# Patient Record
Sex: Female | Born: 2000 | Race: White | Hispanic: No | Marital: Single | Smoking: Never smoker
Health system: Southern US, Community
[De-identification: ages and names within clinical notes are randomized; demographics above are authoritative.]

## PROBLEM LIST (undated history)

## (undated) DIAGNOSIS — G43909 Migraine, unspecified, not intractable, without status migrainosus: Secondary | ICD-10-CM

## (undated) DIAGNOSIS — M6289 Other specified disorders of muscle: Secondary | ICD-10-CM

## (undated) DIAGNOSIS — J45909 Unspecified asthma, uncomplicated: Secondary | ICD-10-CM

## (undated) DIAGNOSIS — N390 Urinary tract infection, site not specified: Secondary | ICD-10-CM

## (undated) DIAGNOSIS — F419 Anxiety disorder, unspecified: Secondary | ICD-10-CM

## (undated) DIAGNOSIS — K219 Gastro-esophageal reflux disease without esophagitis: Secondary | ICD-10-CM

## (undated) DIAGNOSIS — F329 Major depressive disorder, single episode, unspecified: Secondary | ICD-10-CM

## (undated) DIAGNOSIS — F32A Depression, unspecified: Secondary | ICD-10-CM

## (undated) DIAGNOSIS — F319 Bipolar disorder, unspecified: Secondary | ICD-10-CM

## (undated) HISTORY — PX: UPPER GI ENDOSCOPY: SHX6162

---
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
## 2018-01-08 ENCOUNTER — Encounter: Payer: Self-pay | Admitting: Physical Therapy

## 2018-01-08 ENCOUNTER — Ambulatory Visit: Payer: Medicaid Other | Admitting: Physical Therapy

## 2018-01-08 DIAGNOSIS — M6281 Muscle weakness (generalized): Secondary | ICD-10-CM | POA: Diagnosis not present

## 2018-01-08 DIAGNOSIS — R293 Abnormal posture: Secondary | ICD-10-CM

## 2018-01-08 DIAGNOSIS — R252 Cramp and spasm: Secondary | ICD-10-CM

## 2018-01-08 NOTE — Therapy (Signed)
[facility] [address], [address] [place], [place], [address] Phone: [phone]   Fax:  [phone]  Physical Therapy Treatment  Patient Details  Name: Alexis [name] MRN: [id number] Date of Birth: 11/03/2000 Referring Provider[facility]   Encounter Date: 01/08/2018  PT End of Session - 01/08/18 1058    Visit Number  3    Date for PT Re-Evaluation  02/06/18    Authorization Type  Medicaid    Authorization Time Period  12/17/2017-02/10/2018     Authorization - Visit Number  3    Authorization - Number of Visits  18    PT Start Time  1015    PT Stop Time  1055    PT Time Calculation (min)  40 min    Activity Tolerance  Patient tolerated treatment well    Behavior During Therapy  [facility] for tasks assessed/performed       History reviewed. No pertinent past medical history.  History reviewed. No pertinent surgical history.  There were no vitals filed for this visit.  Subjective Assessment - 01/08/18 1021    Subjective  I feel better.  I have less burning. I do not have to take the periduim for awhile.  My back pain is 60% better.  Still have abdomenal pain.     Patient Stated Goals  reduce pain    Currently in Pain?  Yes    Pain Score  8    low is 4/10   Pain Location  Abdomen    Pain Orientation  Mid    Pain Descriptors / Indicators  Aching    Pain Type  Chronic pain    Pain Onset  More than a month ago    Pain Frequency  Intermittent    Aggravating Factors   sitting for long for periods, eating acidic foods    Pain Relieving Factors  heat, mediation, diet    Multiple Pain Sites  No                       [facility] Adult PT Treatment/Exercise - 01/08/18 0001      Therapeutic Activites    Therapeutic Activities  Other Therapeutic Activities    Other Therapeutic Activities  toileting technique to relax the pelvic floor      Neuro Re-ed    Neuro Re-ed Details   diaphragmatic breathing with tactile  cues to expand the abdomen in sitting and supine to relax the pelvic floor      Manual Therapy   Manual Therapy  Soft tissue mobilization;Myofascial release    Soft tissue mobilization  circular massage to the abdomen    Myofascial Release  release through the 3 planes of fascia  for diaphgram, mid abdominal and urogenital             PT Education - 01/08/18 1058    Education Details  diaphgramatic breathing, toileting technique    Person(s) Educated  Patient    Methods  Explanation;Demonstration;Verbal cues;Handout    Comprehension  Verbalized understanding;Returned demonstration       PT Short Term Goals - 01/08/18 1024      PT SHORT TERM GOAL #1   Title  independent with initial HEP    Time  4    Period  Weeks    Status  Achieved      PT SHORT TERM GOAL #2   Title  understand how to toilet correctly to reduce strain on  the pelvic floor    Time  4    Period  Weeks    Status  Achieved        PT Long Term Goals - 12/12/17 0910      PT LONG TERM GOAL #1   Title  independent with HEP    Time  8    Period  Weeks    Status  New    Target Date  02/06/18      PT LONG TERM GOAL #2   Title  reduce pain with vaginal penetration with exam and penile </= 3/10    Baseline  pain level 6/10    Time  8    Period  Weeks    Status  New    Target Date  02/06/18      PT LONG TERM GOAL #3   Title  burining with urination decreased >/= 75% due to ability to relax the pelvic floor    Time  8    Period  Weeks    Status  New    Target Date  02/06/18      PT LONG TERM GOAL #4   Title  reduce straining with bowel movement >/= 75% due to relaxation of pelvic floor muscles    Time  8    Period  Weeks    Status  New    Target Date  02/06/18            Plan - 01/08/18 1032    Clinical Impression Statement  Patient reports sitting for long periods is 40% better.  Patient reports burning with urination is 70% better.  After therapy the abdominal pain decreased from 6/10  to 5/10.  Patient has alot of tenderness and tightness in the suprapubic area.  Patient is doing her HEP.  Patient needed many verbal and tactile cues to perform expansion of her abdomen during diaphgramatic breathing.  Patient will benefi tfrom skilled therapy to reduce pain, rlax pelvic floor muscles and improve core strength.     Rehab Potential  Excellent    Clinical Impairments Affecting Rehab Potential  none    PT Frequency  2x / week    PT Duration  8 weeks    PT Treatment/Interventions  Biofeedback;Cryotherapy;Electrical Stimulation;Moist Heat;Ultrasound;Therapeutic exercise;Therapeutic activities;Neuromuscular re-education;Patient/family education;Manual techniques;Dry needling    PT Next Visit Plan  abdominal soft tissue work over the bladder; ; postural education    PT Home Exercise Plan  ACCESS [facility]: [email or web address]    Consulted and Agree with Plan of Care  Patient       Patient will benefit from skilled therapeutic intervention in order to improve the following deficits and impairments:  Increased fascial restricitons, Pain, Decreased mobility, Increased muscle spasms, Postural dysfunction, Decreased activity tolerance, Decreased range of motion, Decreased strength  Visit Diagnosis: Muscle weakness (generalized)  Cramp and spasm  Abnormal posture     Problem List There are no active problems to display for this patient.   Eulis FosterCheryl Alamin Mccuiston, PT 01/08/18 11:02 AM   [facility] [address], [address] [place], [place], [address] Phone: [phone]   Fax:  [phone]  Name: Alexis [name] MRN: [id number] Date of Birth: 11/26/2000

## 2018-01-08 NOTE — Patient Instructions (Addendum)
About Abdominal Massage  Abdominal massage, also called external colon massage, is a self-treatment circular massage technique that can reduce and eliminate gas and ease constipation. The colon naturally contracts in waves in a clockwise direction starting from inside the right hip, moving up toward the ribs, across the belly, and down inside the left hip.  When you perform circular abdominal massage, you help stimulate your colon's normal wave pattern of movement called peristalsis.  It is most beneficial when done after eating.  Positioning You can practice abdominal massage with oil while lying down, or in the shower with soap.  Some people find that it is just as effective to do the massage through clothing while sitting or standing.  How to Massage Start by placing your finger tips or knuckles on your right side, just inside your hip bone.  . Make small circular movements while you move upward toward your rib cage.   . Once you reach the bottom right side of your rib cage, take your circular movements across to the left side of the bottom of your rib cage.  . Next, move downward until you reach the inside of your left hip bone.  This is the path your feces travel in your colon. . Continue to perform your abdominal massage in this pattern for 10 minutes each day.     You can apply as much pressure as is comfortable in your massage.  Start gently and build pressure as you continue to practice.  Notice any areas of pain as you massage; areas of slight pain may be relieved as you massage, but if you have areas of significant or intense pain, consult with your healthcare provider.  Other Considerations . General physical activity including bending and stretching can have a beneficial massage-like effect on the colon.  Deep breathing can also stimulate the colon because breathing deeply activates the same nervous system that supplies the colon.   . Abdominal massage should always be used in  combination with a bowel-conscious diet that is high in the proper type of fiber for you, fluids (primarily water), and a regular exercise program. [facility] [address], [address] [place], [place] [address] Phone # [phone] Fax [phone]  

## 2018-01-12 ENCOUNTER — Encounter: Payer: Medicaid Other | Admitting: Physical Therapy

## 2018-01-14 ENCOUNTER — Encounter: Payer: Medicaid Other | Admitting: Physical Therapy

## 2018-01-15 ENCOUNTER — Encounter: Payer: Medicaid Other | Admitting: Physical Therapy

## 2018-01-19 ENCOUNTER — Encounter: Payer: Self-pay | Admitting: Physical Therapy

## 2018-01-19 ENCOUNTER — Encounter: Payer: Medicaid Other | Admitting: Physical Therapy

## 2018-01-19 ENCOUNTER — Ambulatory Visit: Payer: Medicaid Other | Admitting: Physical Therapy

## 2018-01-19 DIAGNOSIS — M6281 Muscle weakness (generalized): Secondary | ICD-10-CM | POA: Diagnosis not present

## 2018-01-19 DIAGNOSIS — R293 Abnormal posture: Secondary | ICD-10-CM

## 2018-01-19 DIAGNOSIS — R252 Cramp and spasm: Secondary | ICD-10-CM

## 2018-01-19 NOTE — Therapy (Signed)
[facility] [address], [address] [place], [place], [address] Phone: [phone]   Fax:  [phone]  Physical Therapy Treatment  Patient Details  Name: Ladoris GeneCasey M Redfield MRN: [id number] Date of Birth: 04/01/2001 Referring Provider: Melanee [facility]Brie Fleck [facility]   Encounter Date: 01/19/2018  PT End of Session - 01/19/18 1514    Visit Number  4    Date for PT Re-Evaluation  02/06/18    Authorization Type  Medicaid    Authorization Time Period  12/17/2017-02/10/2018     Authorization - Visit Number  4    Authorization - Number of Visits  18    PT Start Time  1445    PT Stop Time  1523    PT Time Calculation (min)  38 min    Activity Tolerance  Patient tolerated treatment well    Behavior During Therapy  [facility] for tasks assessed/performed       History reviewed. No pertinent past medical history.  History reviewed. No pertinent surgical history.  There were no vitals filed for this visit.  Subjective Assessment - 01/19/18 1448    Subjective  I have not had stomach pain.  I am able to poop better with the exercises. Main problem is pain with intercourse and burning with urination.     Patient Stated Goals  reduce pain    Currently in Pain?  No/denies    Multiple Pain Sites  No         [facility] PT Assessment - 01/19/18 0001      Strength   Right Hip ABduction  3/5    Left Hip ABduction  3/5                   [facility] Adult PT Treatment/Exercise - 01/19/18 0001      Knee/Hip Exercises: Sidelying   Hip ABduction  Strengthening;Right;Left;1 set;10 reps;Limitations    Hip ABduction Limitations  tactile cues to prevent hip rolling backward      Shoulder Exercises: Supine   Horizontal ABduction  Strengthening;Both;15 reps;Theraband    Theraband Level (Shoulder Horizontal ABduction)  Level 1 (Yellow)    Horizontal ABduction Limitations  tried sitting but compensated too much      Manual Therapy   Manual Therapy  Myofascial  release    Myofascial Release  release through the 3 planes of fascia  for, mid abdominal and urogenital             PT Education - 01/19/18 1513    Education Details  interscapular strength for posure    Person(s) Educated  Patient    Methods  Explanation;Demonstration;Verbal cues;Handout    Comprehension  Verbalized understanding;Returned demonstration       PT Short Term Goals - 01/08/18 1024      PT SHORT TERM GOAL #1   Title  independent with initial HEP    Time  4    Period  Weeks    Status  Achieved      PT SHORT TERM GOAL #2   Title  understand how to toilet correctly to reduce strain on the pelvic floor    Time  4    Period  Weeks    Status  Achieved        PT Long Term Goals - 01/19/18 1449      PT LONG TERM GOAL #1   Title  independent with HEP    Baseline  still learning    Time  8  Period  Weeks    Status  On-going      PT LONG TERM GOAL #2   Title  reduce pain with vaginal penetration with exam and penile </= 3/10    Baseline  pain level 2/10    Time  8    Period  Weeks    Status  Achieved      PT LONG TERM GOAL #3   Title  burining with urination decreased >/= 75% due to ability to relax the pelvic floor    Baseline  burning pain 3-4/10; depends on what she eats; 50% better    Time  8    Period  Weeks    Status  On-going      PT LONG TERM GOAL #4   Title  reduce straining with bowel movement >/= 75% due to relaxation of pelvic floor muscles    Time  8    Period  Weeks    Status  Achieved            Plan - 01/19/18 1514    Clinical Impression Statement  Bilateral hip abduction strength is 3/5 and is learning exercises to increase strength. Burning with urination is 50% better and it depends on her foods. Patient is able to use the exercises to have bowel movments without straining.  Patient is doing better with breathing  in filling up with air in supine and sitting down.   Patient will benefit frim skilled therapy to reduce  pain , relax pelvic floor muscles and imporve core sttength.     Rehab Potential  Excellent    Clinical Impairments Affecting Rehab Potential  none    PT Frequency  2x / week    PT Duration  8 weeks    PT Treatment/Interventions  Biofeedback;Cryotherapy;Electrical Stimulation;Moist Heat;Ultrasound;Therapeutic exercise;Therapeutic activities;Neuromuscular re-education;Patient/family education;Manual techniques;Dry needling    PT Next Visit Plan  hip abduction strength; bridge with knees out    PT Home Exercise Plan  ACCESS CODE: [facility]    Consulted and Agree with Plan of Care  Patient       Patient will benefit from skilled therapeutic intervention in order to improve the following deficits and impairments:  Increased fascial restricitons, Pain, Decreased mobility, Increased muscle spasms, Postural dysfunction, Decreased activity tolerance, Decreased range of motion, Decreased strength  Visit Diagnosis: Muscle weakness (generalized)  Cramp and spasm  Abnormal posture     Problem List There are no active problems to display for this patient.   Eulis Foster, PT 01/19/18 3:23 PM   [facility] [address], [address] [place], [place], [address] Phone: [phone]   Fax:  [phone]  Name: HESTER JOSLIN MRN: [id number] Date of Birth: 08/06/2000

## 2018-01-19 NOTE — Patient Instructions (Addendum)
Sash    On back, knees bent, feet flat, left hand on left hip, right hand above left. Pull right arm DIAGONALLY (hip to shoulder) across chest. Bring right arm along head toward floor. Hold momentarily. Slowly return to starting position. Repeat _10__ times each way. Do with left arm. Band color __yellow____   Copyright  VHI. All rights reserved.   Side Pull: Double Arm    On back, knees bent, feet flat. Arms perpendicular to body, shoulder level, elbows straight but relaxed. Pull arms out to sides, elbows straight. Resistance band comes across collarbones, hands toward floor. Hold momentarily. Slowly return to starting position. Repeat _10__ times. Band color _yellow band____    Copyright  VHI. All rights reserved.  Over Head Pull: Wide Grip    On back, knees bent, feet flat, band across thighs, elbows straight but relaxed. Pull hands apart (start). Keeping elbows straight, bring arms up and over head, hands toward floor. Keep steady pull on band. Hold momentarily. Return slowly, keeping pull steady, back to start. Repeat _10__ times. Band color _yellow_____    Copyright  VHI. All rights reserved.  [facility] [address], [address] [place], [place] [address] Phone # [phone] Fax [phone]

## 2018-01-20 ENCOUNTER — Encounter: Payer: Medicaid Other | Admitting: Physical Therapy

## 2018-01-21 ENCOUNTER — Encounter: Payer: Medicaid Other | Admitting: Physical Therapy

## 2018-01-28 ENCOUNTER — Encounter: Payer: Self-pay | Admitting: Physical Therapy

## 2018-01-28 ENCOUNTER — Ambulatory Visit: Payer: Medicaid Other | Attending: Urology | Admitting: Physical Therapy

## 2018-01-28 DIAGNOSIS — M6281 Muscle weakness (generalized): Secondary | ICD-10-CM | POA: Insufficient documentation

## 2018-01-28 DIAGNOSIS — R252 Cramp and spasm: Secondary | ICD-10-CM | POA: Diagnosis present

## 2018-01-28 DIAGNOSIS — R293 Abnormal posture: Secondary | ICD-10-CM | POA: Diagnosis present

## 2018-01-28 NOTE — Therapy (Signed)
[facility] [address], [address] [place], [place], [address] Phone: [phone]   Fax:  [phone]  Physical Therapy Treatment  Patient Details  Name: Alexis [name] MRN: [id number] Date of Birth: 2000/06/15 Referring Provider: Melanee Spry [facility]   Encounter Date: 01/28/2018  PT End of Session - 01/28/18 1614    Visit Number  5    Date for PT Re-Evaluation  02/06/18    Authorization Type  Medicaid    Authorization Time Period  12/17/2017-02/10/2018     Authorization - Visit Number  5    Authorization - Number of Visits  18    PT Start Time  1530    PT Stop Time  1612    PT Time Calculation (min)  42 min    Activity Tolerance  Patient tolerated treatment well    Behavior During Therapy  [facility] for tasks assessed/performed       History reviewed. No pertinent past medical history.  History reviewed. No pertinent surgical history.  There were no vitals filed for this visit.  Subjective Assessment - 01/28/18 1534    Subjective  I had alot of burning and I have not stuck to my diet. Sitting with 60% better pain.     Patient Stated Goals  reduce pain    Currently in Pain?  Yes    Pain Score  7     Pain Orientation  Mid    Pain Descriptors / Indicators  Aching    Pain Type  Chronic pain    Pain Onset  More than a month ago    Pain Frequency  Intermittent    Aggravating Factors   sitting for long periods of time    Pain Relieving Factors  heat, medication, diet    Multiple Pain Sites  No         [facility] PT Assessment - 01/28/18 0001      Strength   Right Hip Flexion  4+/5    Right Hip External Rotation   4/5    Right Hip Internal Rotation  5/5    Left Hip Flexion  4+/5    Left Hip External Rotation  4/5    Left Hip Internal Rotation  5/5                   [facility] Adult PT Treatment/Exercise - 01/28/18 0001      Knee/Hip Exercises: Sidelying   Hip ABduction  Strengthening;Right;Left;1 set;10  reps;Limitations      Shoulder Exercises: Supine   Horizontal ABduction  Strengthening;Both;15 reps;Theraband    Theraband Level (Shoulder Horizontal ABduction)  Level 1 (Yellow)    Horizontal ABduction Limitations  tried sitting but compensated too much      Manual Therapy   Manual Therapy  Myofascial release    Myofascial Release  to bilateral hip adductors and perineum              PT Education - 01/28/18 1611    Education Details  perineal massage; Access Code: [facility]    Person(s) Educated  Patient    Methods  Explanation;Demonstration;Verbal cues;Handout    Comprehension  Returned demonstration;Verbalized understanding       PT Short Term Goals - 01/08/18 1024      PT SHORT TERM GOAL #1   Title  independent with initial HEP    Time  4    Period  Weeks    Status  Achieved      PT SHORT  TERM GOAL #2   Title  understand how to toilet correctly to reduce strain on the pelvic floor    Time  4    Period  Weeks    Status  Achieved        PT Long Term Goals - 01/28/18 1626      PT LONG TERM GOAL #1   Title  independent with HEP    Baseline  still learning    Time  8    Period  Weeks    Status  On-going      PT LONG TERM GOAL #2   Title  reduce pain with vaginal penetration with exam and penile </= 3/10    Baseline  pain level 2/10    Time  8    Period  Weeks    Status  Achieved      PT LONG TERM GOAL #3   Title  burining with urination decreased >/= 75% due to ability to relax the pelvic floor    Baseline  burning pain 3-4/10; depends on what she eats; 50% better    Time  8    Period  Weeks    Status  On-going      PT LONG TERM GOAL #4   Title  reduce straining with bowel movement >/= 75% due to relaxation of pelvic floor muscles    Time  8    Period  Weeks    Status  Achieved            Plan - 01/28/18 1536    Clinical Impression Statement  Sitting for long periods is 60% better.  Patient has had increased buring with urination due to  eating spicy foods.  Patient reports pain with intercourse with initial penetration and deeper.  Patient aloowed therapist to perform myofascial release to the inner thigh. Patient was educated on how to perform self perineal massage. Patient still has difficulty with shoulder exercises using the yellow band.  Patient will benefi tfrom skilled therapy to reduce pain, relax pelvic floor muscles and improve core strength.     Rehab Potential  Excellent    Clinical Impairments Affecting Rehab Potential  none    PT Frequency  2x / week    PT Duration  8 weeks    PT Treatment/Interventions  Biofeedback;Cryotherapy;Electrical Stimulation;Moist Heat;Ultrasound;Therapeutic exercise;Therapeutic activities;Neuromuscular re-education;Patient/family education;Manual techniques;Dry needling    PT Next Visit Plan  see how soft tissue work is doing, work on Chemical engineer. REnewal and put in for medicaid    PT Home Exercise Plan  ACCESS CODE: [facility]    Consulted and Agree with Plan of Care  Patient       Patient will benefit from skilled therapeutic intervention in order to improve the following deficits and impairments:  Increased fascial restricitons, Pain, Decreased mobility, Increased muscle spasms, Postural dysfunction, Decreased activity tolerance, Decreased range of motion, Decreased strength  Visit Diagnosis: Muscle weakness (generalized)  Cramp and spasm  Abnormal posture     Problem List There are no active problems to display for this patient.   Eulis Foster, PT 01/28/18 4:28 PM    [facility] [address] [place], [place], [address] Phone: [phone]   Fax:  [phone]  Name: JAMYE THEUNE MRN: [id number] Date of Birth: May 16, 2001

## 2018-01-28 NOTE — Patient Instructions (Addendum)
STRETCHING THE PELVIC FLOOR MUSCLES NO DILATOR  Supplies . Vaginal lubricant . Mirror (optional) . Gloves (optional) Positioning . Start in a semi-reclined position with your head propped up. Bend your knees and place your thumb or finger at the vaginal opening. Procedure . Apply a moderate amount of lubricant on the outer skin of your vagina, the labia minora.  Apply additional lubricant to your finger. Marland Kitchen Spread the skin away from the vaginal opening. Place the end of your finger at the opening. . Do a maximum contraction of the pelvic floor muscles. Tighten the vagina and the anus maximally and relax. . When you know they are relaxed, gently and slowly insert your finger into your vagina, directing your finger slightly downward, for 2-3 inches of insertion. . Relax and stretch the 6 o'clock position . Hold each stretch for _2 min__ and repeat __1_ time with rest breaks of _1__ seconds between each stretch. . Repeat the stretching in the 4 o'clock and 8 o'clock positions. . Total time should be _6__ minutes, _1__ x per day.  Note the amount of theme your were able to achieve and your tolerance to your finger in your vagina. . Once you have accomplished the techniques you may try them in standing with one foot resting on the tub, or in other positions.  This is a good stretch to do in the shower if you don't need to use lubricant.  [facility] [address], [address] [place], [place] [address] Phone # [phone] Fax [phone]  Access Code: [facility]  Supine Shoulder Horizontal Abduction with Resistance - 10 reps - 1 sets - 1x daily - 7x weekly  Sidelying Hip Abduction with Ankle Weight - 10 reps - 1 sets - 1x daily - 7x weekly

## 2018-02-04 ENCOUNTER — Ambulatory Visit: Payer: Medicaid Other | Admitting: Physical Therapy

## 2018-02-09 ENCOUNTER — Encounter: Payer: Self-pay | Admitting: Physical Therapy

## 2018-02-09 ENCOUNTER — Ambulatory Visit: Payer: Medicaid Other | Admitting: Physical Therapy

## 2018-02-09 ENCOUNTER — Encounter: Payer: Medicaid Other | Admitting: Physical Therapy

## 2018-02-09 DIAGNOSIS — R293 Abnormal posture: Secondary | ICD-10-CM

## 2018-02-09 DIAGNOSIS — M6281 Muscle weakness (generalized): Secondary | ICD-10-CM

## 2018-02-09 DIAGNOSIS — R252 Cramp and spasm: Secondary | ICD-10-CM

## 2018-02-09 NOTE — Therapy (Signed)
[facility] [address], [address] [place], [place], [address] Phone: [phone]   Fax:  [phone]  Physical Therapy Treatment  Patient Details  Name: Alexis [name] MRN: [id number] Date of Birth: 01/12/2001 Referring Provider: Burman FosterBree Tharpe Fleck NP   Encounter Date: 02/09/2018  PT End of Session - 02/09/18 0808    Visit Number  6    Date for PT Re-Evaluation  05/01/18    Authorization Type  Medicaid    Authorization Time Period  12/17/2017-02/10/2018     Authorization - Visit Number  6    Authorization - Number of Visits  18    PT Start Time  0800    PT Stop Time  0845    PT Time Calculation (min)  45 min    Activity Tolerance  Patient tolerated treatment well    Behavior During Therapy  [name] for tasks assessed/performed       History reviewed. No pertinent past medical history.  History reviewed. No pertinent surgical history.  There were no vitals filed for this visit.  Subjective Assessment - 02/09/18 0801    Subjective  I have been hurting lately and having burning.  I am seeing the urologist today. I see them at [facility].     Patient Stated Goals  reduce pain    Currently in Pain?  Yes    Pain Score  4     Pain Location  Abdomen    Pain Orientation  Mid    Pain Descriptors / Indicators  Aching    Pain Type  Chronic pain    Pain Onset  More than a month ago    Pain Frequency  Constant    Aggravating Factors   sitting in same spot for long time, standing for a long time, pants too tight around the waist    Pain Relieving Factors  heat, medication, diet    Multiple Pain Sites  Yes    Pain Score  8    Pain Location  Vagina    Pain Orientation  Mid    Pain Descriptors / Indicators  Burning    Pain Type  Acute pain    Pain Onset  More than a month ago    Pain Frequency  Intermittent    Aggravating Factors   urination and when bladder is full    Pain Relieving Factors  medication some times, heat          [facility] PT Assessment - 02/09/18 0001      Assessment   Medical Diagnosis  N30.20 Chronic cystitis    Referring Provider  Burman FosterBree Tharpe Fleck NP    Onset Date/Surgical Date  01/25/17    Prior Therapy  none      Precautions   Precautions  None      Restrictions   Weight Bearing Restrictions  No      Balance Screen   Has the patient fallen in the past 6 months  No    Has the patient had a decrease in activity level because of a fear of falling?   No    Is the patient reluctant to leave their home because of a fear of falling?   No      Home Public house managernvironment   Living Environment  Private residence      Prior Function   Level of Independence  Independent    Vocation  Student    Leisure  none      Cognition  Overall Cognitive Status  Within Functional Limits for tasks assessed      Posture/Postural Control   Posture/Postural Control  Postural limitations    Postural Limitations  Rounded Shoulders;Forward head;Decreased thoracic kyphosis;Posterior pelvic tilt;Flexed trunk      ROM / Strength   AROM / PROM / Strength  AROM;PROM;Strength      AROM   Lumbar Extension  decreased by 50%      Strength   Overall Strength Comments  back extensor stregnth is 3/5    Right Hip Flexion  4+/5    Right Hip External Rotation   4/5    Right Hip Internal Rotation  5/5    Right Hip ABduction  4-/5    Left Hip Flexion  4+/5    Left Hip External Rotation  4/5    Left Hip Internal Rotation  5/5    Left Hip ABduction  4-/5      Palpation   SI assessment   ASIS is equal      Transfers   Transfers  Independent with all Transfers                Pelvic Floor Special Questions - 02/09/18 0001    Currently Sexually Active  Yes    Is this Painful  Yes    Marinoff Scale  discomfort that does not affect completion    Urinary urgency  No    Fecal incontinence  No    Exam Type  Deferred        [facility] Adult PT Treatment/Exercise - 02/09/18 0001      Therapeutic Activites     Therapeutic Activities  ADL's    ADL's  sit up tall, no crossing legs, feet flat on floor,       Neuro Re-ed    Neuro Re-ed Details   roll ball forward and backward working on pelvic tilt and dissociation of pelvis from spine, pelvic sway, pelvic circles, diagonals      Lumbar Exercises: Prone   Opposite Arm/Leg Raise  Right arm/Left leg;Left arm/Right leg;10 reps;5 seconds    Other Prone Lumbar Exercises  prone press up 8 times hold 5 seconds    Other Prone Lumbar Exercises  lat stretch with increased thoracic extension      Shoulder Exercises: Supine   Horizontal ABduction  Strengthening;Both;15 reps;Theraband    Theraband Level (Shoulder Horizontal ABduction)  Level 1 (Yellow)   supine   Diagonals  Strengthening;Right;Left;15 reps;Theraband    Theraband Level (Shoulder Diagonals)  Level 1 (Yellow)               PT Short Term Goals - 01/08/18 1024      PT SHORT TERM GOAL #1   Title  independent with initial HEP    Time  4    Period  Weeks    Status  Achieved      PT SHORT TERM GOAL #2   Title  understand how to toilet correctly to reduce strain on the pelvic floor    Time  4    Period  Weeks    Status  Achieved        PT Long Term Goals - 02/09/18 6962      PT LONG TERM GOAL #1   Title  independent with HEP    Baseline  still learning    Time  8    Period  Weeks    Status  On-going      PT LONG TERM GOAL #2  Title  reduce pain with vaginal penetration with exam and penile </= 3/10    Baseline  pain level 2/10    Time  8    Period  Weeks    Status  Achieved      PT LONG TERM GOAL #3   Title  burining with urination decreased >/= 75% due to ability to relax the pelvic floor    Baseline  burning pain 8/10; depends on what she eats;     Time  8    Period  Weeks    Status  On-going      PT LONG TERM GOAL #4   Title  reduce straining with bowel movement >/= 75% due to relaxation of pelvic floor muscles    Time  8    Period  Weeks    Status   Achieved      PT LONG TERM GOAL #5   Title  sitting for 30 minutes wiith pain level </3-4/10 due to decreased muscle tension     Baseline  pain level is 8/10 when sitting for 30 minutes    Time  12    Period  Weeks    Status  New    Target Date  05/04/18            Plan - 02/09/18 0825    Clinical Impression Statement  Patient abdominal constant pai nis 4/10 with prolonged sitting and standing longer than 30 minutes, tight waisted pants.  Patient buring in the vaginal area is 8/10 with urination, when the bladder is full, and sitting too long.  Marinoff score is now 1/3 instead of 2/3.  Pelvis is now in correct alignment.  Bilateral hip strength is 4/5 with exception of abduction 4-/5.  Lumbar extension decreased by 50%.  Back extensor is 3/5.  Difficulty with separating her pelvic from the spine.  Patient does not have to strain to have a bowel movement now.  Patient is still having difficulty with her posture with increased thoracic kyphosis and slouching.  Patient will benefit from skilled therapy to reduce pain, relax pelvic floor muscles and improve core strength.     Rehab Potential  Excellent    Clinical Impairments Affecting Rehab Potential  none    PT Frequency  1x / week    PT Duration  12 weeks    PT Treatment/Interventions  Biofeedback;Cryotherapy;Electrical Stimulation;Moist Heat;Ultrasound;Therapeutic exercise;Therapeutic activities;Neuromuscular re-education;Patient/family education;Manual techniques;Dry needling    PT Next Visit Plan  see how soft tissue work is doing, work on Chemical engineer. See what urologist says.     PT Home Exercise Plan  ACCESS CODE: [facility]    Consulted and Agree with Plan of Care  Patient       Patient will benefit from skilled therapeutic intervention in order to improve the following deficits and impairments:  Increased fascial restricitons, Pain, Decreased mobility, Increased muscle spasms, Postural dysfunction, Decreased activity  tolerance, Decreased range of motion, Decreased strength  Visit Diagnosis: Muscle weakness (generalized) - Plan: PT plan of care cert/re-cert  Abnormal posture - Plan: PT plan of care cert/re-cert  Cramp and spasm - Plan: PT plan of care cert/re-cert     Problem List There are no active problems to display for this patient.   Eulis Foster, PT 02/09/18 8:46 AM   [facility] [address], [address] [place], [place], [address] Phone: [phone]   Fax:  [phone]  Name: Alexis [name] MRN: [id number] Date of Birth:  08/12/2000   

## 2018-03-02 ENCOUNTER — Encounter: Payer: Self-pay | Admitting: Physical Therapy

## 2018-03-02 ENCOUNTER — Ambulatory Visit: Payer: Medicaid Other | Attending: Urology | Admitting: Physical Therapy

## 2018-03-02 DIAGNOSIS — M6281 Muscle weakness (generalized): Secondary | ICD-10-CM | POA: Insufficient documentation

## 2018-03-02 DIAGNOSIS — R252 Cramp and spasm: Secondary | ICD-10-CM | POA: Diagnosis present

## 2018-03-02 DIAGNOSIS — R293 Abnormal posture: Secondary | ICD-10-CM | POA: Insufficient documentation

## 2018-03-02 NOTE — Patient Instructions (Addendum)
Guided Meditation for Pelvic Floor Relaxation  [employer]    [facility] [address], [address] [place], [place] [address] Phone # [phone] Fax [phone]  

## 2018-03-02 NOTE — Therapy (Signed)
[facility] [address] [place], [place], [address] Phone: [phone]   Fax:  [phone]  Physical Therapy Treatment  Patient Details  Name: Alexis [name] MRN: [id number] Date of Birth: [birth year] Referring Provider (PT): Burman Foster NP   Encounter Date: 03/02/2018  PT End of Session - 03/02/18 1442    Visit Number  7    Date for PT Re-Evaluation  05/01/18    Authorization Type  Medicaid    Authorization Time Period  12/17/2017-02/10/2018     Authorization - Visit Number  7    Authorization - Number of Visits  18    PT Start Time  1445    PT Stop Time  1540    PT Time Calculation (min)  55 min    Activity Tolerance  Patient tolerated treatment well    Behavior During Therapy  [facility] for tasks assessed/performed       History reviewed. No pertinent past medical history.  History reviewed. No pertinent surgical history.  There were no vitals filed for this visit.  Subjective Assessment - 03/02/18 1444    Subjective  The exercises help my backand constipation  but not the bladder I am in alot of pain for the past few weeks. I am seeing the doctor for the bladder instillation. I saw the doctor since the last visit.  They said I have bacteria in her urine but nothing grew.     Patient Stated Goals  reduce pain    Currently in Pain?  Yes    Pain Score  7     Pain Location  Abdomen    Pain Orientation  Mid    Pain Descriptors / Indicators  Aching    Pain Type  Chronic pain    Pain Onset  More than a month ago    Pain Frequency  Constant    Aggravating Factors   sitting same spot for long time, standing for a long time, pants too tight around the waist    Pain Relieving Factors  heat, medicaiton, diet    Multiple Pain Sites  Yes    Pain Score  7    Pain Location  Vagina    Pain Orientation  Mid    Pain Descriptors / Indicators  Burning    Pain Type  Acute pain    Pain Onset  More than a month ago    Pain Frequency  Intermittent    Aggravating Factors   urination and when bladder is full    Pain Relieving Factors  medication some times, heat         [facility] PT Assessment - 03/02/18 0001      Palpation   SI assessment   ASIS is equal                   [facility] Adult PT Treatment/Exercise - 03/02/18 0001      Exercises   Exercises  Lumbar      Lumbar Exercises: Quadruped   Opposite Arm/Leg Raise  Right arm/Left leg;Left arm/Right leg;10 reps;1 second    Opposite Arm/Leg Raise Limitations  able to control her core with increased steadiness and control      Modalities   Modalities  Electrical Stimulation;Moist Heat      Moist Heat Therapy   Number Minutes Moist Heat  15 Minutes    Moist Heat Location  Lumbar Spine   abdominal     Electrical Stimulation  Electrical Stimulation Location  Sacral and lower abdomen    Electrical Stimulation Action  IFC    Electrical Stimulation Parameters  15 min, to patient tolerance    Electrical Stimulation Goals  Pain      Manual Therapy   Manual Therapy  Soft tissue mobilization;Myofascial release    Soft tissue mobilization  lower abdominal tissue to reduce tension     Myofascial Release  released the 3 planes of fascia for the urogenital and rspiratory diaphgram             PT Education - 03/02/18 1524    Education Details  pelvic floor meditation    Person(s) Educated  Patient    Methods  Explanation;Handout    Comprehension  Verbalized understanding       PT Short Term Goals - 01/08/18 1024      PT SHORT TERM GOAL #1   Title  independent with initial HEP    Time  4    Period  Weeks    Status  Achieved      PT SHORT TERM GOAL #2   Title  understand how to toilet correctly to reduce strain on the pelvic floor    Time  4    Period  Weeks    Status  Achieved        PT Long Term Goals - 03/02/18 1529      PT LONG TERM GOAL #1   Title  independent with HEP    Baseline  still learning    Time  8     Period  Weeks    Status  On-going      PT LONG TERM GOAL #2   Title  reduce pain with vaginal penetration with exam and penile </= 3/10    Baseline  pain level 2/10    Time  8    Period  Weeks    Status  Achieved      PT LONG TERM GOAL #3   Title  burining with urination decreased >/= 75% due to ability to relax the pelvic floor    Baseline  burning pain 8/10; depends on what she eats;     Time  8    Period  Weeks    Status  On-going      PT LONG TERM GOAL #4   Title  reduce straining with bowel movement >/= 75% due to relaxation of pelvic floor muscles    Time  8    Status  Achieved      PT LONG TERM GOAL #5   Title  sitting for 30 minutes wiith pain level </3-4/10 due to decreased muscle tension     Baseline  pain level is 8/10 when sitting for 30 minutes    Time  12    Period  Weeks    Status  On-going            Plan - 03/02/18 1525    Clinical Impression Statement  Patient is having a flare-up with her interstitial cystitis and will be going for an instillation to her bladder on Wednesday.  Patient has been doing her exercises. Patient will be starting the pelvic floor meditation to calm the nervous system down and reduce her pain.  Patient is able to perform quadruped to lift alternate extremity with increased trunk control. Patient will benefit from skilled therapy to reduce pain, relax pelvic floor muscles and improve core strength.     Rehab Potential  Excellent  Clinical Impairments Affecting Rehab Potential  none    PT Frequency  1x / week    PT Duration  12 weeks    PT Treatment/Interventions  Biofeedback;Cryotherapy;Electrical Stimulation;Moist Heat;Ultrasound;Therapeutic exercise;Therapeutic activities;Neuromuscular re-education;Patient/family education;Manual techniques;Dry needling    PT Next Visit Plan  see if the instillation has helped, continue with core strength and soft tissue work    PT Home Exercise Plan  ACCESS CODE: [facility]    Consulted and  Agree with Plan of Care  Patient       Patient will benefit from skilled therapeutic intervention in order to improve the following deficits and impairments:  Increased fascial restricitons, Pain, Decreased mobility, Increased muscle spasms, Postural dysfunction, Decreased activity tolerance, Decreased range of motion, Decreased strength  Visit Diagnosis: Muscle weakness (generalized)  Abnormal posture  Cramp and spasm     Problem List There are no active problems to display for this patient.   Eulis Foster, PT 03/02/18 3:30 PM   [facility] [address], [address] [place], [place], [address] Phone: [phone]   Fax:  [phone]  Name: Alexis [name] MRN: [id number] Date of Birth: [birth year]

## 2018-03-11 ENCOUNTER — Encounter: Payer: Self-pay | Admitting: Physical Therapy

## 2018-03-11 ENCOUNTER — Ambulatory Visit: Payer: Medicaid Other | Admitting: Physical Therapy

## 2018-03-11 DIAGNOSIS — R252 Cramp and spasm: Secondary | ICD-10-CM

## 2018-03-11 DIAGNOSIS — R293 Abnormal posture: Secondary | ICD-10-CM

## 2018-03-11 DIAGNOSIS — M6281 Muscle weakness (generalized): Secondary | ICD-10-CM | POA: Diagnosis not present

## 2018-03-11 NOTE — Therapy (Signed)
[facility] [address] [place], [place], [address] Phone: [phone]   Fax:  [phone]  Physical Therapy Treatment  Patient Details  Name: Alexis [name] MRN: [id number] Date of Birth: Jan 24, 2001 Referring Provider (PT): Burman Foster NP   Encounter Date: 03/11/2018  PT End of Session - 03/11/18 1358    Visit Number  8    Date for PT Re-Evaluation  05/01/18    Authorization Type  Medicaid    Authorization Time Period  02/16/18-05/10/18 for 12 visits    Authorization - Visit Number  1    Authorization - Number of Visits  12    PT Start Time  1400    PT Stop Time  1440    PT Time Calculation (min)  40 min    Activity Tolerance  Patient tolerated treatment well    Behavior During Therapy  [facility] for tasks assessed/performed       History reviewed. No pertinent past medical history.  History reviewed. No pertinent surgical history.  There were no vitals filed for this visit.  Subjective Assessment - 03/11/18 1401    Subjective  I feel better. They did the instillation and it helped.   I still have a little pain. When I am getting my period I will flare up some.     Patient Stated Goals  reduce pain    Currently in Pain?  Yes    Pain Score  4     Pain Location  Vagina    Pain Orientation  Mid    Pain Descriptors / Indicators  Aching    Pain Type  Chronic pain    Pain Onset  More than a month ago    Pain Frequency  Constant    Aggravating Factors   when urinate, at night when resting in bed, sometimes in the morning    Pain Relieving Factors  heat, medication, diet    Multiple Pain Sites  No                       [facility] Adult PT Treatment/Exercise - 03/11/18 0001      Neuro Re-ed    Neuro Re-ed Details   roll ball forward and backward working on pelvic tilt and dissociation of pelvis from spine, pelvic sway, pelvic circles, diagonals      Exercises   Exercises  Lumbar      Lumbar  Exercises: Stretches   Lower Trunk Rotation  4 reps;10 seconds    Piriformis Stretch  Right;Left;2 reps;30 seconds   foot on ball   Other Lumbar Stretch Exercise  supine roll green ball back and forth with knees opening up to release the perineal tissue      Manual Therapy   Manual Therapy  Soft tissue mobilization;Myofascial release    Soft tissue mobilization  bilateral inner thighs through her pants    Myofascial Release  released the 3 planes of fascia for the urogenital diaphragm               PT Short Term Goals - 01/08/18 1024      PT SHORT TERM GOAL #1   Title  independent with initial HEP    Time  4    Period  Weeks    Status  Achieved      PT SHORT TERM GOAL #2   Title  understand how to toilet correctly to reduce strain on the pelvic floor  Time  4    Period  Weeks    Status  Achieved        PT Long Term Goals - 03/11/18 1407      PT LONG TERM GOAL #1   Title  independent with HEP    Baseline  still learning    Time  8    Period  Weeks    Status  On-going      PT LONG TERM GOAL #2   Title  reduce pain with vaginal penetration with exam and penile </= 3/10    Baseline  pain level 2/10    Time  8    Period  Weeks    Status  Achieved      PT LONG TERM GOAL #3   Title  burining with urination decreased >/= 75% due to ability to relax the pelvic floor    Baseline  burning pain 8/10; depends on what she eats;     Time  8    Period  Weeks    Status  On-going      PT LONG TERM GOAL #4   Title  reduce straining with bowel movement >/= 75% due to relaxation of pelvic floor muscles    Time  8    Period  Weeks    Status  Achieved      PT LONG TERM GOAL #5   Title  sitting for 30 minutes wiith pain level </3-4/10 due to decreased muscle tension     Baseline  pain level is 6/10 when sitting for 30 minutes    Time  12    Period  Weeks    Status  On-going            Plan - 03/11/18 1405    Clinical Impression Statement  After therapy pain  level decreased to 1/10. Patient pain level with urination is 8/10 and happens constantly.  Sitting for 30 minutes with pain level 6/10. Patient had an instillation to the bladder and her pain level reduced  by 40%. Patient will benefit from skilled therapy to reduce pain, relax pelvic floor muscles and improve core strength.     Rehab Potential  Excellent    Clinical Impairments Affecting Rehab Potential  none    PT Frequency  1x / week    PT Duration  12 weeks    PT Treatment/Interventions  Biofeedback;Cryotherapy;Electrical Stimulation;Moist Heat;Ultrasound;Therapeutic exercise;Therapeutic activities;Neuromuscular re-education;Patient/family education;Manual techniques;Dry needling    PT Next Visit Plan   continue with core strength and soft tissue work ot inner thigh; ball exercise    PT Home Exercise Plan  ACCESS CODE: [facility]    Recommended Other Services  MD signed initial and renewal note    Consulted and Agree with Plan of Care  Patient       Patient will benefit from skilled therapeutic intervention in order to improve the following deficits and impairments:  Increased fascial restricitons, Pain, Decreased mobility, Increased muscle spasms, Postural dysfunction, Decreased activity tolerance, Decreased range of motion, Decreased strength  Visit Diagnosis: Muscle weakness (generalized)  Abnormal posture  Cramp and spasm     Problem List There are no active problems to display for this patient.   Eulis Foster, PT 03/11/18 2:44 PM   [facility] [address] [place], [place], [address] Phone: [phone]   Fax:  [phone]  Name: Alexis [name] MRN: [id number] Date of Birth: [birth year]

## 2018-03-16 ENCOUNTER — Ambulatory Visit: Payer: Medicaid Other | Admitting: Physical Therapy

## 2018-03-19 ENCOUNTER — Other Ambulatory Visit: Payer: Self-pay | Admitting: Urology

## 2018-03-23 ENCOUNTER — Encounter: Payer: Self-pay | Admitting: Physical Therapy

## 2018-03-23 ENCOUNTER — Ambulatory Visit: Payer: Medicaid Other | Admitting: Physical Therapy

## 2018-03-23 DIAGNOSIS — M6281 Muscle weakness (generalized): Secondary | ICD-10-CM | POA: Diagnosis not present

## 2018-03-23 DIAGNOSIS — R252 Cramp and spasm: Secondary | ICD-10-CM

## 2018-03-23 DIAGNOSIS — R293 Abnormal posture: Secondary | ICD-10-CM

## 2018-03-23 NOTE — Therapy (Signed)
[facility] [address] [place], [place], [address] Phone: [phone]   Fax:  [phone]  Physical Therapy Treatment  Patient Details  Name: Alexis [name] MRN: [id number] Date of Birth: Feb 21, 2001 Referring Provider (PT): Burman Foster NP   Encounter Date: 03/23/2018  PT End of Session - 03/23/18 1442    Visit Number  9    Date for PT Re-Evaluation  05/01/18    Authorization Type  Medicaid    Authorization Time Period  02/16/18-05/10/18 for 12 visits    Authorization - Visit Number  2    Authorization - Number of Visits  12    PT Start Time  1400    PT Stop Time  1440    PT Time Calculation (min)  40 min    Activity Tolerance  Patient tolerated treatment well    Behavior During Therapy  [facility] for tasks assessed/performed       History reviewed. No pertinent past medical history.  History reviewed. No pertinent surgical history.  There were no vitals filed for this visit.  Subjective Assessment - 03/23/18 1403    Subjective  I am not feeling as good as last visit. The doctor is planning to do surgery including with dilation of urethera and another procedure. I am having leg soreness. MD wants me to have internal work.     Patient Stated Goals  reduce pain    Currently in Pain?  Yes    Pain Score  6     Pain Location  Vagina    Pain Orientation  Mid    Pain Descriptors / Indicators  Aching    Pain Type  Chronic pain    Pain Onset  More than a month ago    Pain Frequency  Constant    Aggravating Factors   when urinated, at night when resting in bed, sometimes in the morning    Pain Relieving Factors  heat, medication, diet    Multiple Pain Sites  No                    Pelvic Floor Special Questions - 03/23/18 0001    Pelvic Floor Internal Exam  Patient confirmed identication and approves PT to assess muscle and treatment    Exam Type  Vaginal    Strength  good squeeze, good lift, able  to hold agaisnt strong resistance        [facility] Adult PT Treatment/Exercise - 03/23/18 0001      Neuro Re-ed    Neuro Re-ed Details   roll ball forward and backward working on pelvic tilt and dissociation of pelvis from spine, pelvic sway, pelvic circles, diagonals      Manual Therapy   Manual Therapy  Internal Pelvic Floor    Internal Pelvic Floor  release on bil. sides of urethra, release by the perineal body, release of left obturator internist with one hand internally and one hand on left lower quadrant.               PT Education - 03/23/18 1442    Education Details  information about lubricants    Person(s) Educated  Patient    Methods  Explanation;Handout    Comprehension  Verbalized understanding       PT Short Term Goals - 01/08/18 1024      PT SHORT TERM GOAL #1   Title  independent with initial HEP    Time  4  Period  Weeks    Status  Achieved      PT SHORT TERM GOAL #2   Title  understand how to toilet correctly to reduce strain on the pelvic floor    Time  4    Period  Weeks    Status  Achieved        PT Long Term Goals - 03/23/18 1445      PT LONG TERM GOAL #1   Title  independent with HEP    Baseline  still learning    Time  8    Status  On-going      PT LONG TERM GOAL #2   Title  reduce pain with vaginal penetration with exam and penile </= 3/10    Baseline  pain level 2/10    Time  8    Period  Weeks    Status  Achieved      PT LONG TERM GOAL #3   Title  burining with urination decreased >/= 75% due to ability to relax the pelvic floor    Baseline  burning pain 8/10; depends on what she eats;     Time  8    Period  Weeks    Status  On-going      PT LONG TERM GOAL #5   Title  sitting for 30 minutes wiith pain level </3-4/10 due to decreased muscle tension     Baseline  pain level is 6/10 when sitting for 30 minutes    Time  12    Period  Weeks    Status  On-going            Plan - 03/23/18 1442    Clinical Impression  Statement  Patient pain level decreased to 5/10. Patient had many trigger points in the pelvic floor especially on the left side of the urethra and obturator internist.  Patient pain has increased over the week so the doctor has scheduled patient for 2 procedures.  Patient will benefi tfrom skilled therapy to reduce pain, relax pelvic floor muscles and improve core strength.     Rehab Potential  Excellent    Clinical Impairments Affecting Rehab Potential  none    PT Frequency  1x / week    PT Duration  12 weeks    PT Treatment/Interventions  Biofeedback;Cryotherapy;Electrical Stimulation;Moist Heat;Ultrasound;Therapeutic exercise;Therapeutic activities;Neuromuscular re-education;Patient/family education;Manual techniques;Dry needling    PT Next Visit Plan   continue with core strength and soft tissue work to inner perineal area; ball exercise    PT Home Exercise Plan  ACCESS CODE: [facility]    Consulted and Agree with Plan of Care  Patient       Patient will benefit from skilled therapeutic intervention in order to improve the following deficits and impairments:  Increased fascial restricitons, Pain, Decreased mobility, Increased muscle spasms, Postural dysfunction, Decreased activity tolerance, Decreased range of motion, Decreased strength  Visit Diagnosis: Muscle weakness (generalized)  Abnormal posture  Cramp and spasm     Problem List There are no active problems to display for this patient.   Eulis Foster, PT 03/23/18 2:46 PM   [facility] [address], [address] [place], [place], [address] Phone: [phone]   Fax:  [phone]  Name: Alexis [name] MRN: [id number] Date of Birth: [birth year]

## 2018-03-23 NOTE — Patient Instructions (Signed)
  Lubrication . Used for intercourse to reduce friction . Avoid ones that have glycerin, warming gels, tingling gels, icing or cooling gel, scented . Avoid parabens due to a preservative similar to female sex hormone . May need to be reapplied once or several times during sexual activity . Can be applied to both partners genitals prior to vaginal penetration to minimize friction or irritation . Prevent irritation and mucosal tears that cause post coital pain and increased the risk of vaginal and urinary tract infections . Oil-based lubricants cannot be used with condoms due to breaking them down.  Least likely to irritate vaginal tissue.  . Plant based-lubes are safe . Silicone-based lubrication are thicker and last long and used for post-menopausal women  Vaginal Lubricators Here is a list of some suggested lubricators you can use for intercourse. Use the most hypoallergenic product.  You can place on you or your partner.   Slippery Stuff  Sylk or Sliquid Natural H2O ( good  if frequent UTI's)  Blossom Organics (www.blossom-organics.com)  Luvena   Coconut oil  PJur Woman Nude- water based lubricant, amazon  Uberlube- Amazon  Aloe Vera  Yes lubricant- Amazon  Wet Platinum-Silicone, [employer], [facility]  Olive and Bee intimate cream-  www.oliveandbee.com.au Things to avoid in lubricants are glycerin, warming gels, tingling gels, icing or cooling  gels, and scented gels.  Also avoid Vaseline. KY jelly, Replens, and Astroglide kills good bacteria(lactobacilli)  Things to avoid in the vaginal area . Do not use things to irritate the vulvar area . No lotions- see below . No soaps; can use Aveeno or Calendula cleanser if needed. Must be gentle . No deodorants . No douches . Good to sleep without underwear to let the vaginal area to air out . No scrubbing: spread the lips to let warm water rinse over labias and pat dry  Creams that can be used on the Vulva Area  V  magic-amazon  Vital V Wild Yam Salve  Julva- Amazon  MoonMaid Botanical Pro-Meno Wild Yam Cream  Desert Havest Releveum or Desert Harvest Gele    [facility] [address], [address] [place], [place] [address] Phone # [phone] Fax [phone]  

## 2018-04-08 ENCOUNTER — Ambulatory Visit: Payer: Medicaid Other | Attending: Urology | Admitting: Physical Therapy

## 2018-04-08 ENCOUNTER — Encounter (HOSPITAL_BASED_OUTPATIENT_CLINIC_OR_DEPARTMENT_OTHER): Payer: Self-pay

## 2018-04-08 ENCOUNTER — Encounter: Payer: Self-pay | Admitting: Physical Therapy

## 2018-04-08 ENCOUNTER — Other Ambulatory Visit: Payer: Self-pay

## 2018-04-08 DIAGNOSIS — R252 Cramp and spasm: Secondary | ICD-10-CM | POA: Diagnosis present

## 2018-04-08 DIAGNOSIS — M6281 Muscle weakness (generalized): Secondary | ICD-10-CM | POA: Diagnosis not present

## 2018-04-08 DIAGNOSIS — R293 Abnormal posture: Secondary | ICD-10-CM | POA: Insufficient documentation

## 2018-04-08 NOTE — Patient Instructions (Signed)
Butterfly, Supine    Lie on back, feet together. Lower knees toward floor. Hold __2 min Repeat _1__ times per session. Do 1-2___ sessions per day.  Copyright  VHI. All rights reserved.  Posterior Hip: Wall Slide    Lie on floor with back of legs on wall. Put right ankle on other thigh. Slide opposite foot down wall until stretch is felt in back of hip. Hold _60__ seconds. Relax. Repeat __1_ times. Do __1_ times a day. Repeat on other leg.    Copyright  VHI. All rights reserved.  Hip Adductor: Wall Stretch    Lie on back with hips against wall, back of thighs on wall. Pull legs apart until stretch is felt in inner thighs. Hold _2 min. Relax. Repeat _1__ times. Do __1_ times a day. Advanced: At end of stretch, rotate thighs outward.  Copyright  VHI. All rights reserved.  [facility] [address], [address] [place], [place] [address] Phone # [phone] Fax [phone]

## 2018-04-08 NOTE — Therapy (Signed)
[facility] [address], [address] [place], [place], [address] Phone: [phone]   Fax:  [phone]  Physical Therapy Treatment  Patient Details  Name: Alexis GeneCasey M [name] MRN: [id number] Date of Birth: 07/11/2000 Referring Provider (PT): Burman FosterBree Tharpe Fleck NP   Encounter Date: 04/08/2018  PT End of Session - 04/08/18 1445    Visit Number  10    Date for PT Re-Evaluation  05/01/18    Authorization Type  Medicaid    Authorization Time Period  02/16/18-05/10/18 for 12 visits    Authorization - Visit Number  3    Authorization - Number of Visits  12    PT Start Time  1400    PT Stop Time  1500    PT Time Calculation (min)  60 min    Activity Tolerance  Patient tolerated treatment well;No increased pain    Behavior During Therapy  WFL for tasks assessed/performed       Past Medical History:  Diagnosis Date  . Anxiety   . Asthma   . Depression   . GERD (gastroesophageal reflux disease)   . Migraines     Past Surgical History:  Procedure Laterality Date  . UPPER GI ENDOSCOPY      There were no vitals filed for this visit.  Subjective Assessment - 04/08/18 1410    Subjective  The vaginal soft tissue work helped last time. I am on my cycle today. I have hydrodistention surgery on 04/10/2018. I will be looking for a gynecologist.     Patient Stated Goals  reduce pain    Currently in Pain?  Yes    Pain Score  7     Pain Location  Vagina    Pain Orientation  Mid    Pain Descriptors / Indicators  Aching    Pain Type  Chronic pain    Pain Onset  More than a month ago    Pain Frequency  Constant    Aggravating Factors   when urinating, at night when resting in bed, sometimes in the morning    Pain Relieving Factors  heat, medication, diet    Multiple Pain Sites  No         [facility] PT Assessment - 04/08/18 0001      Assessment   Medical Diagnosis  N30.20 Chronic cystitis    Referring Provider (PT)  Wallace CullensBree Ronald Pippinsharpe Fleck NP     Onset Date/Surgical Date  01/25/17    Prior Therapy  none      Precautions   Precautions  None      Restrictions   Weight Bearing Restrictions  No      Home Environment   Living Environment  Private residence      Prior Function   Level of Independence  Independent    Vocation  Student    Leisure  none      Cognition   Overall Cognitive Status  Within Functional Limits for tasks assessed      Posture/Postural Control   Posture/Postural Control  Postural limitations    Postural Limitations  Rounded Shoulders;Forward head;Decreased thoracic kyphosis;Posterior pelvic tilt;Flexed trunk      Strength   Right Hip Flexion  5/5    Right Hip External Rotation   4+/5    Right Hip Internal Rotation  5/5    Right Hip ABduction  5/5    Left Hip Flexion  5/5    Left Hip External Rotation  5/5  Left Hip Internal Rotation  5/5    Left Hip ABduction  3+/5      Palpation   SI assessment   Left ilium is rotated posteriorly rotated; sacrum rotated right                Pelvic Floor Special Questions - 04/08/18 0001    Currently Sexually Active  Yes    Is this Painful  Yes   when she is in pain   Urinary Leakage  No    Urinary urgency  No    Urinary frequency  pain with urination at level 10/10    Fecal incontinence  No    Pelvic Floor Internal Exam  Patient confirmed identication and approves PT to assess muscle and treatment    Exam Type  Vaginal    Strength  good squeeze, good lift, able to hold agaisnt strong resistance        [facility] Adult PT Treatment/Exercise - 04/08/18 0001      Exercises   Exercises  Lumbar      Lumbar Exercises: Stretches   Active Hamstring Stretch  Right;Left;1 rep;30 seconds   and legs apart stretche against the wall   Piriformis Stretch  Right;Left;1 rep;60 seconds   legs on wall   Other Lumbar Stretch Exercise  butterfly stretch to release the pelvic floor      Lumbar Exercises: Supine   Bridge with [employer]  10 reps       Modalities   Modalities  Electrical Stimulation;Moist Heat      Moist Heat Therapy   Number Minutes Moist Heat  20 Minutes    Moist Heat Location  Lumbar Spine   abdominal     Electrical Stimulation   Electrical Stimulation Location  sacral and lower abdominal    Electrical Stimulation Action  IFC    Electrical Stimulation Parameters  To patient tolerance, 20 min    Electrical Stimulation Goals  Pain      Manual Therapy   Manual Therapy  Joint mobilization;Muscle Energy Technique;Soft tissue mobilization    Joint Mobilization  rotational and p- mobiliazation to L3-L5 grade III; correct sacrum    Soft tissue mobilization  left quadratus and iliopsoas    Muscle Energy Technique  correct left ilium             PT Education - 04/08/18 1444    Education Details  stretches for the pelvic floor to relax while in pain    Person(s) Educated  Patient    Methods  Explanation;Demonstration;Verbal cues;Handout    Comprehension  Returned demonstration;Verbalized understanding       PT Short Term Goals - 01/08/18 1024      PT SHORT TERM GOAL #1   Title  independent with initial HEP    Time  4    Period  Weeks    Status  Achieved      PT SHORT TERM GOAL #2   Title  understand how to toilet correctly to reduce strain on the pelvic floor    Time  4    Period  Weeks    Status  Achieved        PT Long Term Goals - 03/23/18 1445      PT LONG TERM GOAL #1   Title  independent with HEP    Baseline  still learning    Time  8    Status  On-going      PT LONG TERM GOAL #2  Title  reduce pain with vaginal penetration with exam and penile </= 3/10    Baseline  pain level 2/10    Time  8    Period  Weeks    Status  Achieved      PT LONG TERM GOAL #3   Title  burining with urination decreased >/= 75% due to ability to relax the pelvic floor    Baseline  burning pain 8/10; depends on what she eats;     Time  8    Period  Weeks    Status  On-going      PT LONG TERM GOAL #5    Title  sitting for 30 minutes wiith pain level </3-4/10 due to decreased muscle tension     Baseline  pain level is 6/10 when sitting for 30 minutes    Time  12    Period  Weeks    Status  On-going            Plan - 04/08/18 1445    Clinical Impression Statement  Patient is having increased pain today at 10/10. Patient is having hydrodistension on 04/10/2018. Patient has learned exercises to stretch the pelvic floor and relax. Patient pelvis was in correct alignment after therapy. Patient used the electrical stimulation to reduce the pain.  Patient is nervous about the surgery. Patient has not made progress due to increased pain at this time but will be reassesed after surgery.     Rehab Potential  Excellent    Clinical Impairments Affecting Rehab Potential  none    PT Frequency  1x / week    PT Duration  12 weeks    PT Treatment/Interventions  Biofeedback;Cryotherapy;Electrical Stimulation;Moist Heat;Ultrasound;Therapeutic exercise;Therapeutic activities;Neuromuscular re-education;Patient/family education;Manual techniques;Dry needling    PT Next Visit Plan  see how patient did with the hydrodistension; internal soft tissue work    PT Home Exercise Plan  ACCESS CODE: [facility]    Consulted and Agree with Plan of Care  Patient       Patient will benefit from skilled therapeutic intervention in order to improve the following deficits and impairments:  Increased fascial restricitons, Pain, Decreased mobility, Increased muscle spasms, Postural dysfunction, Decreased activity tolerance, Decreased range of motion, Decreased strength  Visit Diagnosis: Muscle weakness (generalized)  Abnormal posture  Cramp and spasm     Problem List There are no active problems to display for this patient.   Eulis Foster, PT 04/08/18 2:49 PM   [facility] [address] [place], [place], [address] Phone: [phone]   Fax:   [phone]  Name: Alexis [name] MRN: [id number] Date of Birth: 05/22/2001

## 2018-04-08 NOTE — Progress Notes (Signed)
Spoke with: Baird Lyonsasey NPO:  No food after midnight/Clear liquids until 7:00AM DOS Arrival time:  11:00AM Labs: UPT AM medications: None Pre op orders: Yes Ride home:  Fayrene FearingJames (dad) [phone]

## 2018-04-09 NOTE — Anesthesia Preprocedure Evaluation (Addendum)
Anesthesia Evaluation  Patient identified by MRN, date of birth, ID band Patient awake    Reviewed: Allergy & Precautions, NPO status , Patient's Chart, lab work & pertinent test results  History of Anesthesia Complications Negative for: history of anesthetic complications  Airway Mallampati: I  TM Distance: >3 FB Neck ROM: Full    Dental no notable dental hx. (+) Teeth Intact, Dental Advisory Given   Pulmonary asthma (Mild, no inhaler use in several weeks) ,    Pulmonary exam normal breath sounds clear to auscultation       Cardiovascular negative cardio ROS Normal cardiovascular exam Rhythm:Regular Rate:Normal     Neuro/Psych  Headaches, Anxiety Depression    GI/Hepatic Neg liver ROS, GERD  Controlled,  Endo/Other  negative endocrine ROS  Renal/GU negative Renal ROS     Musculoskeletal negative musculoskeletal ROS (+)   Abdominal   Peds  Hematology negative hematology ROS (+)   Anesthesia Other Findings Day of surgery medications reviewed with the patient.  Reproductive/Obstetrics                            Anesthesia Physical Anesthesia Plan  ASA: II  Anesthesia Plan: General   Post-op Pain Management:    Induction: Intravenous  PONV Risk Score and Plan: 3 and Treatment may vary due to age or medical condition, Ondansetron, Dexamethasone, Midazolam and Scopolamine patch - Pre-op  Airway Management Planned: LMA  Additional Equipment:   Intra-op Plan:   Post-operative Plan: Extubation in OR  Informed Consent: I have reviewed the patients History and Physical, chart, labs and discussed the procedure including the risks, benefits and alternatives for the proposed anesthesia with the patient or authorized representative who has indicated his/her understanding and acceptance.   Dental advisory given  Plan Discussed with: CRNA  Anesthesia Plan Comments:       Anesthesia  Quick Evaluation

## 2018-04-10 ENCOUNTER — Ambulatory Visit (HOSPITAL_BASED_OUTPATIENT_CLINIC_OR_DEPARTMENT_OTHER): Payer: Medicaid Other | Admitting: Anesthesiology

## 2018-04-10 ENCOUNTER — Ambulatory Visit (HOSPITAL_BASED_OUTPATIENT_CLINIC_OR_DEPARTMENT_OTHER)
Admission: RE | Admit: 2018-04-10 | Discharge: 2018-04-10 | Disposition: A | Discharge status: Home / Self Care | Payer: Medicaid Other | Source: Ambulatory Visit | Attending: Urology | Admitting: Urology

## 2018-04-10 ENCOUNTER — Encounter (HOSPITAL_BASED_OUTPATIENT_CLINIC_OR_DEPARTMENT_OTHER)
Admission: RE | Disposition: A | Discharge status: Home / Self Care | Payer: Self-pay | Source: Ambulatory Visit | Attending: Urology

## 2018-04-10 ENCOUNTER — Encounter (HOSPITAL_BASED_OUTPATIENT_CLINIC_OR_DEPARTMENT_OTHER): Payer: Self-pay

## 2018-04-10 DIAGNOSIS — N3020 Other chronic cystitis without hematuria: Secondary | ICD-10-CM | POA: Diagnosis present

## 2018-04-10 DIAGNOSIS — N3090 Cystitis, unspecified without hematuria: Secondary | ICD-10-CM

## 2018-04-10 DIAGNOSIS — Z791 Long term (current) use of non-steroidal anti-inflammatories (NSAID): Secondary | ICD-10-CM | POA: Diagnosis not present

## 2018-04-10 DIAGNOSIS — Z87440 Personal history of urinary (tract) infections: Secondary | ICD-10-CM | POA: Insufficient documentation

## 2018-04-10 DIAGNOSIS — Z79899 Other long term (current) drug therapy: Secondary | ICD-10-CM | POA: Diagnosis not present

## 2018-04-10 HISTORY — DX: Gastro-esophageal reflux disease without esophagitis: K21.9

## 2018-04-10 HISTORY — DX: Major depressive disorder, single episode, unspecified: F32.9

## 2018-04-10 HISTORY — DX: Depression, unspecified: F32.A

## 2018-04-10 HISTORY — DX: Migraine, unspecified, not intractable, without status migrainosus: G43.909

## 2018-04-10 HISTORY — DX: Anxiety disorder, unspecified: F41.9

## 2018-04-10 HISTORY — DX: Unspecified asthma, uncomplicated: J45.909

## 2018-04-10 HISTORY — PX: CYSTO WITH HYDRODISTENSION: SHX5453

## 2018-04-10 LAB — POCT PREGNANCY, URINE: Preg Test, Ur: NEGATIVE

## 2018-04-10 SURGERY — CYSTOSCOPY, WITH BLADDER HYDRODISTENSION
Anesthesia: General

## 2018-04-10 MED ORDER — MIDAZOLAM HCL 5 MG/5ML IJ SOLN
INTRAMUSCULAR | Status: DC | PRN
  Administered 2018-04-10: 2 mg via INTRAVENOUS

## 2018-04-10 MED ORDER — DEXAMETHASONE SODIUM PHOSPHATE 10 MG/ML IJ SOLN
INTRAMUSCULAR | Status: AC
  Filled 2018-04-10: qty 1

## 2018-04-10 MED ORDER — FENTANYL CITRATE (PF) 100 MCG/2ML IJ SOLN
25.0000 ug | INTRAMUSCULAR | Status: DC | PRN
  Administered 2018-04-10 (×3): 25 ug via INTRAVENOUS
  Filled 2018-04-10: qty 1

## 2018-04-10 MED ORDER — OXYCODONE HCL 5 MG PO TABS
ORAL_TABLET | ORAL | Status: AC
  Filled 2018-04-10: qty 1

## 2018-04-10 MED ORDER — OXYCODONE HCL 5 MG PO TABS
5.0000 mg | ORAL_TABLET | Freq: Once | ORAL | Status: AC | PRN
  Administered 2018-04-10: 5 mg via ORAL
  Filled 2018-04-10: qty 1

## 2018-04-10 MED ORDER — LIDOCAINE 2% (20 MG/ML) 5 ML SYRINGE
INTRAMUSCULAR | Status: AC
  Filled 2018-04-10: qty 5

## 2018-04-10 MED ORDER — BELLADONNA ALKALOIDS-OPIUM 16.2-60 MG RE SUPP
RECTAL | Status: AC
  Filled 2018-04-10: qty 1

## 2018-04-10 MED ORDER — PHENAZOPYRIDINE HCL 200 MG PO TABS: 200.0000 mg | ORAL_TABLET | Freq: Three times a day (TID) | ORAL | 0 refills | Status: DC | PRN

## 2018-04-10 MED ORDER — BELLADONNA ALKALOIDS-OPIUM 16.2-60 MG RE SUPP
RECTAL | Status: DC | PRN
  Administered 2018-04-10: 1 via RECTAL

## 2018-04-10 MED ORDER — TRAMADOL HCL 50 MG PO TABS: 50.0000 mg | ORAL_TABLET | Freq: Four times a day (QID) | ORAL | 0 refills | Status: DC | PRN

## 2018-04-10 MED ORDER — DEXAMETHASONE SODIUM PHOSPHATE 4 MG/ML IJ SOLN
INTRAMUSCULAR | Status: DC | PRN
  Administered 2018-04-10: 4 mg via INTRAVENOUS

## 2018-04-10 MED ORDER — LACTATED RINGERS IV SOLN
INTRAVENOUS | Status: DC
  Administered 2018-04-10 (×2): via INTRAVENOUS
  Filled 2018-04-10: qty 1000

## 2018-04-10 MED ORDER — PROPOFOL 10 MG/ML IV BOLUS
INTRAVENOUS | Status: AC
  Filled 2018-04-10: qty 20

## 2018-04-10 MED ORDER — FENTANYL CITRATE (PF) 100 MCG/2ML IJ SOLN
INTRAMUSCULAR | Status: AC
  Filled 2018-04-10: qty 2

## 2018-04-10 MED ORDER — PHENAZOPYRIDINE HCL 200 MG PO TABS
ORAL | Status: DC | PRN
  Administered 2018-04-10: 15 mL via INTRAVESICAL

## 2018-04-10 MED ORDER — BUPIVACAINE HCL (PF) 0.5 % IJ SOLN
INTRAMUSCULAR | Status: AC
  Filled 2018-04-10: qty 30

## 2018-04-10 MED ORDER — ONDANSETRON HCL 4 MG/2ML IJ SOLN
INTRAMUSCULAR | Status: AC
  Filled 2018-04-10: qty 2

## 2018-04-10 MED ORDER — MIDAZOLAM HCL 2 MG/2ML IJ SOLN
INTRAMUSCULAR | Status: AC
  Filled 2018-04-10: qty 2

## 2018-04-10 MED ORDER — PROMETHAZINE HCL 25 MG/ML IJ SOLN
6.2500 mg | INTRAMUSCULAR | Status: DC | PRN
  Filled 2018-04-10: qty 1

## 2018-04-10 MED ORDER — ONDANSETRON HCL 4 MG/2ML IJ SOLN
INTRAMUSCULAR | Status: DC | PRN
  Administered 2018-04-10: 4 mg via INTRAVENOUS

## 2018-04-10 MED ORDER — KETOROLAC TROMETHAMINE 30 MG/ML IJ SOLN
INTRAMUSCULAR | Status: DC | PRN
  Administered 2018-04-10: 30 mg via INTRAVENOUS

## 2018-04-10 MED ORDER — FENTANYL CITRATE (PF) 100 MCG/2ML IJ SOLN
INTRAMUSCULAR | Status: DC | PRN
  Administered 2018-04-10 (×2): 50 ug via INTRAVENOUS

## 2018-04-10 MED ORDER — PROPOFOL 10 MG/ML IV BOLUS
INTRAVENOUS | Status: DC | PRN
  Administered 2018-04-10: 100 mg via INTRAVENOUS

## 2018-04-10 MED ORDER — OXYCODONE HCL 5 MG/5ML PO SOLN
5.0000 mg | Freq: Once | ORAL | Status: AC | PRN
  Filled 2018-04-10: qty 5

## 2018-04-10 MED ORDER — LIDOCAINE HCL (CARDIAC) PF 100 MG/5ML IV SOSY
PREFILLED_SYRINGE | INTRAVENOUS | Status: DC | PRN
  Administered 2018-04-10: 60 mg via INTRAVENOUS

## 2018-04-10 MED ORDER — ACETAMINOPHEN 10 MG/ML IV SOLN
1000.0000 mg | Freq: Once | INTRAVENOUS | Status: DC | PRN
  Filled 2018-04-10: qty 100

## 2018-04-10 SURGICAL SUPPLY — 18 items
BAG DRAIN URO-CYSTO SKYTR STRL (DRAIN) ×3 IMPLANT
BAG DRN UROCATH (DRAIN) ×1
CATH ROBINSON RED A/P 14FR (CATHETERS) ×2 IMPLANT
CATH ROBINSON RED A/P 16FR (CATHETERS) IMPLANT
CLOTH BEACON ORANGE TIMEOUT ST (SAFETY) ×3 IMPLANT
ELECT REM PT RETURN 9FT ADLT (ELECTROSURGICAL) ×3
ELECTRODE REM PT RTRN 9FT ADLT (ELECTROSURGICAL) ×1 IMPLANT
GLOVE BIO SURGEON STRL SZ7.5 (GLOVE) ×3 IMPLANT
GOWN STRL REUS W/TWL XL LVL3 (GOWN DISPOSABLE) ×6 IMPLANT
KIT TURNOVER CYSTO (KITS) ×3 IMPLANT
MANIFOLD NEPTUNE II (INSTRUMENTS) ×2 IMPLANT
NDL SAFETY ECLIPSE 18X1.5 (NEEDLE) IMPLANT
NEEDLE HYPO 18GX1.5 SHARP (NEEDLE) ×3
PACK CYSTO (CUSTOM PROCEDURE TRAY) ×6 IMPLANT
SYR 20CC LL (SYRINGE) ×5 IMPLANT
TUBE CONNECTING 12'X1/4 (SUCTIONS) ×1
TUBE CONNECTING 12X1/4 (SUCTIONS) ×1 IMPLANT
WATER STERILE IRR 3000ML UROMA (IV SOLUTION) ×3 IMPLANT

## 2018-04-10 NOTE — Anesthesia Procedure Notes (Signed)
Procedure Name: LMA Insertion Date/Time: 04/10/2018 1:17 PM Performed by: Shanon PayorGregory, Khylei Wilms M, CRNA Pre-anesthesia Checklist: Patient identified, Emergency Drugs available, Patient being monitored, Suction available and Timeout performed Patient Re-evaluated:Patient Re-evaluated prior to induction Oxygen Delivery Method: Circle system utilized Preoxygenation: Pre-oxygenation with 100% oxygen Induction Type: IV induction LMA: LMA inserted LMA Size: 3.0 Number of attempts: 1 Placement Confirmation: ETT inserted through vocal cords under direct vision,  positive ETCO2 and breath sounds checked- equal and bilateral Tube secured with: Tape Dental Injury: Teeth and Oropharynx as per pre-operative assessment

## 2018-04-10 NOTE — Transfer of Care (Signed)
Immediate Anesthesia Transfer of Care Note  Patient: Alexis [name]  Procedure(s) Performed: CYSTOSCOPY/HYDRODISTENSION (N/A )  Patient Location: PACU  Anesthesia Type:General  Level of Consciousness: awake, alert  and oriented  Airway & Oxygen Therapy: Patient Spontanous Breathing and Patient connected to nasal cannula oxygen  Post-op Assessment: Report given to RN and Post -op Vital signs reviewed and stable  Post vital signs: Reviewed and stable  Last Vitals:  Vitals Value Taken Time  BP    Temp    Pulse 74 04/10/2018  1:47 PM  Resp 13 04/10/2018  1:47 PM  SpO2 98 % 04/10/2018  1:47 PM  Vitals shown include unvalidated device data.  Last Pain:  Vitals:   04/10/18 1122  TempSrc:   PainSc: 8       Patients Stated Pain Goal: 3 (04/10/18 1122)  Complications: No apparent anesthesia complications

## 2018-04-10 NOTE — Discharge Instructions (Signed)
CYSTOSCOPY HOME CARE INSTRUCTIONS  Activity: Rest for the remainder of the day.  Do not drive or operate equipment today.  You may resume normal activities in one to two days as instructed by your physician.   Meals: Drink plenty of liquids and eat light foods such as gelatin or soup this evening.  You may return to a normal meal plan tomorrow.  Return to Work: You may return to work in one to two days or as instructed by your physician.  Special Instructions / Symptoms: Call your physician if any of these symptoms occur:   -persistent or heavy bleeding  -bleeding which continues after first few urination  -large blood clots that are difficult to pass  -urine stream diminishes or stops completely  -fever equal to or higher than 101 degrees Farenheit.  -cloudy urine with a strong, foul odor  -severe pain  Females should always wipe from front to back after elimination.  You may feel some burning pain when you urinate.  This should disappear with time.  Applying moist heat to the lower abdomen or a hot tub bath may help relieve the pain.   Follow-Up / Date of Return Visit to Your Physician:  December 23rd, 2019 at 3:15pm   Post Anesthesia Home Care Instructions  Activity: Get plenty of rest for the remainder of the day. A responsible individual must stay with you for 24 hours following the procedure.  For the next 24 hours, DO NOT: -Drive a car -Advertising copywriterperate machinery -Drink alcoholic beverages -Take any medication unless instructed by your physician -Make any legal decisions or sign important papers.  Meals: Start with liquid foods such as gelatin or soup. Progress to regular foods as tolerated. Avoid greasy, spicy, heavy foods. If nausea and/or vomiting occur, drink only clear liquids until the nausea and/or vomiting subsides. Call your physician if vomiting continues.  Special Instructions/Symptoms: Your throat may feel dry or sore from the anesthesia or the breathing tube  placed in your throat during surgery. If this causes discomfort, gargle with warm salt water. The discomfort should disappear within 24 hours.

## 2018-04-10 NOTE — H&P (Signed)
f/u for recurrent urinary tract infections  HPI: Alexis [name] is a 17 year-old female established patient who is here today for interval evaluation of recurrent UTIs.  The patient was last seen 02/27/18.   The patient has not been treated for a UTI since last visit. The patient is not currently taking suppressive antibiotics.   In the past the patient has not taken suppressive abx. She is not also taking cranberry tablets. The patient is taking a probiotic.   The patient has not noted significant changes in their urinary tract symptoms since their last visit. She feels as if she does not empty her bladder with each void. She does urinate more frequently than once every 2 hours in the daytime. She does have frequency. The patient denies having urinary incontinence. She does not have burning or discomfort when she urinates. The patient has not noticed visible blood in their urine since the last visit.   The patient does not have a history of constipation. She does not have a of dysparunia.   12/16/17: Has documented e.coli infections twice at our office, likely never cleared the first infection. Most recently, was treated with Bactrim starting 4 days ago.   The patient was initially started on Bactrim, this was transitioned to doxycycline once the culture returned. She has been taking doxycycline now for 24 hours, her symptoms have started to improve. She does complain of some nausea as well as chronic pelvic pain. She is taking Pyridium which seemed to alleviate her symptoms.   01/02/18: She returns today for follow up. She states that symptoms have improved following most recent treatment with Doxycycline. She continues to complain of ongoing pelvic pain and intermittent dysuria. Dysuria is well controlled with PRN Pyridum. She recently started pelvic floor PT. She denies any current gross hematuria, fever, chills, nausea, or vomiting.   02/09/18: She returns today for follow up. Today, she complains  dysuria and suprapubic pain. She denies exacerbation of voiding symptoms or difficulties voiding. She states that symptoms have been ongoing for about one week, but have progressed over the past 3 days. She states that she did have some gross hematuria over the weekend, but denied clots. She denies fever or chills. She has had some intermittent nausea. She denies any unilateral flank pain. She denies past history of renal stones. CT from May 2019 showed not stones or acute abnormalities. She continues to use Hydroxyzine for suspected IC. She is also undergoing PT and following diet closely, both of which she feels has been beneficial. She has also been using Prelief. No issues with constipation currently. Dysuria continues to be most bothersome complaint at this time.   02/27/18: Most recent urine culture on 9/23 was negative for infection. She was started on Amitriptyline Hcl 25 mg at most recent appointment and reamins on Hydroxyzine. She continues to use Pyridum for dysuria on PRN basis. She states that she did try Uribel and found it to be more beneficial, but the Rx was too costly. She presents today with complaints of vaginal burning and discharge. She states that these symptoms started about one week ago. She denies any vaginal itching. She continues to have dysuria, but states that voiding symptoms are at baseline. No gross hematuria or fevers. She is interested in bladder installation today to see if this would help better control her bladder pain.   03/16/18: Since last visit patient has still been experiencing some burning with urination/pelvic pain, no discharge. At last visit she received a bladder  installation which she states that the next day after she started feeling "bad" again with a lot of urgency. The patient has been working with pelvic floor physical therapy and has had nearly resolved constipation. She continues to have intermittent painful intercourse. However, her biggest issue is the  dysuria. She continues to use Pyridium for symptoms which seemed to control them slightly. Currently she has a 4/10 in pain and burning. She states that she has food and associated fluid exacerbations of her symptoms. She is very concerned about the diagnosis of interstitial cystitis, she is miserable, would like to find something to help control her symptoms.     ALLERGIES: None    MEDICATIONS: Amitriptyline Hcl 25 mg tablet 1 tablet PO Q HS  Hydroxyzine Hcl  Lactobacillus  Tri-Lo-Marzia     GU PSH: Catheterize For Residual - 02/27/2018      PSH Notes: Wisdom teeth   NON-GU PSH: None   GU PMH: Candidiasis of vulva and vagina - 02/27/2018 Dysuria - 02/20/2018 Acute Cystitis/UTI - 02/09/2018, - 01/02/2018, - 12/16/2017 Pelvic/perineal pain - 01/02/2018 Chronic cystitis (w/o hematuria) - 10/14/2017      PMH Notes: 10/08/17: The patient had been experiencing intermittent dysuria and superpubic discomfort evaluated by her primary care physician without an obvious source identified. She presented to the emergency room 15/6/19 with a three-day history of right flank and low back pain was waxing and waning associated with dysuria, urgency, pressure and suprapubic pain. Her urinalysis was nitrite positive with an elevated white count of 18.2. Her wet prep was positive for clue cells suggesting bacterial vaginosis. A CT scan revealed no abnormality of the kidneys, ureters or bladder.   HIV, RPR, GC and chlamydia tests on 09/29/17 were negative. Her creatinine at that time was 0.7.     NON-GU PMH: Asthma    FAMILY HISTORY: No pertinent family history - Runs in Family   SOCIAL HISTORY: Marital Status: Single Preferred Language: English; Ethnicity: Not Hispanic Or Latino; Race: White Current Smoking Status: Patient has never smoked.   Tobacco Use Assessment Completed: Used Tobacco in last 30 days? Has never drank.  Does not drink caffeine. Patient's occupation Buyer, retail at [employer].    REVIEW  OF SYSTEMS:    GU Review Female:   Patient reports frequent urination, hard to postpone urination, and burning /pain with urination. Patient denies get up at night to urinate, leakage of urine, stream starts and stops, trouble starting your stream, have to strain to urinate, and being pregnant.  Gastrointestinal (Upper):   Patient denies nausea, vomiting, and indigestion/ heartburn.  Gastrointestinal (Lower):   Patient denies diarrhea and constipation.  Constitutional:   Patient denies fever, night sweats, weight loss, and fatigue.  Skin:   Patient denies skin rash/ lesion and itching.  Eyes:   Patient denies blurred vision and double vision.  Ears/ Nose/ Throat:   Patient denies sore throat and sinus problems.  Hematologic/Lymphatic:   Patient denies swollen glands and easy bruising.  Cardiovascular:   Patient denies leg swelling and chest pains.  Respiratory:   Patient denies cough and shortness of breath.  Endocrine:   Patient denies excessive thirst.  Musculoskeletal:   Patient denies back pain and joint pain.  Neurological:   Patient denies headaches and dizziness.  Psychologic:   Patient denies depression and anxiety.   VITAL SIGNS:      03/16/2018 03:08 PM  Weight 108 lb / 48.99 kg  Height 66 in / 167.64 cm  BP 112/73 mmHg  Pulse 103 /min  BMI 17.4 kg/m   MULTI-SYSTEM PHYSICAL EXAMINATION:    Constitutional: Well-nourished. No physical deformities. Normally developed. Good grooming.  Respiratory: Normal breath sounds. No labored breathing, no use of accessory muscles.   Cardiovascular: Regular rate and rhythm. No murmur, no gallop. Normal temperature, normal extremity pulses, no swelling, no varicosities.      PAST DATA REVIEWED:  Source Of History:  Patient  Records Review:   Previous Doctor Records, Previous Patient Records, POC Tool  Urine Test Review:   Urinalysis, Urine Culture   PROCEDURES:          Urinalysis w/Scope - 81001 Dipstick Dipstick Cont'd Micro  Color:  Yellow Bilirubin: Neg WBC/hpf: NS (Not Seen)  Appearance: Clear Ketones: Trace RBC/hpf: 0 - 2/hpf  Specific Gravity: 1.025 Blood: 3+ Bacteria: Rare (0-9/hpf)  pH: 6.0 Protein: Trace Cystals: Amorph Urates  Glucose: Neg Urobilinogen: 4.0 Casts: NS (Not Seen)    Nitrites: Positive Trichomonas: Not Present    Leukocyte Esterase: Neg Mucous: Not Present      Epithelial Cells: 0 - 5/hpf      Yeast: NS (Not Seen)      Sperm: Not Present    Notes:  neon yellow color interference neon yellow     ASSESSMENT:      ICD-10 Details  1 GU:   Chronic cystitis (w/o hematuria) - N30.20    PLAN:            Medications New Meds: Mobic 15 mg tablet 1 tablet PO Daily   #30  1 Refill(s)    Refill Meds: Phenazopyridine Hcl 100 mg tablet 1 tablet PO TID PRN   #30  2 Refill(s)  Valium 10 mg tablet 1 tablet Per Vagina Q HS   #30  2 Refill(s)            Orders Labs Urine Culture          Document Letter(s):  Created for Patient: Clinical Summary         Notes:   The patient had a long discussion about her symptoms and her proposed diagnosis. I strongly think that the patient has pelvic floor dysfunction and chronic pelvic pain. However, she has tried most everything that we have given her and she has had very little results. She does feel as if physical therapy's help her constipation, but has not helped with her dysuria. She has pain every day.

## 2018-04-10 NOTE — Progress Notes (Signed)
Bladder held 850 ml of fluid measured by Dr. Marlou PorchHerrick

## 2018-04-10 NOTE — Anesthesia Postprocedure Evaluation (Signed)
Anesthesia Post Note  Patient: Alexis [name]  Procedure(s) Performed: CYSTOSCOPY/HYDRODISTENSION (N/A )     Patient location during evaluation: PACU Anesthesia Type: General Level of consciousness: awake and alert Pain management: pain level controlled Vital Signs Assessment: post-procedure vital signs reviewed and stable Respiratory status: spontaneous breathing, nonlabored ventilation and respiratory function stable Cardiovascular status: blood pressure returned to baseline and stable Postop Assessment: no apparent nausea or vomiting Anesthetic complications: no    Last Vitals:  Vitals:   04/10/18 1412 04/10/18 1415  BP:  125/87  Pulse: (!) 104 99  Resp: 16 17  Temp:    SpO2: 98% 96%    Last Pain:  Vitals:   04/10/18 1415  TempSrc:   PainSc: 7                  Kaylyn LayerKathryn E Croix Presley

## 2018-04-10 NOTE — Op Note (Signed)
Preoperative diagnosis:  1. Bladder pain   Postoperative diagnosis:  1. same   Procedure: 1. hydrodistention 2. Cystoscopy  Surgeon: Crist FatBenjamin W. Lynnmarie Lovett, MD  Anesthesia: General  Complications: None  Intraoperative findings: the patient's bladder capacity was approximately 900 cc.  There were no hunnar lesions or areas of ulceration.  There was significant bleeding after the dilation, which would be expected.  EBL: Minimal  Specimens: None  Indication: Alexis [name] is a 17 y.o. patient with severe bladder pain.  She has tried medical therapy as well as physical therapy unsuccessfully.  After reviewing the management options for treatment, he elected to proceed with the above surgical procedure(s). We have discussed the potential benefits and risks of the procedure, side effects of the proposed treatment, the likelihood of the patient achieving the goals of the procedure, and any potential problems that might occur during the procedure or recuperation. Informed consent has been obtained.  Description of procedure:  The patient was taken to the operating room and general anesthesia was induced.  The patient was placed in the dorsal lithotomy position, prepped and draped in the usual sterile fashion, and preoperative antibiotics were administered. A preoperative time-out was performed.   A 21 French 30 cystoscope was gently passed through the patient's urethra and into the bladder under visual guidance.  The bladder was then drained and slowly filledas cystoscopy was performed.  This demonstrated a normal appearing bladder with orthotopic ureters.  There was no significant abnormalities.  With the fluid at 80 cm above the patient's bladderher bladder was passively filled until a water or irrigant stopped flowing.  Her bladder capacity was noted to be 900 cc.the bladder was subsequently drained.  Reinspection demonstrated friable mucosa but otherwise no significant abnormalities.  The  bladder was drained and through a 16 French red rubber catheter 40 mL's of 400 mg Pyridium and 15 mg of Marcaine was instilled in the patient's bladder.  The case was subsequently terminated.  The patient tolerated the procedure without difficulty.  Crist FatBenjamin W. Alexis [name], M.D.

## 2018-04-13 ENCOUNTER — Ambulatory Visit: Payer: Medicaid Other | Admitting: Physical Therapy

## 2018-04-13 ENCOUNTER — Encounter (HOSPITAL_BASED_OUTPATIENT_CLINIC_OR_DEPARTMENT_OTHER): Payer: Self-pay | Admitting: Urology

## 2018-04-13 DIAGNOSIS — M6281 Muscle weakness (generalized): Secondary | ICD-10-CM

## 2018-04-13 DIAGNOSIS — R293 Abnormal posture: Secondary | ICD-10-CM

## 2018-04-13 DIAGNOSIS — R252 Cramp and spasm: Secondary | ICD-10-CM

## 2018-04-13 NOTE — Therapy (Signed)
[facility] [address], [address] [place], [place], [address] Phone: [phone]   Fax:  [phone]  Physical Therapy Treatment  Patient Details  Name: Alexis [name] MRN: [id number] Date of Birth: 2000-06-28 Referring Provider (PT): Burman Foster NP   Encounter Date: 04/13/2018  PT End of Session - 04/13/18 1405    Visit Number  11    Date for PT Re-Evaluation  07/31/18    Authorization Type  Medicaid    Authorization Time Period  02/16/18-05/10/18 for 12 visits    Authorization - Visit Number  4    Authorization - Number of Visits  12    PT Start Time  1400    PT Stop Time  1500    PT Time Calculation (min)  60 min    Activity Tolerance  Patient tolerated treatment well    Behavior During Therapy  [facility] for tasks assessed/performed       Past Medical History:  Diagnosis Date  . Anxiety   . Asthma   . Depression   . GERD (gastroesophageal reflux disease)   . Migraines     Past Surgical History:  Procedure Laterality Date  . CYSTO WITH HYDRODISTENSION N/A 04/10/2018   Procedure: CYSTOSCOPY/HYDRODISTENSION;  Surgeon: Crist Fat, MD;  Location: [facility];  Service: Urology;  Laterality: N/A;  . UPPER GI ENDOSCOPY      There were no vitals filed for this visit.  Subjective Assessment - 04/13/18 1402    Subjective  They said they did not find anything and my pain is stress. I have pain in my right low abdominal. I did not go to school today and will go tomorrow.     Patient Stated Goals  reduce pain    Currently in Pain?  Yes    Pain Score  6     Pain Location  Abdomen    Pain Orientation  Right;Mid    Pain Descriptors / Indicators  Aching    Pain Type  Chronic pain    Pain Onset  More than a month ago    Pain Frequency  Constant    Aggravating Factors   urination, bowel movements, intercourse    Pain Relieving Factors  heat, moving around         [facility] PT Assessment -  04/13/18 0001      Assessment   Medical Diagnosis  N30.20 Chronic cystitis    Referring Provider (PT)  Wallace Cullens Ronald Pippins NP    Onset Date/Surgical Date  01/25/17    Prior Therapy  none      Precautions   Precautions  None      Restrictions   Weight Bearing Restrictions  No      Home Environment   Living Environment  Private residence      Prior Function   Level of Independence  Independent    Vocation  Student    Leisure  none      Cognition   Overall Cognitive Status  Within Functional Limits for tasks assessed      Posture/Postural Control   Posture/Postural Control  Postural limitations    Postural Limitations  Rounded Shoulders;Forward head;Decreased thoracic kyphosis;Posterior pelvic tilt;Flexed trunk      Strength   Right Hip Flexion  5/5    Right Hip External Rotation   4+/5    Right Hip Internal Rotation  5/5    Right Hip ABduction  5/5    Left Hip  Flexion  5/5    Left Hip External Rotation  5/5    Left Hip Internal Rotation  5/5    Left Hip ABduction  3+/5      Palpation   SI assessment   pelvis in correct alignment                Pelvic Floor Special Questions - 04/13/18 0001    Currently Sexually Active  Yes    Is this Painful  Yes   when she is in pain   Urinary Leakage  No    Urinary frequency  pain with urination at level 10/10   more frequent since surgery   Fecal incontinence  No    Pelvic Floor Internal Exam  Patient confirmed identication and approves PT to assess muscle and treatment    Exam Type  Vaginal    Palpation  only able to perform soft tissue work to the introitus and outside. Patient not able to tolerate the therapist index finger into the vaginal canal        [facility] Adult PT Treatment/Exercise - 04/13/18 0001      Modalities   Modalities  Electrical Stimulation;Moist Heat      Moist Heat Therapy   Moist Heat Location  Lumbar Spine   abdominal     Electrical Stimulation   Electrical Stimulation Location  sacral  and lower abdominal    Electrical Stimulation Goals  Pain      Manual Therapy   Manual Therapy  Myofascial release    Myofascial Release  around the introitus, along the bulbocavernosus and ischicavernosus, bil. inner thigh area toward the perineum, pulling of the perineum to release the tissue               PT Short Term Goals - 01/08/18 1024      PT SHORT TERM GOAL #1   Title  independent with initial HEP    Time  4    Period  Weeks    Status  Achieved      PT SHORT TERM GOAL #2   Title  understand how to toilet correctly to reduce strain on the pelvic floor    Time  4    Period  Weeks    Status  Achieved        PT Long Term Goals - 04/13/18 1409      PT LONG TERM GOAL #1   Title  independent with HEP    Baseline  still learning    Time  8    Period  Weeks    Status  On-going      PT LONG TERM GOAL #2   Title  reduce pain with vaginal penetration with exam and penile </= 3/10    Baseline  pain level 2/10    Time  8    Period  Weeks    Status  Achieved      PT LONG TERM GOAL #3   Title  burining with urination decreased >/= 75% due to ability to relax the pelvic floor    Baseline  burning pain 8/10; depends on what she eats;     Time  8    Period  Weeks    Status  On-going      PT LONG TERM GOAL #4   Title  reduce straining with bowel movement >/= 75% due to relaxation of pelvic floor muscles    Time  8    Period  Weeks  Status  Achieved      PT LONG TERM GOAL #5   Title  sitting for 30 minutes wiith pain level </3-4/10 due to decreased muscle tension     Baseline  pain level is 6/10 when sitting for 30 minutes    Time  12    Period  Weeks    Status  On-going            Plan - 04/13/18 1409    Clinical Impression Statement  Patient continues to have increase pain in the perineum and lower abdominal area since she had her procedure. Patient reports no bladder pain.  Patient pelvis is still in correct alignment. Patient is not able to  tolerate the therapist to place her finger in the vaginal canal to work on the musculature. Patient is not able to sit due to pain.  Patient still has burning and pain with urination.  Patient pelvic muscles are in spasms. Patient has increased strength of hips.  Patient will benefit from skilled therapy to work on decreased spasms of the pelvic floor to reduce pain and burning with urination.     Rehab Potential  Excellent    Clinical Impairments Affecting Rehab Potential  none    PT Frequency  1x / week    PT Duration  Other (comment)   4 months   PT Treatment/Interventions  Biofeedback;Cryotherapy;Electrical Stimulation;Moist Heat;Ultrasound;Therapeutic exercise;Therapeutic activities;Neuromuscular re-education;Patient/family education;Manual techniques;Dry needling    PT Next Visit Plan  internal soft tissue work internally and around vaginally; send in Medicaid extension    PT Home Exercise Plan  [email or web address]    Consulted and Agree with Plan of Care  Patient       Patient will benefit from skilled therapeutic intervention in order to improve the following deficits and impairments:  Increased fascial restricitons, Pain, Decreased mobility, Increased muscle spasms, Postural dysfunction, Decreased activity tolerance, Decreased range of motion, Decreased strength  Visit Diagnosis: Muscle weakness (generalized) - Plan: PT plan of care cert/re-cert  Abnormal posture - Plan: PT plan of care cert/re-cert  Cramp and spasm - Plan: PT plan of care cert/re-cert     Problem List There are no active problems to display for this patient.   Eulis FosterCheryl Karolyn Messing, PT 04/13/18 2:44 PM   [facility] [address] [place], [place], [address] Phone: [phone]   Fax:  [phone]  Name: Alexis [name] MRN: [id number] Date of Birth: 03/10/2001

## 2018-04-19 ENCOUNTER — Encounter (HOSPITAL_COMMUNITY): Payer: Self-pay

## 2018-04-19 ENCOUNTER — Emergency Department (HOSPITAL_COMMUNITY)
Admission: EM | Admit: 2018-04-19 | Discharge: 2018-04-19 | Disposition: A | Discharge status: Other Acute Inpatient Hospital | Payer: Medicaid Other | Attending: Emergency Medicine | Admitting: Emergency Medicine

## 2018-04-19 ENCOUNTER — Inpatient Hospital Stay (HOSPITAL_COMMUNITY)
Admission: AD | Admit: 2018-04-19 | Discharge: 2018-04-22 | DRG: 885 | Disposition: A | Discharge status: Home / Self Care | Payer: Medicaid Other | Source: Intra-hospital | Attending: Psychiatry | Admitting: Psychiatry

## 2018-04-19 ENCOUNTER — Other Ambulatory Visit: Payer: Self-pay

## 2018-04-19 DIAGNOSIS — Z818 Family history of other mental and behavioral disorders: Secondary | ICD-10-CM | POA: Diagnosis not present

## 2018-04-19 DIAGNOSIS — G8929 Other chronic pain: Secondary | ICD-10-CM | POA: Diagnosis present

## 2018-04-19 DIAGNOSIS — F4310 Post-traumatic stress disorder, unspecified: Secondary | ICD-10-CM | POA: Diagnosis present

## 2018-04-19 DIAGNOSIS — F411 Generalized anxiety disorder: Secondary | ICD-10-CM | POA: Diagnosis present

## 2018-04-19 DIAGNOSIS — F332 Major depressive disorder, recurrent severe without psychotic features: Principal | ICD-10-CM | POA: Diagnosis present

## 2018-04-19 DIAGNOSIS — Z9109 Other allergy status, other than to drugs and biological substances: Secondary | ICD-10-CM | POA: Diagnosis not present

## 2018-04-19 DIAGNOSIS — K219 Gastro-esophageal reflux disease without esophagitis: Secondary | ICD-10-CM | POA: Diagnosis present

## 2018-04-19 DIAGNOSIS — Z915 Personal history of self-harm: Secondary | ICD-10-CM

## 2018-04-19 DIAGNOSIS — Z23 Encounter for immunization: Secondary | ICD-10-CM

## 2018-04-19 DIAGNOSIS — Z79899 Other long term (current) drug therapy: Secondary | ICD-10-CM | POA: Insufficient documentation

## 2018-04-19 DIAGNOSIS — T424X2A Poisoning by benzodiazepines, intentional self-harm, initial encounter: Secondary | ICD-10-CM | POA: Insufficient documentation

## 2018-04-19 DIAGNOSIS — G4700 Insomnia, unspecified: Secondary | ICD-10-CM | POA: Diagnosis present

## 2018-04-19 DIAGNOSIS — R45851 Suicidal ideations: Secondary | ICD-10-CM | POA: Insufficient documentation

## 2018-04-19 DIAGNOSIS — N3010 Interstitial cystitis (chronic) without hematuria: Secondary | ICD-10-CM | POA: Diagnosis present

## 2018-04-19 DIAGNOSIS — Z008 Encounter for other general examination: Secondary | ICD-10-CM | POA: Insufficient documentation

## 2018-04-19 DIAGNOSIS — F419 Anxiety disorder, unspecified: Secondary | ICD-10-CM | POA: Diagnosis not present

## 2018-04-19 DIAGNOSIS — Z79891 Long term (current) use of opiate analgesic: Secondary | ICD-10-CM | POA: Diagnosis not present

## 2018-04-19 DIAGNOSIS — T450X2A Poisoning by antiallergic and antiemetic drugs, intentional self-harm, initial encounter: Secondary | ICD-10-CM | POA: Insufficient documentation

## 2018-04-19 DIAGNOSIS — J45909 Unspecified asthma, uncomplicated: Secondary | ICD-10-CM | POA: Insufficient documentation

## 2018-04-19 LAB — RAPID URINE DRUG SCREEN, HOSP PERFORMED
Amphetamines: NOT DETECTED
BARBITURATES: NOT DETECTED
BENZODIAZEPINES: POSITIVE — AB
COCAINE: NOT DETECTED
Opiates: NOT DETECTED
TETRAHYDROCANNABINOL: NOT DETECTED

## 2018-04-19 LAB — COMPREHENSIVE METABOLIC PANEL
ALBUMIN: 4 g/dL (ref 3.5–5.0)
ALT: 7 U/L (ref 0–44)
AST: 15 U/L (ref 15–41)
Alkaline Phosphatase: 36 U/L — ABNORMAL LOW (ref 38–126)
Anion gap: 9 (ref 5–15)
BUN: 15 mg/dL (ref 6–20)
CHLORIDE: 107 mmol/L (ref 98–111)
CO2: 24 mmol/L (ref 22–32)
Calcium: 9 mg/dL (ref 8.9–10.3)
Creatinine, Ser: 0.58 mg/dL (ref 0.44–1.00)
GFR calc Af Amer: >60 mL/min (ref >60)
GFR calc non Af Amer: >60 mL/min (ref >60)
GLUCOSE: 89 mg/dL (ref 70–99)
POTASSIUM: 3.3 mmol/L — AB (ref 3.5–5.1)
SODIUM: 140 mmol/L (ref 135–145)
Total Bilirubin: 0.8 mg/dL (ref 0.3–1.2)
Total Protein: 6.6 g/dL (ref 6.5–8.1)

## 2018-04-19 LAB — CBC
HEMATOCRIT: 36.3 % (ref 36.0–46.0)
HEMOGLOBIN: 11.4 g/dL — AB (ref 12.0–15.0)
MCH: 29.5 pg (ref 26.0–34.0)
MCHC: 31.4 g/dL (ref 30.0–36.0)
MCV: 94 fL (ref 80.0–100.0)
Platelets: 161 10*3/uL (ref 150–400)
RBC: 3.86 MIL/uL — ABNORMAL LOW (ref 3.87–5.11)
RDW: 11.4 % — ABNORMAL LOW (ref 11.5–15.5)
WBC: 2.9 10*3/uL — AB (ref 4.0–10.5)
nRBC: 0 % (ref 0.0–0.2)

## 2018-04-19 LAB — ACETAMINOPHEN LEVEL: Acetaminophen (Tylenol), Serum: <10 ug/mL — ABNORMAL LOW (ref 10–30)

## 2018-04-19 LAB — I-STAT BETA HCG BLOOD, ED (MC, WL, AP ONLY): I-stat hCG, quantitative: <5 m[IU]/mL (ref <5)

## 2018-04-19 LAB — ETHANOL

## 2018-04-19 LAB — SALICYLATE LEVEL: Salicylate Lvl: <7 mg/dL (ref 2.8–30.0)

## 2018-04-19 LAB — CBG MONITORING, ED: Glucose-Capillary: 74 mg/dL (ref 70–99)

## 2018-04-19 MED ORDER — INFLUENZA VAC SPLIT QUAD 0.5 ML IM SUSY
0.5000 mL | PREFILLED_SYRINGE | INTRAMUSCULAR | Status: AC
  Administered 2018-04-20: 0.5 mL via INTRAMUSCULAR
  Filled 2018-04-19: qty 0.5

## 2018-04-19 MED ORDER — TRAZODONE HCL 50 MG PO TABS: 50.0000 mg | ORAL_TABLET | Freq: Every evening | ORAL | Status: DC | PRN

## 2018-04-19 MED ORDER — POTASSIUM CHLORIDE CRYS ER 20 MEQ PO TBCR
40.0000 meq | EXTENDED_RELEASE_TABLET | Freq: Once | ORAL | Status: AC
  Administered 2018-04-19: 40 meq via ORAL
  Filled 2018-04-19: qty 2

## 2018-04-19 MED ORDER — MAGNESIUM HYDROXIDE 400 MG/5ML PO SUSP
30.0000 mL | Freq: Every day | ORAL | Status: DC | PRN
  Administered 2018-04-21: 30 mL via ORAL
  Filled 2018-04-19: qty 30

## 2018-04-19 MED ORDER — ALUM & MAG HYDROXIDE-SIMETH 200-200-20 MG/5ML PO SUSP: 30.0000 mL | ORAL | Status: DC | PRN

## 2018-04-19 MED ORDER — PNEUMOCOCCAL VAC POLYVALENT 25 MCG/0.5ML IJ INJ
0.5000 mL | INJECTION | INTRAMUSCULAR | Status: AC
  Administered 2018-04-20: 0.5 mL via INTRAMUSCULAR

## 2018-04-19 MED ORDER — PHENAZOPYRIDINE HCL 100 MG PO TABS
100.0000 mg | ORAL_TABLET | Freq: Once | ORAL | Status: AC
  Administered 2018-04-19: 100 mg via ORAL
  Filled 2018-04-19 (×2): qty 1

## 2018-04-19 MED ORDER — ALBUTEROL SULFATE HFA 108 (90 BASE) MCG/ACT IN AERS: 2.0000 | INHALATION_SPRAY | Freq: Four times a day (QID) | RESPIRATORY_TRACT | Status: DC | PRN

## 2018-04-19 MED ORDER — PHENAZOPYRIDINE HCL 100 MG PO TABS
100.0000 mg | ORAL_TABLET | Freq: Three times a day (TID) | ORAL | Status: DC
  Administered 2018-04-20 – 2018-04-22 (×8): 100 mg via ORAL
  Filled 2018-04-19 (×15): qty 1

## 2018-04-19 MED ORDER — NORGESTIM-ETH ESTRAD TRIPHASIC 0.18/0.215/0.25 MG-25 MCG PO TABS
1.0000 | ORAL_TABLET | Freq: Every day | ORAL | Status: DC
  Administered 2018-04-20 – 2018-04-21 (×2): 1 via ORAL

## 2018-04-19 MED ORDER — ACETAMINOPHEN 325 MG PO TABS: 650.0000 mg | ORAL_TABLET | Freq: Four times a day (QID) | ORAL | Status: DC | PRN

## 2018-04-19 NOTE — Tx Team (Addendum)
Initial Treatment Plan 04/19/2018 8:21 PM Alexis [name] [id number]    PATIENT STRESSORS: Other: Argument with boyfriend   PATIENT STRENGTHS: Ability for insight Average or above average intelligence Capable of independent living Motivation for treatment/growth Supportive family/friends Work skills   PATIENT IDENTIFIED PROBLEMS: Depression   SI        " I want help to deal with chronic illness and to have better response to stressful situations."           DISCHARGE CRITERIA:  Need for constant or close observation no longer present  PRELIMINARY DISCHARGE PLAN: Return to previous living arrangement  PATIENT/FAMILY INVOLVEMENT: This treatment plan has been presented to and reviewed with the patient, Alexis [name], and/or family member.  The patient and family have been given the opportunity to ask questions and make suggestions.  Floyce StakesGarrison, Vernella Niznik, RN 04/19/2018, 8:21 PM

## 2018-04-19 NOTE — ED Notes (Signed)
TTS at bedside. 

## 2018-04-19 NOTE — ED Notes (Signed)
Poison Control has suggested the following for ED Provider and RN- Repeat acetaminophen if elevated about 150mg /dL. Repeat EKG before discharge. Monitor for seizures and treat with Benzodiazapine's. Monitor for anticholinergic effects (sedation, decreased urine output) Observation will be 6-8 hours if no symptoms present.

## 2018-04-19 NOTE — ED Notes (Signed)
Patient is attempting to provide urine specimen.  °

## 2018-04-19 NOTE — ED Notes (Signed)
Bed: WA18 Expected date:  Expected time:  Means of arrival:  Comments: ResA 

## 2018-04-19 NOTE — Progress Notes (Signed)
Per Elta GuadeloupeLaurie Parks, NP patient meets in patient criteria. [facility] has been [facility]. AC Lindsey, coordinating transportation and acceptance time.  Vilma MeckelEarl R. Algis GreenhouseForbes, MSW, LCSW Clinical Social Work/Disposition Phone: [phone] Fax: [phone]

## 2018-04-19 NOTE — ED Notes (Signed)
Pt ambulated to restroom with 1 staff assist.

## 2018-04-19 NOTE — ED Notes (Signed)
Spoke with [place] Groves[place] from poison control. Pt has been cleared by poison control at this time.

## 2018-04-19 NOTE — ED Triage Notes (Signed)
Patient is AOx4 and currently not ambulatory due to drugs taken. Patient attempted to commit suicide by taking an unknown amount of Hydroxyzine and and uknown amount of Diazepam. Patient is prescribed both and empty bottles are at bedside.  Patient stated she does not want to live due to having and feeling to much pressure from school, parents and friends. Patient states she is sick and that is why she attempted suicide as well.  Patient had surgery about 10 days ago. Hydrodistention.

## 2018-04-19 NOTE — ED Notes (Signed)
Patient has ambulated to restroom with Nurse Tech.

## 2018-04-19 NOTE — ED Notes (Signed)
ED Provider at bedside. 

## 2018-04-19 NOTE — [facility] Counselor (Signed)
Per Elta GuadeloupeLaurie Parks, NP patient meets in patient criteria. [facility]. AC Lindsey, coordinating transportation and acceptance time.

## 2018-04-19 NOTE — ED Notes (Signed)
Patient father provided additional information with patient's consent to discuss patient medical information and situation.   Patient father stated that patient and boyfriend had been in argument last night on April 18, 2018. Patient father does not think highly of boyfriend and believes that this may have been a cry for attention regarding patient and boyfriend. Patient did put down boyfriend on visitor list. Patient father stated that patient moved out and is living with boyfriend and female roommate who is also on visitor list.

## 2018-04-19 NOTE — ED Notes (Signed)
Bed: WA28 Expected date:  Expected time:  Means of arrival:  Comments: 

## 2018-04-19 NOTE — ED Provider Notes (Addendum)
[facility]-EMERGENCY DEPT Provider Note   CSN: [id number] Arrival date & time: 04/19/18  1050     History   Chief Complaint Chief Complaint  Patient presents with  . Suicide Attempt  . Drug Overdose    HPI Alexis [name] is a 17 y.o. female.  17 year old female who took an unknown amount of hydroxyzine and Valium approximately 45 minutes prior to arrival.  She admits that this was a suicide attempt.  Has had attempted suicide before in the past.  Denies any other coingestions.  States she has been under a great deal of stress and has been depressed but denies taking any active medications for depression.  No recent use of alcohol or illicit drugs.  Denies any auditory or visual hallucinations.  No homicidal ideations.  Told her father that she took the medications and as needed help and was driven here.     Past Medical History:  Diagnosis Date  . Anxiety   . Asthma   . Depression   . GERD (gastroesophageal reflux disease)   . Migraines     There are no active problems to display for this patient.   Past Surgical History:  Procedure Laterality Date  . CYSTO WITH HYDRODISTENSION N/A 04/10/2018   Procedure: CYSTOSCOPY/HYDRODISTENSION;  Surgeon: Crist FatHerrick, Benjamin W, MD;  [facility];  Service: Urology;  Laterality: N/A;  . UPPER GI ENDOSCOPY       OB History   None      Home Medications    Prior to Admission medications   Medication Sig Start Date End Date Taking? Authorizing Provider  albuterol (PROVENTIL HFA;VENTOLIN HFA) 108 (90 Base) MCG/ACT inhaler Inhale into the lungs every 6 (six) hours as needed for wheezing or shortness of breath.    [provider]  amitriptyline (ELAVIL) 25 MG tablet Take 25 mg by mouth at bedtime.    [provider]  diazepam (VALIUM) 10 MG tablet Take 10 mg by mouth every 6 (six) hours as needed.     [provider]  hydrOXYzine (ATARAX/VISTARIL) 50 MG tablet  Take 50 mg by mouth every evening.    [provider]  meloxicam (MOBIC) 15 MG tablet Take 15 mg by mouth daily.    [provider]  metroNIDAZOLE (FLAGYL) 500 MG tablet Take 1 tablet (500 mg total) by mouth 2 (two) times daily. 09/30/17   Joy, Shawn C, PA-C  phenazopyridine (PYRIDIUM) 100 MG tablet Take 100 mg by mouth 3 (three) times daily.    [provider]  phenazopyridine (PYRIDIUM) 200 MG tablet Take 1 tablet (200 mg total) by mouth 3 (three) times daily as needed for pain. 04/10/18   Crist FatHerrick, Benjamin W, MD  traMADol (ULTRAM) 50 MG tablet Take 1-2 tablets (50-100 mg total) by mouth every 6 (six) hours as needed for moderate pain. 04/10/18   Crist FatHerrick, Benjamin W, MD  TRI-LO-MARZIA 0.18/0.215/0.25 MG-25 MCG tab Take 1 tablet by mouth daily.  09/23/17   [provider]    Family History No family history on file.  Social History Social History   Tobacco Use  . Smoking status: Never Smoker  . Smokeless tobacco: Never Used  Substance Use Topics  . Alcohol use: Never    Frequency: Never  . Drug use: Never     Allergies   Other   Review of Systems Review of Systems  All other systems reviewed and are negative.    Physical Exam Updated Vital Signs BP 125/87  Pulse (!) 113   Temp 98 F (36.7 C)   Resp 14   Ht (S) 1.676 m (5\' 6" )   Wt (S) 49.9 kg   LMP 04/08/2018   SpO2 96%   BMI 17.75 kg/m   Physical Exam  Constitutional: She is oriented to person, place, and time. She appears well-developed and well-nourished.  Non-toxic appearance. No distress.  HENT:  Head: Normocephalic and atraumatic.  Eyes: Pupils are equal, round, and reactive to light. Conjunctivae, EOM and lids are normal.  Neck: Normal range of motion. Neck supple. No tracheal deviation present. No thyroid mass present.  Cardiovascular: Regular rhythm and normal heart sounds. Tachycardia present. Exam reveals no gallop.  No murmur heard. Pulmonary/Chest: Effort normal  and breath sounds normal. No stridor. No respiratory distress. She has no decreased breath sounds. She has no wheezes. She has no rhonchi. She has no rales.  Abdominal: Soft. Normal appearance and bowel sounds are normal. She exhibits no distension. There is no tenderness. There is no rebound and no CVA tenderness.  Musculoskeletal: Normal range of motion. She exhibits no edema or tenderness.  Neurological: She is alert and oriented to person, place, and time. She has normal strength. No cranial nerve deficit or sensory deficit. GCS eye subscore is 4. GCS verbal subscore is 5. GCS motor subscore is 6.  Skin: Skin is warm and dry. No abrasion and no rash noted.  Psychiatric: Her speech is delayed. She is withdrawn. She is not actively hallucinating. She exhibits a depressed mood. She expresses suicidal ideation. She expresses suicidal plans.  Nursing note and vitals reviewed.    ED Treatments / Results  Labs (all labs ordered are listed, but only abnormal results are displayed) Labs Reviewed  COMPREHENSIVE METABOLIC PANEL  ETHANOL  SALICYLATE LEVEL  ACETAMINOPHEN LEVEL  CBC  RAPID URINE DRUG SCREEN, HOSP PERFORMED  CBG MONITORING, ED  I-STAT BETA HCG BLOOD, ED ([facility], [facility], [facility] ONLY)    EKG EKG Interpretation  Date/Time:  Sunday April 19 2018 11:01:28 EST Ventricular Rate:  115 PR Interval:    QRS Duration: 97 QT Interval:  329 QTC Calculation: 455 R Axis:   71 Text Interpretation:  Sinus tachycardia RSR' in V1 or V2, right VCD or RVH Confirmed by Lorre Nick ([id number]) on 04/19/2018 11:20:00 AM   Radiology No results found.  Procedures Procedures (including critical care time)  Medications Ordered in ED Medications - No data to display   Initial Impression / Assessment and Plan / ED Course  I have reviewed the triage vital signs and the nursing notes.  Pertinent labs & imaging results that were available during my care of the patient were reviewed by me and considered  in my medical decision making (see chart for details).    Mild hyperkalemia noted treated with oral potassium She monitored here and is awake and alert and answering questions appropriately.  She is medically clear for psychiatric disposition.  Final Clinical Impressions(s) / ED Diagnoses   Final diagnoses:  None    ED Discharge Orders    None       Lorre Nick, MD 04/19/18 1422    Lorre Nick, MD 04/19/18 1422

## 2018-04-19 NOTE — ED Notes (Signed)
This nurse confirmed with EDP, psychiatry, and unit sec-pt is currently voluntary and signed for [facility] per MD order.

## 2018-04-19 NOTE — ED Notes (Addendum)
Pt to room #28, appears to be minimizing, limited insight. Pt reports she wants to rest, states she is here because she tried to kill herself. encouragement and support provided. Safety sitter present. Will continue to monitor.

## 2018-04-19 NOTE — ED Notes (Signed)
RN is Facilities managerCalling Poison Control.

## 2018-04-19 NOTE — ED Notes (Signed)
[facility] transport on unit [facility] MD order. Pt signed e-signature. Pt has no personal property. Ambulatory off unit.

## 2018-04-19 NOTE — BH Assessment (Signed)
Assessment Note  Alexis [name] is an 17 y.o. female [facility] following an intentional overdose on hydroxyzine and Valium. EDP currently initiating IVC. Patient was drowsy during assessment so was a poor historian. She was in and out of sleep so offered little information. Additional information for assessment was gathered from her father, Nida BoatmanBrad. 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 She lives in an off campus apartment with a friend and her boyfriend. She admitted that the overdose was intentional and to commit suicide. Patient stated was recently in a fight with her boyfriend that triggered the overdose. Patient also stated that she recently had a cystoscopy and has been experiencing a lot of pain, contributing to her depression.  Per collateral information: Patient has struggled with irregular mood, impulsivity, and depression since her early teen years. She has a strained relationship with her mother, who is diagnosed with Bipolar disorder and BPD. He has cared for her since she was [age] year old. When patient was 17 years old her 713 month old brother died of SIDS which was traumatic for patient. Patient does not have any prior psychiatric hospitalizations or suicide attempts. She is currently in outpatient therapy but neither patient nor her father are able to remember specifics about her treatment. Patient's father is concerned because he sees similar traits of borderline personality disorder as patient's mother. Patient denies HI/AVH. Patient denies any substance use. Patient does not have any criminal charges.  Diagnosis: F33.2 MDD, recurrent, severe  Past Medical History:  Past Medical History:  Diagnosis Date  . Anxiety   . Asthma   . Depression   . GERD (gastroesophageal reflux disease)   . Migraines     Past Surgical History:  Procedure Laterality Date  . CYSTO WITH HYDRODISTENSION N/A 04/10/2018   Procedure:  CYSTOSCOPY/HYDRODISTENSION;  Surgeon: Crist FatHerrick, Benjamin W, MD;  [facility];  Service: Urology;  Laterality: N/A;  . UPPER GI ENDOSCOPY      Family History: No family history on file.  Social History:  reports that she has never smoked. She has never used smokeless tobacco. She reports that she does not drink alcohol or use drugs.  Additional Social History:  Alcohol / Drug Use Pain Medications: see MAR Prescriptions: see MAR Over the Counter: see MAR History of alcohol / drug use?: No history of alcohol / drug abuse Longest period of sobriety (when/how long): patient denies  CIWA: CIWA-Ar BP: 107/77 Pulse Rate: (!) 109 COWS:    Allergies:  Allergies  Allergen Reactions  . Other Other (See Comments)    All acidic foods cause extreme bladder pain    Home Medications:  (Not in a hospital admission)  OB/GYN Status:  Patient's last menstrual period was 04/08/2018.  General Assessment Data Location of Assessment: [facility] TTS Assessment: In system Is this a Tele or Face-to-Face Assessment?: Face-to-Face Is this an Initial Assessment or a Re-assessment for this encounter?: Initial Assessment Patient Accompanied by:: Parent(father, Nida BoatmanBrad) Language Other than English: No Living Arrangements: (apartment) What gender do you identify as?: Female Marital status: Single Pregnancy Status: No Living Arrangements: Non-relatives/Friends, Spouse/significant other Can pt return to current living arrangement?: Yes Admission Status: Involuntary(IVC being initiated) Petitioner: ED Attending Is patient capable of signing voluntary admission?: No Referral Source: Self/Family/Friend Insurance type: Mediciad     Crisis Care Plan Living Arrangements: Non-relatives/Friends, Spouse/significant other  Education Status Is patient currently in school?: Yes Current Grade: Sophomore at [employer] Highest  grade of school patient has completed: freshman year of college Name of  school: [employer]  Risk to self with the past 6 months Suicidal Ideation: Yes-Currently Present Has patient been a risk to self within the past 6 months prior to admission? : No Suicidal Intent: No Has patient had any suicidal intent within the past 6 months prior to admission? : No Is patient at risk for suicide?: Yes Suicidal Plan?: Yes-Currently Present Has patient had any suicidal plan within the past 6 months prior to admission? : Yes Specify Current Suicidal Plan: patient OD Access to Means: Yes Specify Access to Suicidal Means: prscription medications What has been your use of drugs/alcohol within the last 12 months?: pt denies Previous Attempts/Gestures: No How many times?: 0 Other Self Harm Risks: scratching Triggers for Past Attempts: (medical concerns) Intentional Self Injurious Behavior: (scratching wrists) Family Suicide History: No Recent stressful life event(s): Recent negative physical changes Persecutory voices/beliefs?: No Depression: Yes Depression Symptoms: Feeling angry/irritable, Feeling worthless/self pity, Loss of interest in usual pleasures, Isolating, Tearfulness, Insomnia Substance abuse history and/or treatment for substance abuse?: No Suicide prevention information given to non-admitted patients: Not applicable  Risk to Others within the past 6 months Homicidal Ideation: No Does patient have any lifetime risk of violence toward others beyond the six months prior to admission? : No Thoughts of Harm to Others: No Current Homicidal Intent: No Current Homicidal Plan: No Access to Homicidal Means: No Identified Victim: none History of harm to others?: No Assessment of Violence: None Noted Violent Behavior Description: none reported Does patient have access to weapons?: No Criminal Charges Pending?: No Does patient have a court date: No Is patient on probation?: No  Psychosis Hallucinations: None noted Delusions: None noted  Mental Status  Report Appearance/Hygiene: In scrubs Eye Contact: Poor Motor Activity: Unremarkable Speech: Slurred Level of Consciousness: Drowsy Mood: Depressed Affect: Depressed Anxiety Level: Minimal Thought Processes: Circumstantial Judgement: Impaired Orientation: Person, Place, Time, Situation Obsessive Compulsive Thoughts/Behaviors: None  Cognitive Functioning Concentration: Poor Memory: Recent Intact, Remote Intact Is patient IDD: No Insight: Poor Impulse Control: Poor Appetite: Poor Have you had any weight changes? : No Change Sleep: Unable to Assess Total Hours of Sleep: (UTA) Vegetative Symptoms: None  ADLScreening [facility] Assessment Services) Patient's cognitive ability adequate to safely complete daily activities?: Yes Patient able to express need for assistance with ADLs?: Yes Independently performs ADLs?: Yes (appropriate for developmental age)  Prior Inpatient Therapy Prior Inpatient Therapy: No  Prior Outpatient Therapy Prior Outpatient Therapy: Yes Prior Therapy Dates: current Prior Therapy Facilty/Provider(s): (UTA) Reason for Treatment: depression Does patient have an ACCT team?: No Does patient have Intensive In-House Services?  : No Does patient have [facility] services? : No Does patient have P4CC services?: No  ADL Screening (condition at time of admission) Patient's cognitive ability adequate to safely complete daily activities?: Yes Is the patient deaf or have difficulty hearing?: No Does the patient have difficulty seeing, even when wearing glasses/contacts?: No Does the patient have difficulty concentrating, remembering, or making decisions?: No Patient able to express need for assistance with ADLs?: Yes Does the patient have difficulty dressing or bathing?: No Independently performs ADLs?: Yes (appropriate for developmental age) Does the patient have difficulty walking or climbing stairs?: No Weakness of Legs: None Weakness of Arms/Hands: None  Home  Assistive Devices/Equipment Home Assistive Devices/Equipment: Eyeglasses(inhaler prn)  Therapy Consults (therapy consults require a physician order) PT Evaluation Needed: No OT Evalulation Needed: No SLP Evaluation Needed: No Abuse/Neglect Assessment (Assessment to be  complete while patient is alone) Physical Abuse: Denies Verbal Abuse: Denies Sexual Abuse: Denies Exploitation of patient/patient's resources: Denies Self-Neglect: Denies Values / Beliefs Cultural Requests During Hospitalization: None Spiritual Requests During Hospitalization: None Consults Spiritual Care Consult Needed: No Social Work Consult Needed: No Merchant navy officer (For Healthcare) Does Patient Have a Medical Advance Directive?: Yes          Disposition: Per Elta Guadeloupe, NP patient meets in patient criteria. Disposition Initial Assessment Completed for this Encounter: Yes  On Site Evaluation by:   Reviewed with Physician:    Celedonio Miyamoto 04/19/2018 3:45 PM

## 2018-04-19 NOTE — ED Notes (Signed)
ED Provider at bedside. ED Provider informed patient that she is voluntary however if patient attempts to leave ED Provider will Involuntarily commit patient. Patient understands and is AOx4.

## 2018-04-19 NOTE — ED Triage Notes (Signed)
Patient denies being abused physical or sexually by family and feels safe at home.

## 2018-04-19 NOTE — ED Notes (Signed)
Family at bedside. 

## 2018-04-19 NOTE — ED Notes (Signed)
One pair of shoe, green jacket, black pants, bra, cell phone. PT belonging given to father

## 2018-04-19 NOTE — ED Notes (Signed)
Pt talking on hospital phone, safety sitter present.

## 2018-04-20 DIAGNOSIS — F419 Anxiety disorder, unspecified: Secondary | ICD-10-CM

## 2018-04-20 DIAGNOSIS — F332 Major depressive disorder, recurrent severe without psychotic features: Principal | ICD-10-CM

## 2018-04-20 MED ORDER — PHENAZOPYRIDINE HCL 100 MG PO TABS: 200.0000 mg | ORAL_TABLET | Freq: Every day | ORAL | Status: DC | PRN

## 2018-04-20 MED ORDER — TRAMADOL HCL 50 MG PO TABS
50.0000 mg | ORAL_TABLET | Freq: Two times a day (BID) | ORAL | Status: DC | PRN
  Administered 2018-04-21: 50 mg via ORAL
  Filled 2018-04-20 (×2): qty 1

## 2018-04-20 MED ORDER — SERTRALINE HCL 25 MG PO TABS
25.0000 mg | ORAL_TABLET | Freq: Every day | ORAL | Status: DC
  Administered 2018-04-20 – 2018-04-22 (×3): 25 mg via ORAL
  Filled 2018-04-20 (×6): qty 1

## 2018-04-20 MED ORDER — HYDROXYZINE HCL 25 MG PO TABS
25.0000 mg | ORAL_TABLET | Freq: Three times a day (TID) | ORAL | Status: DC | PRN
  Administered 2018-04-20: 25 mg via ORAL
  Filled 2018-04-20: qty 1

## 2018-04-20 MED ORDER — ALBUTEROL SULFATE HFA 108 (90 BASE) MCG/ACT IN AERS: 1.0000 | INHALATION_SPRAY | Freq: Four times a day (QID) | RESPIRATORY_TRACT | Status: DC | PRN

## 2018-04-20 MED ORDER — FAMOTIDINE 20 MG PO TABS
20.0000 mg | ORAL_TABLET | Freq: Every day | ORAL | Status: DC
  Administered 2018-04-20 – 2018-04-22 (×3): 20 mg via ORAL
  Filled 2018-04-20 (×6): qty 1

## 2018-04-20 MED ORDER — DIAZEPAM 5 MG PO TABS
10.0000 mg | ORAL_TABLET | Freq: Every day | ORAL | Status: DC
  Administered 2018-04-20 – 2018-04-21 (×2): 10 mg via ORAL
  Filled 2018-04-20 (×2): qty 2

## 2018-04-20 MED ORDER — MELOXICAM 7.5 MG PO TABS
15.0000 mg | ORAL_TABLET | Freq: Every day | ORAL | Status: DC
  Administered 2018-04-20 – 2018-04-22 (×3): 15 mg via ORAL
  Filled 2018-04-20 (×2): qty 1
  Filled 2018-04-20: qty 2
  Filled 2018-04-20 (×2): qty 1
  Filled 2018-04-20 (×2): qty 2

## 2018-04-20 NOTE — Progress Notes (Signed)
  [facility] INPATIENT: Family/Significant Other Suicide Prevention Education ? Suicide Prevention Education:  Education Completed; boyfriend, Loretha BrasilZack Brown [phone] has been identified by the patient as the family member/significant other with whom the patient will be residing, and identified as the person(s) who will aid the patient in the event of a mental health crisis (suicidal ideations/suicide attempt). With written consent from the patient, the family member/significant other has been provided the following suicide prevention education, prior to the and/or following the discharge of the patient. ? The suicide prevention education provided includes the following: Suicide risk factors  Suicide prevention and interventions  National Suicide Hotline telephone number  [facility] assessment telephone number  [facility] Emergency Assistance 911  [facility] and/or [facility] Crisis [facility] telephone number   Request made of family/significant other to: Remove weapons (e.g., guns, rifles, knives), all items previously/currently identified as safety concern.  Remove drugs/medications (over-the-counter, prescriptions, illicit drugs), all items previously/currently identified as a safety concern.  The family member/significant other verbalizes understanding of the suicide prevention education information provided. The family member/significant other agrees to remove the items of safety concern listed above.  Patient's boyfriend states that the patient has been struggling with her physical health and depression for "sometime now," and the patient has endorsed passive SI without a plan. Per boyfriend, patient and boyfriend had an argument, the boyfriend left their shared apartment, and the patient attempted suicide by overdosing; boyfriend endorses that this attempt was likely impulsive.   Boyfriend expressed understanding of suicide prevention information and resources available  including mobile crisis. No further questions for CSW at this time.  Enid Cutterharlotte Osiel Stick, LCSW-A Clinical Social Worker

## 2018-04-20 NOTE — Progress Notes (Signed)
Recreation Therapy Notes Date: 11.25.19 Time: 0930 Location: [address] Dayroom  Group Topic: Stress Management  Goal Area(s) Addresses:  Patient will verbalize importance of using healthy stress management.  Patient will identify positive emotions associated with healthy stress management.   Intervention: Stress Management  Activity :  Guided Imagery.  LRT introduced the stress management technique of guided imagery to patients.  LRT read a script that allowed patients to envision being outside looking at a starry sky at night.  Patients were to listen and follow along as script was read to engage in activity.  Education: Stress Management, Discharge Planning.   Education Outcome: Acknowledges edcuation/In group clarification offered/Needs additional education  Clinical Observations/Feedback: Pt did not attend group.     Caroll RancherMarjette Franklyn Cafaro, LRT/CTRS    Caroll RancherLindsay, Arch Methot A 04/20/2018 11:24 AM

## 2018-04-20 NOTE — Progress Notes (Signed)
Adult Psychoeducational Group Note  Date:  04/20/2018 Time:  8:43 PM  Group Topic/Focus:  Wrap-Up Group:   The focus of this group is to help patients review their daily goal of treatment and discuss progress on daily workbooks.  Participation Level:  Active  Participation Quality:  Intrusive  Affect:  Appropriate  Cognitive:  Alert  Insight: Appropriate  Engagement in Group:  Engaged  Modes of Intervention:  Discussion and Support  Additional Comments:  Pt was active in group. Pt said she had a good day because she was started on anti depressants. Pt discussed her dietary needs and asked about food accommodations. Notified Pt's nurse of Pt's requests.  Ova FreshwaterChristina Ronal Maybury 04/20/2018, 8:43 PM

## 2018-04-20 NOTE — Plan of Care (Signed)
  Problem: Coping: Goal: Coping ability will improve Outcome: Progressing Goal: Will verbalize feelings Outcome: Progressing   D: Pt alert and oriented on the unit. Pt engaging with RN staff and other pts., and participated during unit groups and activities. Pt denies SI/HI, A/VH. Pt is pleasant and cooperative. Pt was ambulatory on the unit with steady gait. Pt's goal for the day is "to find out a medication that can help." A: Education, support and encouragement provided, q15 minute safety checks remain in effect. Medications administered per MD orders. R: No reactions/side effects to medicine noted. Pt denies any concerns at this time, and verbally contracts for safety. Pt ambulating on the unit with no issues. Pt remains safe on and off the unit.
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
## 2018-04-20 NOTE — Progress Notes (Signed)
Patient admitted voluntarily after an overdose on vistaril and valium. She reported that she and her boyfriend whom she lives with had an argument. She reports feeling  depressed for a while. She also reported that she is in constant pain d/t her interstitial cystitis. She is a sophomore at [employer]. She was drowsy and unsteady on her feet during her admission. Skin assessment completed on admission. She was oriented to unit, encouraged to rest and use her call bell if in need of anything. She also reports that she has an allergy to amy acidic foods. Safety maintained on unit with 15 min checks.
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
## 2018-04-20 NOTE — Tx Team (Signed)
Interdisciplinary Treatment and Diagnostic Plan Update  04/20/2018 Time of Session:  Alexis [name] MRN: [id number]  Principal Diagnosis: <principal problem not specified>  Secondary Diagnoses: Active Problems:   MDD (major depressive disorder), recurrent severe, without psychosis ([place])   Current Medications:  Current Facility-Administered Medications  Medication Dose Route Frequency Provider Last Rate Last Dose  . acetaminophen (TYLENOL) tablet 650 mg  650 mg Oral Q6H PRN Ethelene Hal, NP      . albuterol (PROVENTIL HFA;VENTOLIN HFA) 108 (90 Base) MCG/ACT inhaler 1-2 puff  1-2 puff Inhalation Q6H PRN Sharma Covert, MD      . alum & mag hydroxide-simeth (MAALOX/MYLANTA) 200-200-20 MG/5ML suspension 30 mL  30 mL Oral Q4H PRN Ethelene Hal, NP      . diazepam (VALIUM) tablet 10 mg  10 mg Oral QHS Sharma Covert, MD      . famotidine (PEPCID) tablet 20 mg  20 mg Oral Daily Sharma Covert, MD   20 mg at 04/20/18 0932  . hydrOXYzine (ATARAX/VISTARIL) tablet 25 mg  25 mg Oral TID PRN Sharma Covert, MD      . magnesium hydroxide (MILK OF MAGNESIA) suspension 30 mL  30 mL Oral Daily PRN Ethelene Hal, NP      . meloxicam [facility]) tablet 15 mg  15 mg Oral Daily Sharma Covert, MD   15 mg at 04/20/18 0932  . Norgestimate-Ethinyl Estradiol Triphasic 0.18/0.215/0.25 MG-25 MCG tablet 1 tablet  1 tablet Oral QHS Ethelene Hal, NP      . phenazopyridine (PYRIDIUM) tablet 100 mg  100 mg Oral TID Ethelene Hal, NP   100 mg at 04/20/18 1313  . phenazopyridine (PYRIDIUM) tablet 200 mg  200 mg Oral Daily PRN Sharma Covert, MD      . sertraline (ZOLOFT) tablet 25 mg  25 mg Oral Daily Sharma Covert, MD   25 mg at 04/20/18 0932  . traMADol (ULTRAM) tablet 50 mg  50 mg Oral Q12H PRN Sharma Covert, MD      . traZODone (DESYREL) tablet 50 mg  50 mg Oral QHS PRN Ethelene Hal, NP       PTA Medications: Medications Prior to  Admission  Medication Sig Dispense Refill Last Dose  . albuterol (PROVENTIL HFA;VENTOLIN HFA) 108 (90 Base) MCG/ACT inhaler Inhale 2 puffs into the lungs every 6 (six) hours as needed for wheezing or shortness of breath.    Past Week at Unknown time  . diazepam (VALIUM) 10 MG tablet Take 10 mg by mouth every 6 (six) hours as needed for anxiety.    04/18/2018 at Unknown time  . hydrOXYzine (ATARAX/VISTARIL) 50 MG tablet Take 50 mg by mouth every evening.   04/19/2018 at Unknown time  . meloxicam (MOBIC) 15 MG tablet Take 15 mg by mouth daily.   04/18/2018 at Unknown time  . phenazopyridine (PYRIDIUM) 100 MG tablet Take 100 mg by mouth 3 (three) times daily.   04/19/2018 at Unknown time  . traMADol (ULTRAM) 50 MG tablet Take 1-2 tablets (50-100 mg total) by mouth every 6 (six) hours as needed for moderate pain. 30 tablet 0 Past Week at Unknown time  . TRI-LO-MARZIA 0.18/0.215/0.25 MG-25 MCG tab Take 1 tablet by mouth at bedtime.   11 04/18/2018 at Unknown time  . phenazopyridine (PYRIDIUM) 200 MG tablet Take 1 tablet (200 mg total) by mouth 3 (three) times daily as needed for pain. 10 tablet 0 04/19/2018 at Unknown time  Patient Stressors: Other: Argument with boyfriend  Patient Strengths: Ability for insight Average or above average intelligence Capable of independent living Motivation for treatment/growth Supportive family/friends Work skills  Treatment Modalities: Medication Management, Group therapy, Case management,  1 to 1 session with clinician, Psychoeducation, Recreational therapy.   Physician Treatment Plan for Primary Diagnosis: <principal problem not specified> Long Term Goal(s): Improvement in symptoms so as ready for discharge Improvement in symptoms so as ready for discharge   Short Term Goals: Ability to identify changes in lifestyle to reduce recurrence of condition will improve Ability to verbalize feelings will improve Ability to disclose and discuss suicidal  ideas Ability to demonstrate self-control will improve Ability to identify and develop effective coping behaviors will improve Ability to maintain clinical measurements within normal limits will improve Ability to identify changes in lifestyle to reduce recurrence of condition will improve Ability to verbalize feelings will improve Ability to disclose and discuss suicidal ideas Ability to demonstrate self-control will improve Ability to identify and develop effective coping behaviors will improve Ability to maintain clinical measurements within normal limits will improve  Medication Management: Evaluate patient's response, side effects, and tolerance of medication regimen.  Therapeutic Interventions: 1 to 1 sessions, Unit Group sessions and Medication administration.  Evaluation of Outcomes: Not Met  Physician Treatment Plan for Secondary Diagnosis: Active Problems:   MDD (major depressive disorder), recurrent severe, without psychosis ([place])  Long Term Goal(s): Improvement in symptoms so as ready for discharge Improvement in symptoms so as ready for discharge   Short Term Goals: Ability to identify changes in lifestyle to reduce recurrence of condition will improve Ability to verbalize feelings will improve Ability to disclose and discuss suicidal ideas Ability to demonstrate self-control will improve Ability to identify and develop effective coping behaviors will improve Ability to maintain clinical measurements within normal limits will improve Ability to identify changes in lifestyle to reduce recurrence of condition will improve Ability to verbalize feelings will improve Ability to disclose and discuss suicidal ideas Ability to demonstrate self-control will improve Ability to identify and develop effective coping behaviors will improve Ability to maintain clinical measurements within normal limits will improve     Medication Management: Evaluate patient's response, side  effects, and tolerance of medication regimen.  Therapeutic Interventions: 1 to 1 sessions, Unit Group sessions and Medication administration.  Evaluation of Outcomes: Not Met   RN Treatment Plan for Primary Diagnosis: <principal problem not specified> Long Term Goal(s): Knowledge of disease and therapeutic regimen to maintain health will improve  Short Term Goals: Ability to participate in decision making will improve, Ability to verbalize feelings will improve, Ability to disclose and discuss suicidal ideas and Ability to identify and develop effective coping behaviors will improve  Medication Management: RN will administer medications as ordered by provider, will assess and evaluate patient's response and provide education to patient for prescribed medication. RN will report any adverse and/or side effects to prescribing provider.  Therapeutic Interventions: 1 on 1 counseling sessions, Psychoeducation, Medication administration, Evaluate responses to treatment, Monitor vital signs and CBGs as ordered, Perform/monitor CIWA, COWS, AIMS and Fall Risk screenings as ordered, Perform wound care treatments as ordered.  Evaluation of Outcomes: Not Met   LCSW Treatment Plan for Primary Diagnosis: <principal problem not specified> Long Term Goal(s): Safe transition to appropriate next level of care at discharge, Engage patient in therapeutic group addressing interpersonal concerns.  Short Term Goals: Engage patient in aftercare planning with referrals and resources  Therapeutic Interventions: Assess for all discharge  needs, 1 to 1 time with Education officer, museum, Explore available resources and support systems, Assess for adequacy in community support network, Educate family and significant other(s) on suicide prevention, Complete Psychosocial Assessment, Interpersonal group therapy.  Evaluation of Outcomes: Not Met   Progress in Treatment: Attending groups: No. Participating in groups: No. Taking  medication as prescribed: Yes. Toleration medication: Yes. Family/Significant other contact made: Yes, individual(s) contacted:  patient's boyfriend Patient understands diagnosis: Yes. Discussing patient identified problems/goals with staff: Yes. Medical problems stabilized or resolved: Yes. Denies suicidal/homicidal ideation: Yes. Issues/concerns per patient self-inventory: No. Other:   New problem(s) identified: None   New Short Term/Long Term Goal(s): medication stabilization, elimination of SI thoughts, development of comprehensive mental wellness plan.    Patient Goals:  I want help to deal with chronic illness and to have better response to stressful situations."   Discharge Plan or Barriers: CSW will assess for appropriate referrals and discharge planning.    Reason for Continuation of Hospitalization: Anxiety Depression Medication stabilization Suicidal ideation  Estimated Length of Stay: 3-5 days  Attendees: Patient: 04/20/2018 3:48 PM  Physician: Dr. Myles Lipps, MD 04/20/2018 3:48 PM  Nursing: Elberta Fortis.A, RN 04/20/2018 3:48 PM  RN Care Manager: 04/20/2018 3:48 PM  Social Worker: Radonna Ricker, [place] 04/20/2018 3:48 PM  Recreational Therapist:  04/20/2018 3:48 PM  Other:  04/20/2018 3:48 PM  Other:  04/20/2018 3:48 PM  Other: 04/20/2018 3:48 PM    Scribe for Treatment Team: Marylee Floras, [place] 04/20/2018 3:48 PM
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
## 2018-04-21 MED ORDER — TRAMADOL HCL 50 MG PO TABS
50.0000 mg | ORAL_TABLET | Freq: Three times a day (TID) | ORAL | Status: DC | PRN
  Administered 2018-04-21: 50 mg via ORAL
  Filled 2018-04-21: qty 1

## 2018-04-21 NOTE — Progress Notes (Signed)
D:  Alexis [name] was up and visible on the unit.  She was playful with peers.  Animated in her interactions.  She denied SI/HI or A/V hallucinations.  She feels that her medications have been helpful since starting her Zoloft.  She denied any pain or discomfort and appears to be in no physical distress.  She hopes to be home for thanksgiving.  She is currently resting with her eyes closed and appears to be asleep. A:  1:1 with RN for support and encouragement.  Medications as ordered.  Q 15 minute checks maintained for safety.  Encouraged participation in group and unit activities.   R:  Alexis [name] remains safe on the unit.  We will continue to monitor the progress towards her goals.

## 2018-04-21 NOTE — BHH Group Notes (Signed)
Nursing Adult Psychoeducational Group Note  Date:  04/21/2018 Time:  4:00 PM  Group Topic/Focus: Self-Care Self Care:   The focus of this group is to help patients understand the importance of self-care in order to improve or restore emotional, physical, spiritual, interpersonal, and financial health.  Participation Level:  Active  Participation Quality:  Attentive  Affect:  Animated  Cognitive:  Oriented  Insight: Improving  Engagement in Group:  Developing/Improving  Modes of Intervention:  Activity, Discussion, Education and Socialization  Additional Comments:  Patient was appropriate throughout group and attentive to peers. Patient contributed positively to the discussion. Patient reports one self-care activity she will perform upon discharge is journaling.   Rae Lipsmanda A Ishani Goldwasser 04/21/2018, 4:45 PM

## 2018-04-21 NOTE — Progress Notes (Addendum)
[facility] MD Progress Note  04/21/2018 2:10 PM Alexis [name]  MRN:  [id number] Subjective: Patient reports feeling better than she did prior to admission.  Denies suicidal ideations and presents future oriented, hoping for discharge soon.  Currently focusing on food/dietary concerns.  States there are several food items she cannot eat because they worsen interstitial cystitis symptoms. Currently does not endorse medication side effects.  Objective: I have discussed case with treatment team and have met with patient. 17 year old female, reports history of anxiety and depression, presented to the emergency room following overdose on hydroxyzine and Valium.  She attributes overdose, which was impulsive/unplanned, to an argument with her boyfriend. She describes a long history of interstitial cystitis, which results and chronic pain and discomfort for which she takes Valium daily (suppository) and Ultram PRNs.  Patient expresses concern about food options offered in cafeteria today-states she ate a grilled sandwich- states there are a number of foods that she is not expressly allergic to but which tend to worsen her interstitial cystitis-these include spicy foods, tomato, citrus or acidic fruits, fermented foods such as yogurt, smoke or processed meats/foods.  She states she can eat most vegetables, rice, potatoes, chicken, beef (not seasoned), milk, bread.  She describes improving stressors and states she had a good visit with boyfriend, and that the relationship is now improved.  As above, she is hoping for discharge soon, looking forward to spending holiday with boyfriend.  Currently on Zoloft which she is tolerating well thus far.  Continues Valium 10 mg daily.  Takes Pyridium for urinary symptoms.  Takes Ultram for pain as needed basis.  Denies side effects, denies benzodiazepine or Ultram abuse or misuse. Visible on unit, interacting appropriately with peers of about her age 25/22 EKG - QTc 455 Principal  Problem: MDD Diagnosis: Active Problems:   MDD (major depressive disorder), recurrent severe, without psychosis ([place])  Total Time spent with patient: 20 minutes  Past Psychiatric History:   Past Medical History:  Past Medical History:  Diagnosis Date  . Anxiety   . Asthma   . Depression   . GERD (gastroesophageal reflux disease)   . Migraines     Past Surgical History:  Procedure Laterality Date  . CYSTO WITH HYDRODISTENSION N/A 04/10/2018   Procedure: CYSTOSCOPY/HYDRODISTENSION;  Surgeon: Ardis Hughs, MD;  [facility];  Service: Urology;  Laterality: N/A;  . UPPER GI ENDOSCOPY     Family History: History reviewed. No pertinent family history. Family Psychiatric  History:  Social History:  Social History   Substance and Sexual Activity  Alcohol Use Never  . Frequency: Never     Social History   Substance and Sexual Activity  Drug Use Never    Social History   Socioeconomic History  . Marital status: Single    Spouse name: Not on file  . Number of children: 0  . Years of education: 40  . Highest education level: Some college, no degree  Occupational History  . Not on file  Social Needs  . Financial resource strain: Patient refused  . Food insecurity:    Worry: Patient refused    Inability: Patient refused  . Transportation needs:    Medical: Patient refused    Non-medical: Patient refused  Tobacco Use  . Smoking status: Never Smoker  . Smokeless tobacco: Never Used  Substance and Sexual Activity  . Alcohol use: Never    Frequency: Never  . Drug use: Never  . Sexual activity: Yes  Birth control/protection: Pill  Lifestyle  . Physical activity:    Days per week: Patient refused    Minutes per session: Patient refused  . Stress: Patient refused  Relationships  . Social connections:    Talks on phone: Patient refused    Gets together: Patient refused    Attends religious service: Patient refused    Active member  of club or organization: Patient refused    Attends meetings of clubs or organizations: Patient refused    Relationship status: Patient refused  Other Topics Concern  . Not on file  Social History Narrative  . Not on file   Additional Social History:   Sleep: Fair  Appetite:  Fair  Current Medications: Current Facility-Administered Medications  Medication Dose Route Frequency Provider Last Rate Last Dose  . acetaminophen (TYLENOL) tablet 650 mg  650 mg Oral Q6H PRN Ethelene Hal, NP      . albuterol (PROVENTIL HFA;VENTOLIN HFA) 108 (90 Base) MCG/ACT inhaler 1-2 puff  1-2 puff Inhalation Q6H PRN Sharma Covert, MD      . alum & mag hydroxide-simeth (MAALOX/MYLANTA) 200-200-20 MG/5ML suspension 30 mL  30 mL Oral Q4H PRN Ethelene Hal, NP      . diazepam (VALIUM) tablet 10 mg  10 mg Oral QHS Sharma Covert, MD   10 mg at 04/20/18 2128  . famotidine (PEPCID) tablet 20 mg  20 mg Oral Daily Sharma Covert, MD   20 mg at 04/21/18 0731  . hydrOXYzine (ATARAX/VISTARIL) tablet 25 mg  25 mg Oral TID PRN Sharma Covert, MD   25 mg at 04/20/18 2128  . magnesium hydroxide (MILK OF MAGNESIA) suspension 30 mL  30 mL Oral Daily PRN Ethelene Hal, NP      . meloxicam [facility]) tablet 15 mg  15 mg Oral Daily Sharma Covert, MD   15 mg at 04/21/18 0730  . Norgestimate-Ethinyl Estradiol Triphasic 0.18/0.215/0.25 MG-25 MCG tablet 1 tablet  1 tablet Oral QHS Ethelene Hal, NP   1 tablet at 04/20/18 2131  . phenazopyridine (PYRIDIUM) tablet 100 mg  100 mg Oral TID Ethelene Hal, NP   100 mg at 04/21/18 1205  . phenazopyridine (PYRIDIUM) tablet 200 mg  200 mg Oral Daily PRN Sharma Covert, MD      . sertraline (ZOLOFT) tablet 25 mg  25 mg Oral Daily Sharma Covert, MD   25 mg at 04/21/18 0730  . traMADol (ULTRAM) tablet 50 mg  50 mg Oral Q8H PRN Cobos, Myer Peer, MD      . traZODone (DESYREL) tablet 50 mg  50 mg Oral QHS PRN Ethelene Hal, NP        Lab Results: No results found for this or any previous visit (from the past 55 hour(s)).  Blood Alcohol level:  Lab Results  Component Value Date   ETH <10 94/70/9628    Metabolic Disorder Labs: No results found for: HGBA1C, MPG No results found for: PROLACTIN No results found for: CHOL, TRIG, HDL, CHOLHDL, VLDL, LDLCALC  Physical Findings: AIMS: Facial and Oral Movements Muscles of Facial Expression: None, normal Lips and Perioral Area: None, normal Jaw: None, normal Tongue: None, normal,Extremity Movements Upper (arms, wrists, hands, fingers): None, normal Lower (legs, knees, ankles, toes): None, normal, Trunk Movements Neck, shoulders, hips: None, normal, Overall Severity Severity of abnormal movements (highest score from questions above): None, normal Incapacitation due to abnormal movements: None, normal Patient's awareness of abnormal movements (rate  only patient's report): No Awareness, Dental Status Current problems with teeth and/or dentures?: No Does patient usually wear dentures?: No  CIWA:    COWS:     Musculoskeletal: Strength & Muscle Tone: within normal limits Gait & Station: normal Patient leans: N/A  Psychiatric Specialty Exam: Physical Exam  ROS no headache, no chest pain, no shortness of breath, reports chronic pelvic/GU discomfort/pain related to interstitial cystitis  Blood pressure 104/72, pulse (!) 122, temperature 98.2 F (36.8 C), temperature source Oral, resp. rate 14, height 5' 6"  (1.676 m), weight 49 kg, last menstrual period 04/08/2018.Body mass index is 17.43 kg/m.  General Appearance: Well Groomed  Eye Contact:  Good  Speech:  Normal Rate  Volume:  Normal  Mood:  Describes improving mood  Affect:  Appropriate, reactive, smiles at times appropriately during session  Thought Process:  Linear and Descriptions of Associations: Intact  Orientation:  Full (Time, Place, and Person)  Thought Content:  No hallucinations, no  delusions  Suicidal Thoughts:  No-currently denies suicidal or self-injurious ideations, denies homicidal or violent ideations, contracts for safety on unit  Homicidal Thoughts:  No  Memory:  Recent and remote grossly intact  Judgement:  Fair  Insight:  Fair  Psychomotor Activity:  Normal  Concentration:  Concentration: Good and Attention Span: Good  Recall:  Good  Fund of Knowledge:  Good  Language:  Good  Akathisia:  Negative  Handed:  Right  AIMS (if indicated):     Assets:  Communication Skills Desire for Improvement Resilience  ADL's:  Intact  Cognition:  WNL  Sleep:  Number of Hours: 5.5   Assessment -  17 year old female, reports history of anxiety and depression, presented to the emergency room following overdose on hydroxyzine and Valium.  She attributes overdose, which was impulsive/unplanned, to an argument with her boyfriend. Currently patient describes some improvement compared to admission.  At this time denies suicidal ideations and presents future oriented, hopeful for discharge soon.  Her affect presents reactive and full in range.  Currently tolerating Zoloft well, denies side effects thus far.  She describes chronic pain associated with interstitial cystitis.  Of note, 11/24 WBC low at 2.9.  Treatment Plan Summary: Daily contact with patient to assess and evaluate symptoms and progress in treatment, Medication management, Plan Inpatient treatment and Medications as below Encourage group and milieu participation to work on coping skills and symptom reduction. Continue Zoloft 25 mg daily for depression and anxiety D/C Trazodone Continue Valium 10 mgrs QHS for anxiety, history of IC Continue Pyridium for history of IC Continue Ultram at 50 mgrs Q 8 hours PRN for pain as needed  Monitor EKG , Check TSH, repeat CBC and BMP in AM Will review dietary issues/requests - see above- with nursing / team Treatment team working on disposition planning options.   Jenne Campus, MD 04/21/2018, 2:10 PM

## 2018-04-21 NOTE — Progress Notes (Signed)
D: Patient observed in dayroom this evening, interacting with peers. Father visited earlier which patient initially stated went well but later stated that father told her she was "just being dramatic." Patient became angry shortly after group stating there is nothing she can eat here. Patient went on to list several foods she cannot have - acidic/citrius foods, spices, peppers and so on. Patient encouraged to list those foods she can have so that staff could attempt to accommodate needs/preferences. "I can eat bacon but not sausage. I like peanut butter and honey sandwiches but not peanut butter and jelly." Patient did not appear to desire problem solving but rather focused negatively on what wasn't being provided to her. "It makes me angry. Can't you just order what I want?" Patient unable to identify why she had not mentioned this to staff since arriving 24 hours ago. Offered to provide salad but patient stated, "I can't have a salad if tomatoes have touched it." Patient states these foods will cause bladder spasms. Patient's affect labile with congruent mood. Patient splitting staff with multiple requests.   A: Medicated per orders, prn vistaril given per her request. Medication education provided. Level III obs in place for safety. Emotional support offered. Patient encouraged to complete Suicide Safety Plan before discharge. Encouraged to attend and participate in unit programming.  Fall prevention plan in place and reviewed with patient as pt is a high fall risk. Fluids provided and encouraged. Kind, enforceable limits set with patient. Encouraged to speak with Dr. Jama Flavorsobos tomorrow about her dietary requirements.  R: Patient verbalizes understanding of POC, falls prevention education. On reassess, patient was asleep. Patient denies SI/HI/AVH and remains safe on level III obs. Will continue to monitor throughout the night.

## 2018-04-21 NOTE — Progress Notes (Signed)
Patient denies SI, HI and AVH this shift.  Patient reported decreased depression and minimal anxiety.  Patient states she feels her medications as working and she is hopeful that she will continue to improve.  Patient has complaints of pain due to cystitis, patient was given tramadol without resolution to pain.   Assess patient for safety, offer medications as planned, engage patient in 1:1 staff talks.   Continue to monitor as planned.   Patient able to contract for safety.

## 2018-04-22 LAB — BASIC METABOLIC PANEL
Anion gap: 5 (ref 5–15)
BUN: 18 mg/dL (ref 6–20)
CO2: 29 mmol/L (ref 22–32)
Calcium: 8.8 mg/dL — ABNORMAL LOW (ref 8.9–10.3)
Chloride: 104 mmol/L (ref 98–111)
Creatinine, Ser: 0.69 mg/dL (ref 0.44–1.00)
GFR calc non Af Amer: >60 mL/min (ref >60)
Glucose, Bld: 84 mg/dL (ref 70–99)
Potassium: 3.5 mmol/L (ref 3.5–5.1)
SODIUM: 138 mmol/L (ref 135–145)

## 2018-04-22 LAB — CBC WITH DIFFERENTIAL/PLATELET
Abs Immature Granulocytes: 0.01 10*3/uL (ref 0.00–0.07)
BASOS ABS: 0 10*3/uL (ref 0.0–0.1)
Basophils Relative: 1 %
EOS PCT: 4 %
Eosinophils Absolute: 0.2 10*3/uL (ref 0.0–0.5)
HEMATOCRIT: 35 % — AB (ref 36.0–46.0)
Hemoglobin: 10.9 g/dL — ABNORMAL LOW (ref 12.0–15.0)
IMMATURE GRANULOCYTES: 0 %
LYMPHS ABS: 1.7 10*3/uL (ref 0.7–4.0)
Lymphocytes Relative: 41 %
MCH: 29.5 pg (ref 26.0–34.0)
MCHC: 31.1 g/dL (ref 30.0–36.0)
MCV: 94.9 fL (ref 80.0–100.0)
MONOS PCT: 11 %
Monocytes Absolute: 0.5 10*3/uL (ref 0.1–1.0)
NEUTROS ABS: 1.8 10*3/uL (ref 1.7–7.7)
NEUTROS PCT: 43 %
NRBC: 0 % (ref 0.0–0.2)
Platelets: 173 10*3/uL (ref 150–400)
RBC: 3.69 MIL/uL — ABNORMAL LOW (ref 3.87–5.11)
RDW: 11.5 % (ref 11.5–15.5)
WBC: 4.2 10*3/uL (ref 4.0–10.5)

## 2018-04-22 LAB — TSH: TSH: 1.469 u[IU]/mL (ref 0.350–4.500)

## 2018-04-22 MED ORDER — HYDROXYZINE HCL 25 MG PO TABS: 25.0000 mg | ORAL_TABLET | Freq: Three times a day (TID) | ORAL | 0 refills | Status: DC | PRN

## 2018-04-22 MED ORDER — PHENAZOPYRIDINE HCL 200 MG PO TABS: 200.0000 mg | ORAL_TABLET | Freq: Every day | ORAL | 0 refills | Status: DC | PRN

## 2018-04-22 MED ORDER — MELOXICAM 15 MG PO TABS: 15.0000 mg | ORAL_TABLET | Freq: Every day | ORAL | 0 refills | Status: DC

## 2018-04-22 MED ORDER — HYDROXYZINE HCL 25 MG PO TABS: 25.0000 mg | ORAL_TABLET | Freq: Three times a day (TID) | ORAL | Status: DC | PRN

## 2018-04-22 MED ORDER — SERTRALINE HCL 25 MG PO TABS: 25.0000 mg | ORAL_TABLET | Freq: Every day | ORAL | 0 refills | Status: DC

## 2018-04-22 MED ORDER — DIAZEPAM 10 MG PO TABS: 10.0000 mg | ORAL_TABLET | Freq: Every day | ORAL | 0 refills | Status: DC

## 2018-04-22 MED ORDER — ALBUTEROL SULFATE HFA 108 (90 BASE) MCG/ACT IN AERS: 1.0000 | INHALATION_SPRAY | Freq: Four times a day (QID) | RESPIRATORY_TRACT | Status: AC | PRN

## 2018-04-22 MED ORDER — NORGESTIM-ETH ESTRAD TRIPHASIC 0.18/0.215/0.25 MG-25 MCG PO TABS: 1.0000 | ORAL_TABLET | Freq: Every day | ORAL | 11 refills | Status: AC

## 2018-04-22 MED ORDER — TRAMADOL HCL 50 MG PO TABS: 50.0000 mg | ORAL_TABLET | Freq: Three times a day (TID) | ORAL | 0 refills | Status: DC | PRN

## 2018-04-22 MED ORDER — PHENAZOPYRIDINE HCL 100 MG PO TABS: 100.0000 mg | ORAL_TABLET | Freq: Three times a day (TID) | ORAL | 0 refills | Status: AC

## 2018-04-22 MED ORDER — FAMOTIDINE 20 MG PO TABS: 20.0000 mg | ORAL_TABLET | Freq: Every day | ORAL | 0 refills | Status: DC

## 2018-04-22 NOTE — [facility] Group Notes (Signed)
[facility]      04/22/2018 11:18 AM  Type of Therapy: Mental Health Association Presentation  Participation Level: Active  Participation Quality: Attentive  Affect: Appropriate  Cognitive: Oriented  Insight: Developing/Improving  Engagement in Therapy: Engaged  Modes of Intervention: Discussion, Education and Socialization  Summary of Progress/Problems: Mental Health Association (MHA) Speaker came to talk about his personal journey with mental health. The pt processed ways by which to relate to the speaker. MHA speaker provided handouts and educational information pertaining to groups and services offered by the [facility]. Pt was engaged in speaker's presentation and was receptive to resources provided.    Alcario DroughtJolan Yasser Hepp LCSWA Clinical Social Worker

## 2018-04-22 NOTE — [facility] Group Notes (Signed)
Adult Psychoeducational Group Note  Date:  04/22/2018 Time:  9:08 AM  Group Topic/Focus:  Goals Group:   The focus of this group is to help patients establish daily goals to achieve during treatment and discuss how the patient can incorporate goal setting into their daily lives to aide in recovery.  Participation Level:  Active  Participation Quality:  Appropriate  Affect:  Angry  Cognitive:  Appropriate  Insight: Appropriate  Engagement in Group:  Limited  Modes of Intervention:  Limit-setting  Additional Comments:  Alexis BeersRodney S Klein [name] 04/22/2018, 9:08 AM

## 2018-04-22 NOTE — Progress Notes (Signed)
Discharge note: Patient reviewed discharge paperwork with RN including prescriptions, follow up appointments, and lab work. Patient given the opportunity to ask questions. All concerns were addressed. All belongings were returned to patient. Denied SI/HI/AVH. Patient thanked staff for their care while at the hospital.  Patient was discharged to lobby where her boyfriend was waiting to pick her up. 

## 2018-04-22 NOTE — Plan of Care (Signed)
Spoke with Dr. Jama Flavorsobos about Mrs. Alexis [name] a [age] F here for overdose on hydoxyzine and Valium. An EKG was obtained on 11/26 to evaluate her QTC which showed widespread T wave inversion predominantly in leadsII, III, AVF, V4-V6.  Encouragingly patient has been asymptomatic ( no chest pain, dyspnea or nausea).  Her EKG was repeated today which showed resolution of t wave inversions and a normal EKG.  Given the quick change in t wave inversions in young female with no heart history and asymptomatic this is likely more indicative of leads being placed incorrectly.  From a medical standpoint I do not suspect cardiac disease and medically believe she is safe for discharge and discussed this with Dr. Jama Flavorsobos.   Alexis PeaceShayla D Annora Guderian, MD [facility]

## 2018-04-22 NOTE — Plan of Care (Signed)
  Problem: Education: Goal: Knowledge of the prescribed therapeutic regimen will improve Outcome: Adequate for Discharge   Problem: Activity: Goal: Interest or engagement in leisure activities will improve Outcome: Adequate for Discharge   Problem: Activity: Goal: Imbalance in normal sleep/wake cycle will improve Outcome: Adequate for Discharge   Problem: Coping: Goal: Coping ability will improve Outcome: Adequate for Discharge   

## 2018-04-22 NOTE — Progress Notes (Signed)
  [facility] Discharge [facility] :  Will you be returning to the same living situation after discharge:  Yes,  patient reports she is returning home with her boyfriend At discharge, do you have transportation home?: Yes,  patient reports her boyfriend will pick her up at discharge Do you have the ability to pay for your medications: No.  Release of information consent forms completed and in the chart;  Patient's signature needed at discharge.  Patient to Follow up at: Follow-up Information    Services, [facility]. Go on 05/01/2018.   Specialty:  Behavioral Health Why:  Appointment for therapy services is Friday, 05/01/2018 at 10:00am. Please be sure to bring any discharge paperwork from this hospitalization. It is critical that you establish medication management services during this appointment.  Contact information: [address] [place] [id number] [phone]           Next level of care provider has access to [facility] Link:yes  Safety Planning and Suicide Prevention discussed: Yes,  with the patient   Have you used any form of tobacco in the last 30 days? (Cigarettes, Smokeless Tobacco, Cigars, and/or Pipes): No  Has patient been referred to the Quitline?: N/A patient is not a smoker  Patient has been referred for addiction treatment: N/A  Maeola SarahJolan E Debbora Ang, LCSWA 04/22/2018, 9:20 AM

## 2018-04-22 NOTE — Progress Notes (Signed)
Recreation Therapy Notes  Date: 11.27.19 Time: 930 Location: 300 [address] Dayroom  Group Topic: Stress Management  Goal Area(s) Addresses:  Patient will verbalize importance of using healthy stress management.  Patient will identify positive emotions associated with healthy stress management.   Intervention: Stress Management  Activity :  Meditation.  LRT introduced the stress management technique of meditation.  LRT played a meditation that guided patients to focus on the present and let go of things they can't change.  Patients were to follow along as the meditation played.  Education:  Stress Management, Discharge Planning.   Education Outcome: Acknowledges edcuation/In group clarification offered/Needs additional education  Clinical Observations/Feedback: Pt did not attend group.     Caroll RancherMarjette Irvan Tiedt, LRT/CTRS         Lillia AbedLindsay, Tatiyanna Lashley A 04/22/2018 11:08 AM

## 2018-04-22 NOTE — Discharge Summary (Addendum)
Physician Discharge Summary Note  Patient:  Alexis [name] is an 17 y.o., female  MRN:  [id number]  DOB:  Oct 28, 2000  Patient phone:  [phone] (home)   Patient address:   [address] [place] [address],   Total Time spent with patient: Greater than 30 minutes  Date of Admission:  04/19/2018  Date of Discharge: 04-22-18  Reason for Admission: Intentional drug over.  Principal Problem: MDD (major depressive disorder), recurrent severe, without psychosis (HCC)  Discharge Diagnoses: Principal Problem:   MDD (major depressive disorder), recurrent severe, without psychosis (HCC)  Past Psychiatric History: Major depression  Past Medical History:  Past Medical History:  Diagnosis Date  . Anxiety   . Asthma   . Depression   . GERD (gastroesophageal reflux disease)   . Migraines     Past Surgical History:  Procedure Laterality Date  . CYSTO WITH HYDRODISTENSION N/A 04/10/2018   Procedure: CYSTOSCOPY/HYDRODISTENSION;  Surgeon: Crist Fat, MD;  Location[facility];  Service: Urology;  Laterality: N/A;  . UPPER GI ENDOSCOPY     Family History: History reviewed. No pertinent family history.  Family Psychiatric  History: See H&P  Social History:  Social History   Substance and Sexual Activity  Alcohol Use Never  . Frequency: Never     Social History   Substance and Sexual Activity  Drug Use Never    Social History   Socioeconomic History  . Marital status: Single    Spouse name: Not on file  . Number of children: 0  . Years of education: 7  . Highest education level: Some college, no degree  Occupational History  . Not on file  Social Needs  . Financial resource strain: Patient refused  . Food insecurity:    Worry: Patient refused    Inability: Patient refused  . Transportation needs:    Medical: Patient refused    Non-medical: Patient refused  Tobacco Use  . Smoking status: Never Smoker  . Smokeless  tobacco: Never Used  Substance and Sexual Activity  . Alcohol use: Never    Frequency: Never  . Drug use: Never  . Sexual activity: Yes    Birth control/protection: Pill  Lifestyle  . Physical activity:    Days per week: Patient refused    Minutes per session: Patient refused  . Stress: Patient refused  Relationships  . Social connections:    Talks on phone: Patient refused    Gets together: Patient refused    Attends religious service: Patient refused    Active member of club or organization: Patient refused    Attends meetings of clubs or organizations: Patient refused    Relationship status: Patient refused  Other Topics Concern  . Not on file  Social History Narrative  . Not on file   Hospital Course: (Per Md's admission evaluation): Patient is a 17 year old female with a past psychiatric history significant for probable major depression as well as generalized anxiety disorder who presented to the [facility] emergency department yesterday after an intentional overdose of hydroxyzine and Valium. Emergency department staff was initiating involuntary commitment. The patient was drowsy during the emergency room evaluation so the complete history was unknown. She was accompanied by her father.Patient stated that she had gotten into an argument with her boyfriend. She become frustrated by this. She has a long-standing history of interstitial cystitis. She suffers a great deal of pain. She just was upset and did not impulsive overdose. The  Valium that she took was suppository form which she uses for interstitial cystitis. She currently denies suicidal ideation. The patient admitted that she had some impulsive activity in the past as well as depression and anxiety. She stated her mother has bipolar disorder as well as borderline personality disorder, and she is frightened by the possibility that she might have borderline personality disorder. She stated her father  reminds her constantly that her behavior reminds him of her mother's. This bothers her a great deal. She admitted to having no previous sexual trauma in the past. Her brother did die when she was 17 years old and he was 143 months old. He had sudden infant death syndrome. She has scratched herself in the past, but no burning or cutting behaviors. She denies current suicidality. She was admitted to the hospital for evaluation and stabilization.  Alex[facility] [name] [facility] with complaints of worsening symptoms of depression & intentional drug overdose in a suicide attempt. She was brought to the hospital for evaluation & possible treatment. She cited relationship stressors as the trigger. She was in need of mood stabilization treatments.   During the course of her hospitalization, Alexis [name] was medicated & discharged on; Diazepam 10 mg for anxiety/insomnia, Vistaril 25 mg prn for anxiety & Sertraline 25 mg for depression. She was enrolled & participated in the group counseling sessions being offered & held on this unit. She was counseled & learned coping skills that should help her cope better & maintain mood stability after discharge. She was resumed on all her pertinent home medications for the other previously existing medical issues presented. She tolerated her treatment regimen without any adverse effects reported.   While her treatment was on going, Alexis [name]'s improvement was monitored by observation & her daily reports of symptom reduction noted.  Her emotional & mental status were monitored by daily self-inventory reports completed by her & the clinical staff. She was evaluated daily by the treatment team for mood stability & the need for continued recovery after discharge. She was offered further treatment options upon discharge & will follow up with the outpatient psychiatric services as listed below.     Upon discharge, Alexis [name] was both mentally & medically stable. She is currently denying  suicidal, homicidal ideation, auditory, visual/tactile hallucinations, delusional thoughts & or paranoia. [facility] in no apparent distress.  Physical Findings: AIMS: Facial and Oral Movements Muscles of Facial Expression: None, normal Lips and Perioral Area: None, normal Jaw: None, normal Tongue: None, normal,Extremity Movements Upper (arms, wrists, hands, fingers): None, normal Lower (legs, knees, ankles, toes): None, normal, Trunk Movements Neck, shoulders, hips: None, normal, Overall Severity Severity of abnormal movements (highest score from questions above): None, normal Incapacitation due to abnormal movements: None, normal Patient's awareness of abnormal movements (rate only patient's report): No Awareness, Dental Status Current problems with teeth and/or dentures?: No Does patient usually wear dentures?: No  CIWA:    COWS:     Musculoskeletal: Strength & Muscle Tone: within normal limits Gait & Station: normal Patient leans: N/A  Psychiatric Specialty Exam: Physical Exam  Constitutional: She is oriented to person, place, and time. She appears well-developed.  HENT:  Head: Normocephalic.  Eyes: Pupils are equal, round, and reactive to light.  Neck: Normal range of motion.  Cardiovascular: Normal rate.  Respiratory: Effort normal.  GI: Soft.  Genitourinary:  Genitourinary Comments: Deferred  Musculoskeletal: Normal range of motion.  Neurological: She is alert and oriented to person,  place, and time.  Skin: Skin is warm and dry.    Review of Systems  Constitutional: Negative.   HENT: Negative.   Eyes: Negative.   Respiratory: Negative.  Negative for cough and shortness of breath.   Cardiovascular: Negative.  Negative for chest pain and palpitations.  Gastrointestinal: Negative.  Negative for abdominal pain, heartburn, nausea and vomiting.  Genitourinary: Negative.   Musculoskeletal: Negative.   Skin: Negative.   Neurological:  Negative.  Negative for dizziness.  Endo/Heme/Allergies: Negative.   Psychiatric/Behavioral: Positive for depression (Stable) and substance abuse (Hx. benzodiazepine use). Negative for hallucinations, memory loss and suicidal ideas. The patient has insomnia (Stable). The patient is not nervous/anxious (Stable).     Blood pressure 112/79, pulse (!) 102, temperature 97.6 F (36.4 C), temperature source Oral, resp. rate 14, height 5\' 6"  (1.676 m), weight 49 kg, last menstrual period 04/08/2018.Body mass index is 17.43 kg/m.  See Md's discharge SRA   Have you used any form of tobacco in the last 30 days? (Cigarettes, Smokeless Tobacco, Cigars, and/or Pipes): No  Has this patient used any form of tobacco in the last 30 days? (Cigarettes, Smokeless Tobacco, Cigars, and/or Pipes): N/A  Blood Alcohol level:  Lab Results  Component Value Date   ETH <10 04/19/2018    Metabolic Disorder Labs:  No results found for: HGBA1C, MPG No results found for: PROLACTIN No results found for: CHOL, TRIG, HDL, CHOLHDL, VLDL, LDLCALC  See Psychiatric Specialty Exam and Suicide Risk Assessment completed by Attending Physician prior to discharge.  Discharge destination:  Home  Is patient on multiple antipsychotic therapies at discharge:  No   Has Patient had three or more failed trials of antipsychotic monotherapy by history:  No  Recommended Plan for Multiple Antipsychotic Therapies: NA  Allergies as of 04/22/2018      Reactions   Other Other (See Comments)   All acidic foods cause extreme bladder pain      Medication List    TAKE these medications     Indication  albuterol 108 (90 Base) MCG/ACT inhaler Commonly known as:  PROVENTIL HFA;VENTOLIN HFA Inhale 1-2 puffs into the lungs every 6 (six) hours as needed for wheezing or shortness of breath. What changed:  how much to take  Indication:  Asthma   diazepam 10 MG tablet Commonly known as:  VALIUM Take 1 tablet (10 mg total) by mouth at  bedtime. For severe anxiety What changed:    when to take this  reasons to take this  additional instructions  Indication:  Feeling Anxious   famotidine 20 MG tablet Commonly known as:  PEPCID Take 1 tablet (20 mg total) by mouth daily. For acid reflux Start taking on:  04/23/2018  Indication:  Gastroesophageal Reflux Disease   hydrOXYzine 25 MG tablet Commonly known as:  ATARAX/VISTARIL Take 1 tablet (25 mg total) by mouth every 8 (eight) hours as needed for anxiety. What changed:    medication strength  how much to take  when to take this  reasons to take this  Indication:  Feeling Anxious   meloxicam 15 MG tablet Commonly known as:  MOBIC Take 1 tablet (15 mg total) by mouth daily. For pain What changed:  additional instructions  Indication:  pain   Norgestimate-Ethinyl Estradiol Triphasic 0.18/0.215/0.25 MG-25 MCG tab Take 1 tablet by mouth at bedtime. Birth control method What changed:  additional instructions  Indication:  Birth Control Treatment   phenazopyridine 100 MG tablet Commonly known as:  PYRIDIUM Take 1 tablet (  100 mg total) by mouth 3 (three) times daily. For bladder spasms What changed:  additional instructions  Indication:  Bladder Lining Irritation   phenazopyridine 200 MG tablet Commonly known as:  PYRIDIUM Take 1 tablet (200 mg total) by mouth daily as needed (urinary symptoms). What changed:    when to take this  reasons to take this  Indication:  Bladder Lining Irritation   sertraline 25 MG tablet Commonly known as:  ZOLOFT Take 1 tablet (25 mg total) by mouth daily. For depression Start taking on:  04/23/2018  Indication:  Major Depressive Disorder   traMADol 50 MG tablet Commonly known as:  ULTRAM Take 1 tablet (50 mg total) by mouth every 8 (eight) hours as needed for moderate pain. What changed:    how much to take  when to take this  Indication:  Pain      Follow-up Information    Services, [facility]. Go on  05/01/2018.   Specialty:  Behavioral Health Why:  Appointment for therapy services is Friday, 05/01/2018 at 10:00am. Please be sure to bring any discharge paperwork from this hospitalization. It is critical that you establish medication management services during this appointment.  Contact information: [address] [place] [address] [phone]          Follow-up recommendations: Activity:  As tolerated Diet: As recommended by your primary care doctor. Keep all scheduled follow-up appointments as recommended.  Comments: Patient is instructed prior to discharge to: Take all medications as prescribed by his/her mental healthcare provider. Report any adverse effects and or reactions from the medicines to his/her outpatient provider promptly. Patient has been instructed & cautioned: To not engage in alcohol and or illegal drug use while on prescription medicines. In the event of worsening symptoms, patient is instructed to call the crisis hotline, 911 and or go to the nearest ED for appropriate evaluation and treatment of symptoms. To follow-up with his/her primary care provider for your other medical issues, concerns and or health care needs.   Signed: Armandina Stammer, NP, PMHNP, FNP-BC 04/22/2018, 10:34 AM   Patient seen, Suicide Assessment Completed.  Disposition Plan Reviewed

## 2018-04-22 NOTE — [facility] Suicide Risk Assessment (Signed)
[facility] Discharge Suicide Risk Assessment   Principal Problem: Depression Discharge Diagnoses: Active Problems:   MDD (major depressive disorder), recurrent severe, without psychosis (HCC)   Total Time spent with patient: 30 minutes  Musculoskeletal: Strength & Muscle Tone: within normal limits Gait & Station: normal Patient leans: N/A  Psychiatric Specialty Exam: ROS no headache, no chest pain, no shortness of breath, no vomiting, no nausea, (+) chronic pelvic/GU   Blood pressure 112/79, pulse (!) 102, temperature 97.6 F (36.4 C), temperature source Oral, resp. rate 14, height 5\' 6"  (1.676 m), weight 49 kg, last menstrual period 04/08/2018.Body mass index is 17.43 kg/m.  General Appearance: Well Groomed  Eye Contact::  Good  Speech:  Normal Rate409  Volume:  Normal  Mood:  reports her mood is " better ", states " I am feeling better than I have felt in months"  Affect:  Appropriate and Full Range  Thought Process:  Linear and Descriptions of Associations: Intact  Orientation:  Full (Time, Place, and Person)  Thought Content:  no hallucinations, no delusions, not internally preoccupied   Suicidal Thoughts:  No denies suicidal or self injurious ideations, denies homicidal or violent ideations   Homicidal Thoughts:  No  Memory:  recent and remote grossly intact   Judgement:  Improving   Insight:  Fair- improving   Psychomotor Activity:  Normal  Concentration:  Good  Recall:  Good  Fund of Knowledge:Good  Language: Good  Akathisia:  Negative  Handed:  Right  AIMS (if indicated):     Assets:  Communication Skills Desire for Improvement Resilience  Sleep:  Number of Hours: 5.5  Cognition: WNL  ADL's:  Intact   Mental Status Per Nursing Assessment::   On Admission:  Suicidal ideation indicated by patient  Demographic Factors:  17 , single , no children, lives with boyfriend, is in college   Loss Factors: Recent argument with BF, recent argument with her grandfather,  chronic pain related to Interstitial Cystitis   Historical Factors: History of depression and anxiety. No prior psychiatric admissions . No history of prior suicidal attempts  Risk Reduction Factors:   Sense of responsibility to family, Living with another person, especially a relative, Positive social support, Positive coping skills or problem solving skills and in college  Continued Clinical Symptoms:  Alert, attentive,well related, presents euthymic, with full range of affect, no thought disorder, no suicidal or self injurious ideations, no homicidal or violent ideations, no hallucinations, no delusions, not internally preoccupied, future oriented . Behavior on unit in good control, interacting appropriately with peers, pleasant on approach. Denies medication side effects. We reviewed Zoloft related side effects, including potential risk of increased suicidal ideations early in treatment with antidepressants in young adults . Patient reports she has had good visits with her BF, and that their relationship is now improved/stronger.  EKG repeated this AM and reviewed with hospitalist. Patient is asymptomatic- no chest pain, no shortness of breath. Denies history of cardiac illness .  (11/26 EKG suspected to have been related to lead inversion).   Cognitive Features That Contribute To Risk:  No gross cognitive deficits noted upon discharge. Is alert , attentive, and oriented x 3   Suicide Risk:  Mild:  Suicidal ideation of limited frequency, intensity, duration, and specificity.  There are no identifiable plans, no associated intent, mild dysphoria and related symptoms, good self-control (both objective and subjective assessment), few other risk factors, and identifiable protective factors, including available and accessible social support.    Plan Of  Care/Follow-up recommendations:  Activity:  as tolerated Diet:  Regular Tests:  NA Other:  See below  Patient is leaving unit in good  spirits , plans to return home Plans to follow with her therapist - Bonita Quin in [facility]  Patient has an established PCP and Urologist for medical management, management of IC.   Craige Cotta, MD 04/22/2018, 9:01 AM

## 2018-05-06 ENCOUNTER — Encounter: Payer: Medicaid Other | Admitting: Physical Therapy

## 2018-05-06 ENCOUNTER — Ambulatory Visit: Payer: Medicaid Other | Admitting: Physical Therapy

## 2018-05-08 ENCOUNTER — Encounter: Payer: Self-pay | Admitting: Physical Therapy

## 2018-05-08 ENCOUNTER — Ambulatory Visit: Payer: Medicaid Other | Attending: Urology | Admitting: Physical Therapy

## 2018-05-08 DIAGNOSIS — R293 Abnormal posture: Secondary | ICD-10-CM | POA: Insufficient documentation

## 2018-05-08 DIAGNOSIS — M6281 Muscle weakness (generalized): Secondary | ICD-10-CM | POA: Insufficient documentation

## 2018-05-08 DIAGNOSIS — R252 Cramp and spasm: Secondary | ICD-10-CM | POA: Insufficient documentation

## 2018-05-08 NOTE — Therapy (Signed)
[facility] [address] [place], [place], [address] Phone: [phone]   Fax:  [phone]  Physical Therapy Treatment  Patient Details  Name: Alexis [name] MRN: [id number] Date of Birth: 2000/07/05 Referring Provider (PT): Burman Foster NP   Encounter Date: 05/08/2018  PT End of Session - 05/08/18 1059    Visit Number  12    Date for PT Re-Evaluation  07/31/18    Authorization Type  Medicaid    Authorization Time Period  02/16/18-05/10/18 for 12 visits    Authorization - Visit Number  5    Authorization - Number of Visits  12    PT Start Time  1015    PT Stop Time  1115    PT Time Calculation (min)  60 min    Activity Tolerance  Patient tolerated treatment well;No increased pain    Behavior During Therapy  WFL for tasks assessed/performed       Past Medical History:  Diagnosis Date  . Anxiety   . Asthma   . Depression   . GERD (gastroesophageal reflux disease)   . Migraines     Past Surgical History:  Procedure Laterality Date  . CYSTO WITH HYDRODISTENSION N/A 04/10/2018   Procedure: CYSTOSCOPY/HYDRODISTENSION;  Surgeon: Crist Fat, MD;  Location: [facility];  Service: Urology;  Laterality: N/A;  . UPPER GI ENDOSCOPY      There were no vitals filed for this visit.  Subjective Assessment - 05/08/18 1022    Subjective  I am on my period. I still have a cold but no fever now. The pain killers messed up my bowels and now I am regular and no pain. Pain with urination.     Patient Stated Goals  reduce pain    Currently in Pain?  Yes    Pain Score  8     Pain Location  Abdomen    Pain Orientation  Right;Left;Lower    Pain Descriptors / Indicators  Stabbing;Burning;Aching    Pain Type  Chronic pain    Pain Onset  More than a month ago    Pain Frequency  Constant    Aggravating Factors   urination, bowel movements, intercourse    Pain Relieving Factors  heat, moving around     Multiple Pain Sites  No         [facility] PT Assessment - 05/08/18 0001      Assessment   Medical Diagnosis  N30.20 Chronic cystitis    Referring Provider (PT)  Wallace Cullens Ronald Pippins NP    Onset Date/Surgical Date  01/25/17    Prior Therapy  none      Precautions   Precautions  None      Restrictions   Weight Bearing Restrictions  No      Home Environment   Living Environment  Private residence      Prior Function   Level of Independence  Independent    Vocation  Student    Leisure  none      Cognition   Overall Cognitive Status  Within Functional Limits for tasks assessed      Posture/Postural Control   Posture/Postural Control  Postural limitations    Postural Limitations  Rounded Shoulders;Forward head;Decreased thoracic kyphosis;Posterior pelvic tilt;Flexed trunk      Strength   Right Hip Flexion  5/5    Right Hip External Rotation   4+/5    Right Hip Internal Rotation  5/5  Right Hip ABduction  5/5    Left Hip Flexion  5/5    Left Hip External Rotation  5/5    Left Hip Internal Rotation  5/5    Left Hip ABduction  4/5      Palpation   SI assessment   left ilium rotated posteriorly                Pelvic Floor Special Questions - 05/08/18 0001    Strength  good squeeze, good lift, able to hold agaisnt strong resistance        [facility] Adult PT Treatment/Exercise - 05/08/18 0001      Modalities   Modalities  Electrical Stimulation;Moist Heat      Moist Heat Therapy   Number Minutes Moist Heat  15 Minutes    Moist Heat Location  Lumbar Spine   abdominal     Electrical Stimulation   Electrical Stimulation Location  sacral and lower abdominal    Electrical Stimulation Action  IFC    Electrical Stimulation Parameters  to patient tolerance, 15 min    Electrical Stimulation Goals  Pain      Manual Therapy   Manual Therapy  Soft tissue mobilization;Myofascial release    Manual therapy comments  used assistive device to work on the assistive device     Soft tissue mobilization  lumbar paraspinals and left obturator internist and levator ani with trunk on ball and hips IR    Myofascial Release  release of the abdominal tissue in quadruped starting from spine to anterior abdomen in quadruped    Internal Pelvic Floor  deferred due to period       Trigger Point Dry Needling - 05/08/18 1102    Consent Given?  Yes    Education Handout Provided  Yes    Muscles Treated Upper Body  Quadratus Lumborum   L1-L5 multifidi bil.    Muscles Treated Lower Body  --   elongation of tissue and trigger point release          PT Education - 05/08/18 1059    Education Details  dry needle information    Person(s) Educated  Patient    Methods  Explanation;Handout    Comprehension  Verbalized understanding       PT Short Term Goals - 01/08/18 1024      PT SHORT TERM GOAL #1   Title  independent with initial HEP    Time  4    Period  Weeks    Status  Achieved      PT SHORT TERM GOAL #2   Title  understand how to toilet correctly to reduce strain on the pelvic floor    Time  4    Period  Weeks    Status  Achieved        PT Long Term Goals - 05/08/18 1201      PT LONG TERM GOAL #1   Title  independent with HEP    Baseline  still learning    Time  8    Period  Weeks    Status  On-going      PT LONG TERM GOAL #2   Title  reduce pain with vaginal penetration with exam and penile </= 3/10    Baseline  pain level 8/10     Time  8    Period  Weeks    Status  On-going      PT LONG TERM GOAL #3   Title  burining  with urination decreased >/= 75% due to ability to relax the pelvic floor    Baseline  burning pain 8/10;     Time  8    Period  Weeks    Status  On-going      PT LONG TERM GOAL #4   Title  reduce straining with bowel movement >/= 75% due to relaxation of pelvic floor muscles    Time  8    Period  Weeks    Status  Achieved      PT LONG TERM GOAL #5   Title  sitting for 30 minutes wiith pain level </3-4/10 due to  decreased muscle tension     Baseline  pain 2/10    Time  12    Period  Weeks    Status  Achieved            Plan - 05/08/18 1100    Clinical Impression Statement  Patient had hydrostillation of the bladder several weeks ago and pain level has been 8/10. Patient has 8/10 pain with vaginal exam and penetration. Patient able to sit for 30 minutes with pain level 2/10. Patient still needs verbal cues to sit upright instead of a flexed posture. Patient pelvic floor strength is 4/5 with trigger points of the pelvic floor muscles. Patient has weakness in right hip external rotators and left hip abduction. Patient is not straining with bowel movements and is regular. Patient will benefit from skilled therapy to work on decreased spasms of the pelvic floor to reduce pain and burning with urination.     Rehab Potential  Excellent    Clinical Impairments Affecting Rehab Potential  none    PT Frequency  1x / week    PT Duration  Other (comment)   4 months   PT Treatment/Interventions  Biofeedback;Cryotherapy;Electrical Stimulation;Moist Heat;Ultrasound;Therapeutic exercise;Therapeutic activities;Neuromuscular re-education;Patient/family education;Manual techniques;Dry needling    PT Next Visit Plan  internal soft tissue work internally and around vaginally; dry needle lumbar spine    PT Home Exercise Plan  ACCESS CODE: [facility]    Consulted and Agree with Plan of Care  Patient       Patient will benefit from skilled therapeutic intervention in order to improve the following deficits and impairments:  Increased fascial restricitons, Pain, Decreased mobility, Increased muscle spasms, Postural dysfunction, Decreased activity tolerance, Decreased range of motion, Decreased strength  Visit Diagnosis: Muscle weakness (generalized)  Abnormal posture  Cramp and spasm     Problem List Patient Active Problem List   Diagnosis Date Noted  . MDD (major depressive disorder), recurrent severe,  without psychosis (HCC) 04/19/2018  . Suicide attempt by benzodiazepine overdose (HCC) 04/19/2018    Eulis Foster, PT 05/08/18 12:03 PM   [facility] [address] [place], [place], [address] Phone: [phone]   Fax:  [phone]  Name: Alexis [name] MRN: [id number] Date of Birth: [birth year]

## 2018-05-08 NOTE — Patient Instructions (Signed)

## 2018-05-13 ENCOUNTER — Ambulatory Visit: Payer: Medicaid Other | Admitting: Certified Nurse Midwife

## 2018-06-01 ENCOUNTER — Encounter: Payer: Self-pay | Admitting: Physical Therapy

## 2018-06-01 ENCOUNTER — Ambulatory Visit: Payer: Medicaid Other | Attending: Urology | Admitting: Physical Therapy

## 2018-06-01 DIAGNOSIS — M6281 Muscle weakness (generalized): Secondary | ICD-10-CM | POA: Diagnosis not present

## 2018-06-01 DIAGNOSIS — R252 Cramp and spasm: Secondary | ICD-10-CM | POA: Diagnosis present

## 2018-06-01 DIAGNOSIS — R293 Abnormal posture: Secondary | ICD-10-CM

## 2018-06-01 NOTE — Therapy (Signed)
[facility] [address] [place], [place], [address] Phone: [phone]   Fax:  [phone]  Physical Therapy Treatment  Patient Details  Name: Alexis [name] MRN: [id number] Date of Birth: August 04, 2000 Referring Provider (PT): Burman Foster NP   Encounter Date: 06/01/2018  PT End of Session - 06/01/18 1448    Visit Number  13    Date for PT Re-Evaluation  07/31/18    Authorization Type  Medicaid    Authorization Time Period  05/12/2018 to 08/03/2018 for 12 visits    Authorization - Visit Number  1    Authorization - Number of Visits  12    PT Start Time  1445    PT Stop Time  1525    PT Time Calculation (min)  40 min    Activity Tolerance  Patient tolerated treatment well;No increased pain    Behavior During Therapy  WFL for tasks assessed/performed       Past Medical History:  Diagnosis Date  . Anxiety   . Asthma   . Depression   . GERD (gastroesophageal reflux disease)   . Migraines     Past Surgical History:  Procedure Laterality Date  . CYSTO WITH HYDRODISTENSION N/A 04/10/2018   Procedure: CYSTOSCOPY/HYDRODISTENSION;  Surgeon: Crist Fat, MD;  Location: [facility];  Service: Urology;  Laterality: N/A;  . UPPER GI ENDOSCOPY      There were no vitals filed for this visit.  Subjective Assessment - 06/01/18 1450    Subjective  I am doing better. Bowels are doing well. Patient has to strain to urinate.     Patient Stated Goals  reduce pain    Currently in Pain?  Yes    Pain Score  4    after urination 7/10   Pain Location  Vagina    Pain Orientation  Mid    Pain Descriptors / Indicators  Burning;Aching    Pain Type  Chronic pain    Pain Onset  More than a month ago    Pain Frequency  Constant    Aggravating Factors   urination, intercourse    Pain Relieving Factors  heat, moving around    Multiple Pain Sites  No                    Pelvic Floor Special  Questions - 06/01/18 0001    Pelvic Floor Internal Exam  Patient confirmed identication and approves PT to assess muscle and treatment    Exam Type  Vaginal        [facility] Adult PT Treatment/Exercise - 06/01/18 0001      Manual Therapy   Manual Therapy  Myofascial release;Internal Pelvic Floor    Soft tissue mobilization  release of the obturator, release of the levator ani externally, release of the bladder with one hand on the suprapubic area.     Internal Pelvic Floor  release around the urethra, release on the sphincter with outside hand working on the tissue on bilateral sides; release of the bladder internally and other hand on the outside of the bladder.                PT Short Term Goals - 01/08/18 1024      PT SHORT TERM GOAL #1   Title  independent with initial HEP    Time  4    Period  Weeks    Status  Achieved  PT SHORT TERM GOAL #2   Title  understand how to toilet correctly to reduce strain on the pelvic floor    Time  4    Period  Weeks    Status  Achieved        PT Long Term Goals - 06/01/18 1532      PT LONG TERM GOAL #1   Title  independent with HEP    Baseline  still learning    Time  8    Period  Weeks    Status  On-going      PT LONG TERM GOAL #2   Title  reduce pain with vaginal penetration with exam and penile </= 3/10    Baseline  pain level 8/10     Time  8    Period  Weeks    Status  On-going      PT LONG TERM GOAL #3   Title  burining with urination decreased >/= 75% due to ability to relax the pelvic floor    Baseline  burning pain 8/10;     Time  8    Period  Weeks    Status  On-going      PT LONG TERM GOAL #4   Title  reduce straining with bowel movement >/= 75% due to relaxation of pelvic floor muscles    Period  Weeks    Status  Achieved            Plan - 06/01/18 1529    Clinical Impression Statement  Patient has to strain to urinate due to pelvic floor tightness. Patient pain decreased to 2/10 after soft  tissue work. Initially needed to perform releasing outside the perineum due to initial pain 7/10. Patient has increased mobility of the urethra after soft tissue work. Patient feels better since last visit when she was sick. Patient will benefit from skilled therapy to work on decreased spasms of the pelvic floor to reduce pain and burning with urination.     Rehab Potential  Excellent    Clinical Impairments Affecting Rehab Potential  none    PT Frequency  1x / week    PT Duration  Other (comment)   4 months   PT Treatment/Interventions  Biofeedback;Cryotherapy;Electrical Stimulation;Moist Heat;Ultrasound;Therapeutic exercise;Therapeutic activities;Neuromuscular re-education;Patient/family education;Manual techniques;Dry needling    PT Next Visit Plan  internal soft tissue work internally and around vaginally    PT Home Exercise Plan  ACCESS CODE: [facility]    Consulted and Agree with Plan of Care  Patient       Patient will benefit from skilled therapeutic intervention in order to improve the following deficits and impairments:  Increased fascial restricitons, Pain, Decreased mobility, Increased muscle spasms, Postural dysfunction, Decreased activity tolerance, Decreased range of motion, Decreased strength  Visit Diagnosis: Muscle weakness (generalized)  Abnormal posture  Cramp and spasm     Problem List Patient Active Problem List   Diagnosis Date Noted  . MDD (major depressive disorder), recurrent severe, without psychosis (HCC) 04/19/2018  . Suicide attempt by benzodiazepine overdose (HCC) 04/19/2018    Eulis Foster, PT 06/01/18 3:33 PM   [facility] [address] [place], [place], [address] Phone: [phone]   Fax:  [phone]  Name: Alexis [name] MRN: [id number] Date of Birth: Nov 06, 2000

## 2018-06-11 ENCOUNTER — Ambulatory Visit: Payer: Medicaid Other | Admitting: Physical Therapy

## 2018-06-11 ENCOUNTER — Encounter: Payer: Self-pay | Admitting: Physical Therapy

## 2018-06-11 DIAGNOSIS — R293 Abnormal posture: Secondary | ICD-10-CM

## 2018-06-11 DIAGNOSIS — M6281 Muscle weakness (generalized): Secondary | ICD-10-CM | POA: Diagnosis not present

## 2018-06-11 DIAGNOSIS — R252 Cramp and spasm: Secondary | ICD-10-CM

## 2018-06-11 NOTE — Patient Instructions (Signed)
Access Code: [facility]  URL: [email or web address]  Date: 06/11/2018  Prepared by: Eulis Foster   Exercises  Seated Hamstring Stretch - 2 reps - 1 sets - 30 sec hold - 1x daily - 7x weekly  Seated Piriformis Stretch with Trunk Bend - 2 reps - 1 sets - 30 sec hold - 1x daily - 7x weekly  Cat Cow - 10 reps - 1 sets - 1x daily - 7x weekly  Child's Pose Stretch - 1 reps - 1 sets - 30 sec hold - 1x daily - 7x weekly  Child's Pose with Sidebending - 1 reps - 1 sets - 30 sec hold - 1x daily - 7x weekly  Prone Press Up - 2 reps - 1 sets - 10 sec hold - 1x daily - 7x weekly  V Sit Hip Adductor Hamstring Stretch - 2 reps - 1 sets - 30 sec hold - 1x daily - 7x weekly  Supine Double Knee to Chest - 1 reps - 1 sets - 30 sec hold - 1x daily - 7x weekly  Supine Lower Trunk Rotation - 2 reps - 1 sets - 30 sec hold - 1x daily - 7x weekly  Supine Pelvic Floor Stretch - 1 reps - 1 sets - 1 min hold - 1x daily - 7x weekly  Supine Diaphragmatic Breathing with Pelvic Floor Lengthening - 10 reps - 1 sets - 2x daily - 7x weekly  Seated Diaphragmatic Breathing - 10 reps - 1 sets - 1x daily - 7x weekly  Supine Shoulder Horizontal Abduction with Resistance - 10 reps - 1 sets - 1x daily - 7x weekly  Sidelying Hip Abduction with Ankle Weight - 10 reps - 1 sets - 1x daily - 7x weekly  Double Leg Hamstring Stretch at Wall - 1 reps - 1 sets - hold - 1x daily - 7x weekly  Supine Runner's Stretch at Wall - 1 reps - 1 sets - 1 min hold - 1x daily - 7x weekly  Quadruped Pelvic Floor Contraction with Opposite Arm and Leg Lift - 10 reps - 1 sets - 1x daily - 7x weekly  Supine Shoulder Horizontal Abduction with Resistance - 10 reps - 2 sets - 1x daily - 7x weekly  Standing Shoulder Diagonal Horizontal Abduction 60/120 Degrees with Resistance - 10 reps - 1 sets - 1x daily - 7x weekly  Dead Bug - 10 reps - 1 sets - 1x daily - 7x weekly  [facility] [address] [place], [place]  [address] Phone # [phone] Fax [phone]

## 2018-06-11 NOTE — Therapy (Signed)
[facility] [address] [place], [place], [address] Phone: [phone]   Fax:  [phone]  Physical Therapy Treatment  Patient Details  Name: Alexis GeneCasey M Simons MRN: [id number] Date of Birth: 01/30/2001 Referring Provider (PT): Burman FosterBree Tharpe Fleck NP   Encounter Date: 06/11/2018  PT End of Session - 06/11/18 1607    Visit Number  14    Date for PT Re-Evaluation  07/31/18    Authorization Type  Medicaid    Authorization Time Period  05/12/2018 to 08/03/2018 for 12 visits    Authorization - Visit Number  2    Authorization - Number of Visits  12    PT Start Time  1530    PT Stop Time  1608    PT Time Calculation (min)  38 min    Activity Tolerance  Patient tolerated treatment well;No increased pain    Behavior During Therapy  WFL for tasks assessed/performed       Past Medical History:  Diagnosis Date  . Anxiety   . Asthma   . Depression   . GERD (gastroesophageal reflux disease)   . Migraines     Past Surgical History:  Procedure Laterality Date  . CYSTO WITH HYDRODISTENSION N/A 04/10/2018   Procedure: CYSTOSCOPY/HYDRODISTENSION;  Surgeon: Crist FatHerrick, Benjamin W, MD;  [facility];  Service: Urology;  Laterality: N/A;  . UPPER GI ENDOSCOPY      There were no vitals filed for this visit.  Subjective Assessment - 06/11/18 1531    Subjective  I still have to strain to urinate. I am no my cycle. Pain is better than it used to be.     Patient Stated Goals  reduce pain    Currently in Pain?  Yes    Pain Score  5     Pain Location  Vagina    Pain Orientation  Mid    Pain Descriptors / Indicators  Aching;Burning    Pain Type  Chronic pain    Pain Onset  More than a month ago    Pain Frequency  Constant    Aggravating Factors   urination, intercourse    Pain Relieving Factors  heat, moving around    Multiple Pain Sites  No                       [facility] Adult PT  Treatment/Exercise - 06/11/18 0001      Lumbar Exercises: Stretches   Active Hamstring Stretch  Right;Left;1 rep;30 seconds    Quadruped Mid Back Stretch  3 reps;30 seconds   all three directions   Other Lumbar Stretch Exercise  wall stretches for hamstring, hip adductors, and pirrformis      Lumbar Exercises: Prone   Opposite Arm/Leg Raise  10 reps;Right arm/Left leg;Left arm/Right leg    Opposite Arm/Leg Raise Limitations  VC on abdominal bracing      Lumbar Exercises: Quadruped   Madcat/Old Horse  10 reps             PT Education - 06/11/18 1603    Education Details  [email or web address]     Person(s) Educated  Patient    Methods  Explanation;Demonstration;Verbal cues;Handout    Comprehension  Returned demonstration;Verbalized understanding       PT Short Term Goals - 01/08/18 1024      PT SHORT TERM GOAL #1   Title  independent with initial HEP    Time  4  Period  Weeks    Status  Achieved      PT SHORT TERM GOAL #2   Title  understand how to toilet correctly to reduce strain on the pelvic floor    Time  4    Period  Weeks    Status  Achieved        PT Long Term Goals - 06/11/18 1533      PT LONG TERM GOAL #2   Title  reduce pain with vaginal penetration with exam and penile </= 3/10    Baseline  pain level 6/10     Time  8    Period  Weeks    Status  On-going      PT LONG TERM GOAL #3   Title  burining with urination decreased >/= 75% due to ability to relax the pelvic floor    Baseline  30% better, pain level is 5/10    Time  8    Period  Weeks    Status  On-going      PT LONG TERM GOAL #4   Title  reduce straining with bowel movement >/= 75% due to relaxation of pelvic floor muscles    Time  8    Period  Weeks    Status  Achieved      PT LONG TERM GOAL #5   Title  sitting for 30 minutes wiith pain level </3-4/10 due to decreased muscle tension     Time  12    Period  Weeks    Status  Achieved            Plan - 06/11/18 1535     Clinical Impression Statement  After exercises and stretching patient pain decreased to 3/10 from 5/10. Patient exercises are to open up the pelvic floor muscles with stretches, core stability and back strength to open up the abdominal area.  Patient was on her cycle so internal work was not done. Patient continues to have  an increased thoracic kyphosis due to poor sitting posture. Patient will benefit from skilled therapy to work on decreased spasms of the pelvic floor to reduce pain and burning with urination.     Clinical Impairments Affecting Rehab Potential  none    PT Frequency  1x / week    PT Duration  Other (comment)   4 months   PT Treatment/Interventions  Biofeedback;Cryotherapy;Electrical Stimulation;Moist Heat;Ultrasound;Therapeutic exercise;Therapeutic activities;Neuromuscular re-education;Patient/family education;Manual techniques;Dry needling    PT Next Visit Plan  internal soft tissue work internally and around vaginally    PT Home Exercise Plan  ACCESS [email or web address]    Consulted and Agree with Plan of Care  Patient       Patient will benefit from skilled therapeutic intervention in order to improve the following deficits and impairments:  Increased fascial restricitons, Pain, Decreased mobility, Increased muscle spasms, Postural dysfunction, Decreased activity tolerance, Decreased range of motion, Decreased strength  Visit Diagnosis: Muscle weakness (generalized)  Abnormal posture  Cramp and spasm     Problem List Patient Active Problem List   Diagnosis Date Noted  . MDD (major depressive disorder), recurrent severe, without psychosis (HCC) 04/19/2018  . Suicide attempt by benzodiazepine overdose (HCC) 04/19/2018    Eulis Fosterheryl Dawon Troop, PT 06/11/18 4:15 PM    [facility] [address], [address] [place], [place], [address] Phone: [phone]   Fax:  [phone]  Name: Alexis GeneCasey M Wemhoff MRN: [id number] Date of  Birth: 09/07/2000

## 2018-06-18 ENCOUNTER — Ambulatory Visit: Payer: Medicaid Other | Admitting: Physical Therapy

## 2018-06-18 ENCOUNTER — Encounter: Payer: Self-pay | Admitting: Physical Therapy

## 2018-06-18 DIAGNOSIS — M6281 Muscle weakness (generalized): Secondary | ICD-10-CM

## 2018-06-18 DIAGNOSIS — R293 Abnormal posture: Secondary | ICD-10-CM

## 2018-06-18 DIAGNOSIS — R252 Cramp and spasm: Secondary | ICD-10-CM

## 2018-06-18 NOTE — Therapy (Signed)
[facility] [address] [place], [place], [address] Phone: [phone]   Fax:  [phone]  Physical Therapy Treatment  Patient Details  Name: Alexis [name] MRN: [id number] Date of Birth: 30-Jan-2001 Referring Provider (PT): Burman Foster NP   Encounter Date: 06/18/2018  PT End of Session - 06/18/18 1534    Visit Number  15    Date for PT Re-Evaluation  07/31/18    Authorization Type  Medicaid    Authorization Time Period  05/12/2018 to 08/03/2018 for 12 visits    Authorization - Visit Number  3    Authorization - Number of Visits  12    PT Start Time  1530    PT Stop Time  1610    PT Time Calculation (min)  40 min    Activity Tolerance  Patient tolerated treatment well;No increased pain    Behavior During Therapy  WFL for tasks assessed/performed       Past Medical History:  Diagnosis Date  . Anxiety   . Asthma   . Depression   . GERD (gastroesophageal reflux disease)   . Migraines     Past Surgical History:  Procedure Laterality Date  . CYSTO WITH HYDRODISTENSION N/A 04/10/2018   Procedure: CYSTOSCOPY/HYDRODISTENSION;  Surgeon: Crist Fat, MD;  Location: [facility];  Service: Urology;  Laterality: N/A;  . UPPER GI ENDOSCOPY      There were no vitals filed for this visit.  Subjective Assessment - 06/18/18 1532    Subjective  I had little flares that are not too bad.     Patient Stated Goals  reduce pain    Currently in Pain?  Yes    Pain Score  1     Pain Location  Abdomen    Pain Orientation  Mid;Lower    Pain Descriptors / Indicators  Aching    Pain Type  Chronic pain    Pain Onset  More than a month ago    Pain Frequency  Constant    Aggravating Factors   urination, intercourse    Pain Relieving Factors  heat and moving around    Multiple Pain Sites  No                    Pelvic Floor Special Questions - 06/18/18 0001    Pelvic Floor Internal  Exam  Patient confirmed identication and approves PT to assess muscle and treatment    Exam Type  Vaginal        [facility] Adult PT Treatment/Exercise - 06/18/18 0001      Manual Therapy   Manual Therapy  Myofascial release;Internal Pelvic Floor    Myofascial Release  release of bilateral inner thigh, and quadricep    Internal Pelvic Floor  release around the bladder, urethra sphincter, levator ani, perineal body , and obturator internist   used desert harvest revelum with work               PT Short Term Goals - 01/08/18 1024      PT SHORT TERM GOAL #1   Title  independent with initial HEP    Time  4    Period  Weeks    Status  Achieved      PT SHORT TERM GOAL #2   Title  understand how to toilet correctly to reduce strain on the pelvic floor    Time  4    Period  Weeks  Status  Achieved        PT Long Term Goals - 06/11/18 1533      PT LONG TERM GOAL #2   Title  reduce pain with vaginal penetration with exam and penile </= 3/10    Baseline  pain level 6/10     Time  8    Period  Weeks    Status  On-going      PT LONG TERM GOAL #3   Title  burining with urination decreased >/= 75% due to ability to relax the pelvic floor    Baseline  30% better, pain level is 5/10    Time  8    Period  Weeks    Status  On-going      PT LONG TERM GOAL #4   Title  reduce straining with bowel movement >/= 75% due to relaxation of pelvic floor muscles    Time  8    Period  Weeks    Status  Achieved      PT LONG TERM GOAL #5   Title  sitting for 30 minutes wiith pain level </3-4/10 due to decreased muscle tension     Time  12    Period  Weeks    Status  Achieved            Plan - 06/18/18 1535    Clinical Impression Statement  Patient pain today was 1/10 which is an improvement from 5/10. Patient had fascial releases throughout the pelvic floor with internal work. Patient continues to have burning with urination. Patient tolerated the internal soft tissue work  well. Patient will benefit from skilled therapy to work on decreased spasms of the pelvic floor to reduce pain and burning with urination.     Rehab Potential  Excellent    Clinical Impairments Affecting Rehab Potential  none    PT Frequency  1x / week    PT Duration  Other (comment)   4 months   PT Treatment/Interventions  Biofeedback;Cryotherapy;Electrical Stimulation;Moist Heat;Ultrasound;Therapeutic exercise;Therapeutic activities;Neuromuscular re-education;Patient/family education;Manual techniques;Dry needling    PT Next Visit Plan  internal soft tissue work internally and around vaginally    PT Home Exercise Plan  ACCESS CODE: [facility]    Consulted and Agree with Plan of Care  Patient       Patient will benefit from skilled therapeutic intervention in order to improve the following deficits and impairments:  Increased fascial restricitons, Pain, Decreased mobility, Increased muscle spasms, Postural dysfunction, Decreased activity tolerance, Decreased range of motion, Decreased strength  Visit Diagnosis: Muscle weakness (generalized)  Abnormal posture  Cramp and spasm     Problem List Patient Active Problem List   Diagnosis Date Noted  . MDD (major depressive disorder), recurrent severe, without psychosis (HCC) 04/19/2018  . Suicide attempt by benzodiazepine overdose (HCC) 04/19/2018    Eulis Foster, PT 06/18/18 4:17 PM   [facility] [address] [place], [place], [address] Phone: [phone]   Fax:  [phone]  Name: HAVIN HEENEY MRN: [id number] Date of Birth: January 28, 2001

## 2018-06-25 ENCOUNTER — Ambulatory Visit: Payer: Medicaid Other | Admitting: Physical Therapy

## 2018-06-30 ENCOUNTER — Encounter: Payer: Self-pay | Admitting: Physical Therapy

## 2018-06-30 ENCOUNTER — Ambulatory Visit: Payer: Medicaid Other | Attending: Urology | Admitting: Physical Therapy

## 2018-06-30 DIAGNOSIS — R252 Cramp and spasm: Secondary | ICD-10-CM

## 2018-06-30 DIAGNOSIS — M6281 Muscle weakness (generalized): Secondary | ICD-10-CM | POA: Diagnosis present

## 2018-06-30 DIAGNOSIS — R293 Abnormal posture: Secondary | ICD-10-CM | POA: Insufficient documentation

## 2018-06-30 NOTE — Therapy (Signed)
[facility] [address] [place], [place], [address] Phone: [phone]   Fax:  [phone]  Physical Therapy Treatment  Patient Details  Name: Alexis GeneCasey M Karstens MRN: [id number] Date of Birth: 09/25/2000 Referring Provider (PT): Burman FosterBree Tharpe Fleck NP   Encounter Date: 06/30/2018  PT End of Session - 06/30/18 1703    Visit Number  16    Date for PT Re-Evaluation  07/31/18    Authorization Type  Medicaid    Authorization Time Period  05/12/2018 to 08/03/2018 for 12 visits    Authorization - Visit Number  4    Authorization - Number of Visits  12    PT Start Time  1615    PT Stop Time  1700    PT Time Calculation (min)  45 min    Activity Tolerance  Patient tolerated treatment well;No increased pain    Behavior During Therapy  WFL for tasks assessed/performed       Past Medical History:  Diagnosis Date  . Anxiety   . Asthma   . Depression   . GERD (gastroesophageal reflux disease)   . Migraines     Past Surgical History:  Procedure Laterality Date  . CYSTO WITH HYDRODISTENSION N/A 04/10/2018   Procedure: CYSTOSCOPY/HYDRODISTENSION;  Surgeon: Crist FatHerrick, Benjamin W, MD;  [facility];  Service: Urology;  Laterality: N/A;  . UPPER GI ENDOSCOPY      There were no vitals filed for this visit.  Subjective Assessment - 06/30/18 1619    Subjective  I feel like I am in a little bit in a flare-up. My menstrual cycle is coming and I am stressed. I saw my urologist and he said I am doing well and keep with physical therapy. I do not have to see him for 6 months.     Patient Stated Goals  reduce pain    Currently in Pain?  Yes    Pain Score  4     Pain Location  Abdomen    Pain Orientation  Mid;Lower    Pain Descriptors / Indicators  Aching    Pain Type  Chronic pain    Pain Onset  More than a month ago    Pain Frequency  Constant    Aggravating Factors   urination, intercourse    Pain Relieving  Factors  heat and moving around    Multiple Pain Sites  No                    Pelvic Floor Special Questions - 06/30/18 0001    Pelvic Floor Internal Exam  Patient confirmed identication and approves PT to assess muscle and treatment    Exam Type  Vaginal        [facility] Adult PT Treatment/Exercise - 06/30/18 0001      Self-Care   Self-Care  Other Self-Care Comments    Other Self-Care Comments   using moisturizer to place on the vulva area to improve vulva health      Manual Therapy   Manual Therapy  Myofascial release;Internal Pelvic Floor;Muscle Energy Technique    Myofascial Release  release of the pelvic floor fascia with two hands moving in different directions    Internal Pelvic Floor  release around the bladder, urethra sphincter, levator ani, perineal body , and obturator internist   used desert harvest revelum with work    Muscle Energy Technique  to correct ilium  PT Short Term Goals - 01/08/18 1024      PT SHORT TERM GOAL #1   Title  independent with initial HEP    Time  4    Period  Weeks    Status  Achieved      PT SHORT TERM GOAL #2   Title  understand how to toilet correctly to reduce strain on the pelvic floor    Time  4    Period  Weeks    Status  Achieved        PT Long Term Goals - 06/30/18 1621      PT LONG TERM GOAL #1   Title  independent with HEP    Baseline  still learning    Time  8    Period  Days    Status  On-going      PT LONG TERM GOAL #2   Title  reduce pain with vaginal penetration with exam and penile </= 3/10    Baseline  pain level 1/10 when not in a flare-up, flare-up does not have intercourse    Time  8    Period  Weeks    Status  On-going      PT LONG TERM GOAL #3   Title  burining with urination decreased >/= 75% due to ability to relax the pelvic floor    Baseline  50% better, pain level is 7/10    Time  8    Period  Weeks    Status  On-going            Plan - 06/30/18 1623     Clinical Impression Statement  Patient is having a flare-up due to her menstural cycle going to start next week. Patient reports her pain level with urination is 7/10 and 50% better. Patient does not have intercourse when she is having a flare-up. Patient pain level with intercourse when she is not having a flare-up is 1/0. Patient pelvis was out of alignment but after manual work was in correct alignment. Patient muscle softened after soft tissue work. After release of the tissue around the urethra there was less pain to touch. Patient will benefit from skilled therapy to work on decreasing spasms of the pelvic floor to reduce pain and burning with urination.     Rehab Potential  Excellent    Clinical Impairments Affecting Rehab Potential  none    PT Frequency  1x / week    PT Duration  Other (comment)   4 months   PT Treatment/Interventions  Biofeedback;Cryotherapy;Electrical Stimulation;Moist Heat;Ultrasound;Therapeutic exercise;Therapeutic activities;Neuromuscular re-education;Patient/family education;Manual techniques;Dry needling    PT Next Visit Plan  internal soft tissue work internally and around vaginally; pelvic stabilization    PT Home Exercise Plan  ACCESS CODE: [facility]    Consulted and Agree with Plan of Care  Patient       Patient will benefit from skilled therapeutic intervention in order to improve the following deficits and impairments:  Increased fascial restricitons, Pain, Decreased mobility, Increased muscle spasms, Postural dysfunction, Decreased activity tolerance, Decreased range of motion, Decreased strength  Visit Diagnosis: Muscle weakness (generalized)  Abnormal posture  Cramp and spasm     Problem List Patient Active Problem List   Diagnosis Date Noted  . MDD (major depressive disorder), recurrent severe, without psychosis (HCC) 04/19/2018  . Suicide attempt by benzodiazepine overdose (HCC) 04/19/2018    Eulis Foster, PT 06/30/18 5:08 PM   [facility] [address] [place], [place], [address] Phone: [phone]   Fax:  [phone]  Name: LATARSHIA JERSEY MRN: [id number] Date of Birth: 2001/04/03

## 2018-07-02 ENCOUNTER — Encounter: Payer: Self-pay | Admitting: Physical Therapy

## 2018-07-07 ENCOUNTER — Encounter: Payer: Self-pay | Admitting: Physical Therapy

## 2018-07-09 ENCOUNTER — Encounter: Payer: Self-pay | Admitting: Physical Therapy

## 2018-07-14 ENCOUNTER — Ambulatory Visit: Payer: Medicaid Other | Admitting: Physical Therapy

## 2018-07-14 ENCOUNTER — Encounter: Payer: Self-pay | Admitting: Physical Therapy

## 2018-07-14 DIAGNOSIS — M6281 Muscle weakness (generalized): Secondary | ICD-10-CM | POA: Diagnosis not present

## 2018-07-14 DIAGNOSIS — R293 Abnormal posture: Secondary | ICD-10-CM

## 2018-07-14 DIAGNOSIS — R252 Cramp and spasm: Secondary | ICD-10-CM

## 2018-07-14 NOTE — Therapy (Signed)
[facility] [address], [address] [place], [place], [address] Phone: [phone]   Fax:  [phone]  Physical Therapy Treatment  Patient Details  Name: Alexis [name] MRN: [id number] Date of Birth: 05/16/2001 Referring Provider (Alexis [name]): Burman FosterBree Tharpe Fleck NP   Encounter Date: 07/14/2018  Alexis [name] End of Session - 07/14/18 1525    Visit Number  17    Date for Alexis [name] Re-Evaluation  07/31/18    Authorization Type  Medicaid    Authorization Time Period  05/12/2018 to 08/03/2018 for 12 visits    Authorization - Visit Number  5    Authorization - Number of Visits  12    Alexis [name] Start Time  1445    Alexis [name] Stop Time  1525    Alexis [name] Time Calculation (min)  40 min    Activity Tolerance  Patient tolerated treatment well;No increased pain    Behavior During Therapy  WFL for tasks assessed/performed       Past Medical History:  Diagnosis Date  . Anxiety   . Asthma   . Depression   . GERD (gastroesophageal reflux disease)   . Migraines     Past Surgical History:  Procedure Laterality Date  . CYSTO WITH HYDRODISTENSION N/A 04/10/2018   Procedure: CYSTOSCOPY/HYDRODISTENSION;  Surgeon: Crist FatHerrick, Benjamin W, MD;  [facility];  Service: Urology;  Laterality: N/A;  . UPPER GI ENDOSCOPY      There were no vitals filed for this visit.  Subjective Assessment - 07/14/18 1448    Subjective  I feel better than last time. I feel the internal work is helping. Discomfort with intercourse when there is pressure on the bladder and pain level 1/10. I got the Good lubricant and it is helping. My back is doing better. I need to work on my posture.     Patient Stated Goals  reduce pain    Currently in Pain?  Yes    Pain Score  1     Pain Location  Abdomen    Pain Orientation  Mid;Lower    Pain Onset  More than a month ago    Pain Frequency  Intermittent    Aggravating Factors   with urination    Pain Relieving Factors  heat and moving around    Multiple Pain Sites  No                    Pelvic Floor Special Questions - 07/14/18 0001    Pelvic Floor Internal Exam  Patient confirmed identication and approves Alexis [name] to assess muscle and treatment    Exam Type  Vaginal    Strength  good squeeze, good lift, able to hold agaisnt strong resistance        [facility] Adult Alexis [name] Treatment/Exercise - 07/14/18 0001      Manual Therapy   Manual Therapy  Internal Pelvic Floor    Internal Pelvic Floor  release around the bladder, urethra sphincter, levator ani, perineal body , and obturator internist               Alexis [name] Short Term Goals - 01/08/18 1024      Alexis [name] SHORT TERM GOAL #1   Title  independent with initial HEP    Time  4    Period  Weeks    Status  Achieved      Alexis [name] SHORT TERM GOAL #2   Title  understand how to toilet correctly to reduce strain on the pelvic  floor    Time  4    Period  Weeks    Status  Achieved        Alexis [name] Long Term Goals - 07/14/18 1530      Alexis [name] LONG TERM GOAL #1   Title  independent with HEP    Baseline  still learning    Time  8    Period  Days    Status  On-going      Alexis [name] LONG TERM GOAL #2   Title  reduce pain with vaginal penetration with exam and penile </= 3/10    Baseline  pain level 1/10 when not in a flare-up, flare-up does not have intercourse    Time  8    Period  Weeks    Status  On-going      Alexis [name] LONG TERM GOAL #3   Title  burining with urination decreased >/= 75% due to ability to relax the pelvic floor    Time  8    Period  Weeks    Status  On-going      Alexis [name] LONG TERM GOAL #4   Title  reduce straining with bowel movement >/= 75% due to relaxation of pelvic floor muscles    Time  8    Period  Weeks    Status  Achieved      Alexis [name] LONG TERM GOAL #5   Title  sitting for 30 minutes wiith pain level </3-4/10 due to decreased muscle tension     Baseline  pain 2/10    Time  12    Period  Weeks    Status  Achieved            Plan - 07/14/18 1453    Clinical  Impression Statement  Patient pain with intercourse is now 1/10. Patient pain level is better since last visit. Patient has been doing her exercises. Patient pain level with urination is 110. Patient pelvic floor strength is 4/5. Patient had restrictions on the side of the bladder. Patient has less pain around the urethra. Patient will benefit from skilled therapy to work on decreasing spasms of the pelvic floor to reduce pain and burning with urination.     Rehab Potential  Excellent    Clinical Impairments Affecting Rehab Potential  none    Alexis [name] Frequency  1x / week    Alexis [name] Duration  Other (comment)   4 months   Alexis [name] Treatment/Interventions  Biofeedback;Cryotherapy;Electrical Stimulation;Moist Heat;Ultrasound;Therapeutic exercise;Therapeutic activities;Neuromuscular re-education;Patient/family education;Manual techniques;Dry needling    Alexis [name] Next Visit Plan  internal soft tissue work internally and around vaginally; pelvic stabilization    Alexis [name] Home Exercise Plan  ACCESS CODE: [facility]    Consulted and Agree with Plan of Care  Patient       Patient will benefit from skilled therapeutic intervention in order to improve the following deficits and impairments:  Increased fascial restricitons, Pain, Decreased mobility, Increased muscle spasms, Postural dysfunction, Decreased activity tolerance, Decreased range of motion, Decreased strength  Visit Diagnosis: Muscle weakness (generalized)  Cramp and spasm  Abnormal posture     Problem List Patient Active Problem List   Diagnosis Date Noted  . MDD (major depressive disorder), recurrent severe, without psychosis (HCC) 04/19/2018  . Suicide attempt by benzodiazepine overdose (HCC) 04/19/2018    Alexis [name], Alexis [name] 07/14/18 3:31 PM   [facility] [address] [place], [place], [address] Phone: [phone]   Fax:  [phone]  Name: Alexis [name]  MRN: [id number] Date of Birth:  04/05/2001

## 2018-07-21 ENCOUNTER — Encounter: Payer: Self-pay | Admitting: Physical Therapy

## 2018-07-21 ENCOUNTER — Ambulatory Visit: Payer: Medicaid Other | Admitting: Physical Therapy

## 2018-07-21 DIAGNOSIS — R252 Cramp and spasm: Secondary | ICD-10-CM

## 2018-07-21 DIAGNOSIS — R293 Abnormal posture: Secondary | ICD-10-CM

## 2018-07-21 DIAGNOSIS — M6281 Muscle weakness (generalized): Secondary | ICD-10-CM

## 2018-07-21 NOTE — Patient Instructions (Signed)
STRETCHING THE PELVIC FLOOR MUSCLES NO DILATOR  Supplies . Vaginal lubricant . Mirror (optional) . Gloves (optional) Positioning . Start in a semi-reclined position with your head propped up. Bend your knees and place your thumb or finger at the vaginal opening. Procedure . Apply a moderate amount of lubricant on the outer skin of your vagina, the labia minora.  Apply additional lubricant to your finger. . Spread the skin away from the vaginal opening. Place the end of your finger at the opening. . Do a maximum contraction of the pelvic floor muscles. Tighten the vagina and the anus maximally and relax. . When you know they are relaxed, gently and slowly insert your finger into your vagina, directing your finger slightly downward, for 2-3 inches of insertion. . Relax and stretch the 6 o'clock position . Hold each stretch for _2 min__ and repeat __1_ time with rest breaks of _1__ seconds between each stretch. . Repeat the stretching in the 4 o'clock and 8 o'clock positions. . Total time should be _6__ minutes, _1__ x per day.  Note the amount of theme your were able to achieve and your tolerance to your finger in your vagina. . Once you have accomplished the techniques you may try them in standing with one foot resting on the tub, or in other positions.  This is a good stretch to do in the shower if you don't need to use lubricant.   [facility] [address], [address] [place], [place] [address] Phone # [phone] Fax [phone]  

## 2018-07-21 NOTE — Therapy (Signed)
[facility] [address], [address] [place], [place], [address] Phone: [phone]   Fax:  [phone]  Physical Therapy Treatment  Patient Details  Name: Alexis [name] MRN: [id number] Date of Birth: [birth year] Referring Provider (PT): Burman Foster NP   Encounter Date: 07/21/2018  PT End of Session - 07/21/18 1448    Visit Number  18    Date for PT Re-Evaluation  07/31/18    Authorization Type  Medicaid    Authorization Time Period  05/12/2018 to 08/03/2018 for 12 visits    Authorization - Visit Number  6    Authorization - Number of Visits  12    PT Start Time  1445    PT Stop Time  1525    PT Time Calculation (min)  40 min    Activity Tolerance  Patient tolerated treatment well;No increased pain    Behavior During Therapy  WFL for tasks assessed/performed       Past Medical History:  Diagnosis Date  . Anxiety   . Asthma   . Depression   . GERD (gastroesophageal reflux disease)   . Migraines     Past Surgical History:  Procedure Laterality Date  . CYSTO WITH HYDRODISTENSION N/A 04/10/2018   Procedure: CYSTOSCOPY/HYDRODISTENSION;  Surgeon: Crist Fat, MD;  Location: [facility];  Service: Urology;  Laterality: N/A;  . UPPER GI ENDOSCOPY      There were no vitals filed for this visit.  Subjective Assessment - 07/21/18 1447    Subjective  I am in a little bit of a flare-up. I am not sure. Internal soft tissue work has been helping.     Patient Stated Goals  reduce pain    Currently in Pain?  Yes    Pain Score  4     Pain Location  Vagina   urethra   Pain Orientation  Mid    Pain Descriptors / Indicators  Dull    Pain Type  Chronic pain    Pain Frequency  Constant    Aggravating Factors   with urination    Pain Relieving Factors  heat and moving around    Multiple Pain Sites  No                    Pelvic Floor Special Questions - 07/21/18 0001    Pelvic Floor  Internal Exam  Patient confirmed identication and approves PT to assess muscle and treatment    Exam Type  Vaginal        [facility] Adult PT Treatment/Exercise - 07/21/18 0001      Manual Therapy   Manual Therapy  Internal Pelvic Floor;Soft tissue mobilization;Myofascial release    Myofascial Release  release of the bladder, pubovesical ligament, around the umbilicus with hips on wedge, release of the intestines off the bladder    Internal Pelvic Floor  release around the bladder, soft tissue work on the perineal body, introitus, and bulbocavernosus             PT Education - 07/21/18 1526    Education Details  self perineal soft tissue work    [employer]) Educated  Patient    Methods  Explanation;Handout    Comprehension  Verbalized understanding       PT Short Term Goals - 01/08/18 1024      PT SHORT TERM GOAL #1   Title  independent with initial HEP    Time  4  Period  Weeks    Status  Achieved      PT SHORT TERM GOAL #2   Title  understand how to toilet correctly to reduce strain on the pelvic floor    Time  4    Period  Weeks    Status  Achieved        PT Long Term Goals - 07/14/18 1530      PT LONG TERM GOAL #1   Title  independent with HEP    Baseline  still learning    Time  8    Period  Days    Status  On-going      PT LONG TERM GOAL #2   Title  reduce pain with vaginal penetration with exam and penile </= 3/10    Baseline  pain level 1/10 when not in a flare-up, flare-up does not have intercourse    Time  8    Period  Weeks    Status  On-going      PT LONG TERM GOAL #3   Title  burining with urination decreased >/= 75% due to ability to relax the pelvic floor    Time  8    Period  Weeks    Status  On-going      PT LONG TERM GOAL #4   Title  reduce straining with bowel movement >/= 75% due to relaxation of pelvic floor muscles    Time  8    Period  Weeks    Status  Achieved      PT LONG TERM GOAL #5   Title  sitting for 30 minutes wiith  pain level </3-4/10 due to decreased muscle tension     Baseline  pain 2/10    Time  12    Period  Weeks    Status  Achieved            Plan - 07/21/18 1449    Clinical Impression Statement  Patient had increased burning in the introitus with soft tissue work. Patient had increased tightness in the lower abdomen over the bladder area. Patient pain level decreaesd to 3/10 after manaual work. Patient introitus was tighter today and took awhile to have the therapist insert her index finger into the canal. Patient will benefit from skilled therapy to work on decreasing spasms of the pelvic floor to reduce pain and burning with urination.     Rehab Potential  Excellent    Clinical Impairments Affecting Rehab Potential  none    PT Frequency  1x / week    PT Duration  Other (comment)   4 months   PT Treatment/Interventions  Biofeedback;Cryotherapy;Electrical Stimulation;Moist Heat;Ultrasound;Therapeutic exercise;Therapeutic activities;Neuromuscular re-education;Patient/family education;Manual techniques;Dry needling    PT Next Visit Plan  determine if renewal or discharge, if renewal put into medicaid; soft tissue work internally    PT Home Exercise Plan  ACCESS CODE: [facility]    Consulted and Agree with Plan of Care  Patient       Patient will benefit from skilled therapeutic intervention in order to improve the following deficits and impairments:  Increased fascial restricitons, Pain, Decreased mobility, Increased muscle spasms, Postural dysfunction, Decreased activity tolerance, Decreased range of motion, Decreased strength  Visit Diagnosis: Muscle weakness (generalized)  Cramp and spasm  Abnormal posture     Problem List Patient Active Problem List   Diagnosis Date Noted  . MDD (major depressive disorder), recurrent severe, without psychosis (HCC) 04/19/2018  . Suicide attempt by benzodiazepine overdose (HCC)  04/19/2018    Eulis Foster, PT 07/21/18 3:31 PM ' [facility] [address] [place], [place], [address] Phone: [phone]   Fax:  [phone]  Name: SASHEEN LAGRASSA MRN: [id number] Date of Birth: February 22, 2001

## 2018-07-28 ENCOUNTER — Ambulatory Visit: Payer: Medicaid Other | Attending: Urology | Admitting: Physical Therapy

## 2018-07-28 ENCOUNTER — Encounter: Payer: Self-pay | Admitting: Physical Therapy

## 2018-07-28 DIAGNOSIS — R252 Cramp and spasm: Secondary | ICD-10-CM | POA: Diagnosis present

## 2018-07-28 DIAGNOSIS — M6281 Muscle weakness (generalized): Secondary | ICD-10-CM

## 2018-07-28 DIAGNOSIS — R293 Abnormal posture: Secondary | ICD-10-CM | POA: Diagnosis present

## 2018-07-28 NOTE — Therapy (Signed)
[facility] [address], [address] [place], [place], [address] Phone: [phone]   Fax:  [phone]  Physical Therapy Treatment  Patient Details  Name: Alexis [name] MRN: [id number] Date of Birth: [birth year] Referring Provider (PT): Burman Foster NP   Encounter Date: 07/28/2018  PT End of Session - 07/28/18 1445    Visit Number  19    Date for PT Re-Evaluation  10/23/18    Authorization Type  Medicaid    Authorization Time Period  05/12/2018 to 08/03/2018 for 12 visits    Authorization - Visit Number  7    Authorization - Number of Visits  12    PT Start Time  1445    PT Stop Time  1525    PT Time Calculation (min)  40 min    Activity Tolerance  Patient tolerated treatment well;No increased pain    Behavior During Therapy  WFL for tasks assessed/performed       Past Medical History:  Diagnosis Date  . Anxiety   . Asthma   . Depression   . GERD (gastroesophageal reflux disease)   . Migraines     Past Surgical History:  Procedure Laterality Date  . CYSTO WITH HYDRODISTENSION N/A 04/10/2018   Procedure: CYSTOSCOPY/HYDRODISTENSION;  Surgeon: Crist Fat, MD;  Location: [facility];  Service: Urology;  Laterality: N/A;  . UPPER GI ENDOSCOPY      There were no vitals filed for this visit.  Subjective Assessment - 07/28/18 1445    Subjective  I feel good today. I am not really in pain. When I urinate is is 0-1/10. buring pain with urination improved by 80%. Pain with intercourse is 70% improved)     Patient Stated Goals  reduce pain    Currently in Pain?  No/denies    Pain Score  1     Pain Location  Vagina    Pain Descriptors / Indicators  Aching    Pain Type  Chronic pain    Pain Onset  More than a month ago    Pain Frequency  Intermittent    Aggravating Factors   with urination and penile penetration    Pain Relieving Factors  heat and moving around    Multiple Pain Sites  No          [facility] PT Assessment - 07/28/18 0001      Assessment   Medical Diagnosis  N30.20 Chronic cystitis    Referring Provider (PT)  Wallace Cullens Ronald Pippins NP    Onset Date/Surgical Date  01/25/17    Prior Therapy  none      Precautions   Precautions  None      Restrictions   Weight Bearing Restrictions  No      Home Environment   Living Environment  Private residence      Prior Function   Level of Independence  Independent    Vocation  Student    Leisure  none      Cognition   Overall Cognitive Status  Within Functional Limits for tasks assessed                   [facility] Adult PT Treatment/Exercise - 07/28/18 0001      Manual Therapy   Manual Therapy  Myofascial release;Internal Pelvic Floor    Myofascial Release  release of the bladder, pubovesical ligament,, release of the intestines off the bladder, release aorund the bladder while moving the hips  to stretch the tissue    Internal Pelvic Floor  release around the uretrha=and sides of bladder               PT Short Term Goals - 01/08/18 1024      PT SHORT TERM GOAL #1   Title  independent with initial HEP    Time  4    Period  Weeks    Status  Achieved      PT SHORT TERM GOAL #2   Title  understand how to toilet correctly to reduce strain on the pelvic floor    Time  4    Period  Weeks    Status  Achieved        PT Long Term Goals - 07/28/18 1450      PT LONG TERM GOAL #1   Title  independent with HEP    Baseline  still learning    Time  8    Period  Days    Status  On-going      PT LONG TERM GOAL #2   Title  reduce pain with vaginal penetration with exam and penile </= 3/10    Baseline  pain level 3-6/10 ; depends on if she is having a flare-up, helps if apply heat before and after, on top    Time  8    Period  Weeks    Status  On-going      PT LONG TERM GOAL #3   Title  burining with urination decreased >/= 75% due to ability to relax the pelvic floor    Baseline  80% better     Time  8    Period  Weeks    Status  Achieved      PT LONG TERM GOAL #4   Title  reduce straining with bowel movement >/= 75% due to relaxation of pelvic floor muscles    Time  8    Period  Weeks    Status  Achieved      PT LONG TERM GOAL #5   Title  sitting for 30 minutes wiith pain level </3-4/10 due to decreased muscle tension     Time  12    Period  Weeks    Status  Achieved            Plan - 07/28/18 1526    Clinical Impression Statement  Patient is not having urinary leakage. Patient pelvic floor strength is 4/5. Patient has increased mobility of fascial around the bladder and urethra internally and externally. Patient reports her urinary burning is 80% better when bladder is not full and not having a flareup. Patient pain with urination the past 2 weeks is 1/10. Patient pain is intermittent compared to constant. Patient pelvis in correct alignment. Patient back extensor strength is 3/5. Patient has pain with penile penetration that has improved by 70%. Marinoff score is 1/3. Patient will benefit from skilled therapy to work on decreasing spasms of the pelvic floor to reduce pain and burining with urination especially when she is having a flare-up.     Rehab Potential  Excellent    Clinical Impairments Affecting Rehab Potential  none    PT Frequency  Biweekly    PT Duration  12 weeks    PT Treatment/Interventions  Biofeedback;Cryotherapy;Electrical Stimulation;Moist Heat;Ultrasound;Therapeutic exercise;Therapeutic activities;Neuromuscular re-education;Patient/family education;Manual techniques;Dry needling    PT Next Visit Plan  soft tissue work internally; educate on internal soft tissue work; work on bladder area  PT Home Exercise Plan  ACCESS CODE: [facility]    Recommended Other Services  sent renewal to MD 07/28/2018    Consulted and Agree with Plan of Care  Patient       Patient will benefit from skilled therapeutic intervention in order to improve the following deficits  and impairments:  Increased fascial restricitons, Pain, Decreased mobility, Increased muscle spasms, Postural dysfunction, Decreased activity tolerance, Decreased range of motion, Decreased strength  Visit Diagnosis: Muscle weakness (generalized) - Plan: PT plan of care cert/re-cert  Cramp and spasm - Plan: PT plan of care cert/re-cert  Abnormal posture - Plan: PT plan of care cert/re-cert     Problem List Patient Active Problem List   Diagnosis Date Noted  . MDD (major depressive disorder), recurrent severe, without psychosis (HCC) 04/19/2018  . Suicide attempt by benzodiazepine overdose (HCC) 04/19/2018    Eulis Foster, PT 07/28/18 3:44 PM   [facility] [address], [address] [place], [place], [address] Phone: [phone]   Fax:  [phone]  Name: VINESSA MACCONNELL MRN: [id number] Date of Birth: 09-23-2000

## 2018-08-14 ENCOUNTER — Ambulatory Visit: Payer: Medicaid Other | Admitting: Physical Therapy

## 2018-08-26 ENCOUNTER — Encounter: Payer: Self-pay | Admitting: Physical Therapy

## 2018-09-09 ENCOUNTER — Encounter: Payer: Self-pay | Admitting: Physical Therapy

## 2018-09-17 ENCOUNTER — Encounter: Payer: Self-pay | Admitting: Physical Therapy

## 2018-09-23 ENCOUNTER — Encounter: Payer: Self-pay | Admitting: Physical Therapy

## 2018-10-09 ENCOUNTER — Other Ambulatory Visit: Payer: Self-pay

## 2018-10-09 ENCOUNTER — Encounter: Payer: Self-pay | Admitting: Physical Therapy

## 2018-10-09 ENCOUNTER — Ambulatory Visit: Payer: Medicaid Other | Attending: Urology | Admitting: Physical Therapy

## 2018-10-09 DIAGNOSIS — R293 Abnormal posture: Secondary | ICD-10-CM | POA: Diagnosis present

## 2018-10-09 DIAGNOSIS — M6281 Muscle weakness (generalized): Secondary | ICD-10-CM | POA: Insufficient documentation

## 2018-10-09 DIAGNOSIS — R252 Cramp and spasm: Secondary | ICD-10-CM

## 2018-10-09 NOTE — Therapy (Signed)
[facility] [address] [place], [place], [address] Phone: [phone]   Fax:  [phone]  Physical Therapy Treatment  Patient Details  Name: Alexis [name] MRN: [id number] Date of Birth: February 07, 2001 Referring Provider (PT): Jed Limerick NP   Encounter Date: 10/09/2018  PT End of Session - 10/09/18 8270    Visit Number  20    Date for PT Re-Evaluation  10/23/18    Authorization Type  Medicaid    Authorization Time Period  5/11-8/2    Authorization - Visit Number  1    Authorization - Number of Visits  6    PT Start Time  7867    PT Stop Time  1515    PT Time Calculation (min)  43 min    Activity Tolerance  Patient tolerated treatment well    Behavior During Therapy  [facility] for tasks assessed/performed       Past Medical History:  Diagnosis Date  . Anxiety   . Asthma   . Depression   . GERD (gastroesophageal reflux disease)   . Migraines     Past Surgical History:  Procedure Laterality Date  . CYSTO WITH HYDRODISTENSION N/A 04/10/2018   Procedure: CYSTOSCOPY/HYDRODISTENSION;  Surgeon: Ardis Hughs, MD;  Location: [facility];  Service: Urology;  Laterality: N/A;  . UPPER GI ENDOSCOPY      There were no vitals filed for this visit.  Subjective Assessment - 10/09/18 1434    Subjective  I have had increased pain. I am not able to do anything due to the pain. Burning with urination is the most pain at 7/10 level. Not able to have penile penetration due to pain. No urinary leakage. Pain in bladder area.     Patient Stated Goals  reduce pain    Currently in Pain?  Yes    Pain Score  7     Pain Location  Abdomen    Pain Orientation  Mid;Lower    Pain Descriptors / Indicators  Pressure;Burning;Throbbing    Pain Type  Chronic pain    Pain Onset  More than a month ago    Pain Frequency  Intermittent    Aggravating Factors   with urination and penile penetration     Pain Relieving  Factors  heat    Multiple Pain Sites  No                    Pelvic Floor Special Questions - 10/09/18 0001    Pelvic Floor Internal Exam  Patient confirmed identication and approves PT to assess muscle and treatment    Exam Type  Vaginal    Palpation  tenderness throughout the pelvic floor muscles, vulva, and perineal body        [facility] Adult PT Treatment/Exercise - 10/09/18 0001      Neuro Re-ed    Neuro Re-ed Details   diaphragmatic breathing to bulge the pelvic floor with  bulging the abdomen with tactile and verbal cues      Manual Therapy   Manual Therapy  Myofascial release;Internal Pelvic Floor    Myofascial Release  along the vulva, bulbocavernosus ad ischiocavernous, and labia minora    Internal Pelvic Floor  release around the urethra sphincter and sides of the bladder; bil. levator ani and obturator internist and perineal body               PT Short Term Goals - 01/08/18 1024  PT SHORT TERM GOAL #1   Title  independent with initial HEP    Time  4    Period  Weeks    Status  Achieved      PT SHORT TERM GOAL #2   Title  understand how to toilet correctly to reduce strain on the pelvic floor    Time  4    Period  Weeks    Status  Achieved        PT Long Term Goals - 10/09/18 1521      PT LONG TERM GOAL #1   Title  independent with HEP    Baseline  still learning    Time  8    Period  Days    Status  On-going      PT LONG TERM GOAL #2   Title  reduce pain with vaginal penetration with exam and penile </= 3/10    Baseline  not able to due to pain level 10/10    Time  8    Period  Weeks    Status  On-going      PT LONG TERM GOAL #3   Title  burining with urination decreased >/= 75% due to ability to relax the pelvic floor    Baseline  flare- up so no change right now    Time  8    Period  Weeks    Status  Not Met            Plan - 10/09/18 1439    Clinical Impression Statement  Patient is having a flare up with 10/10  pain level in the bladder and burning with urination. The pain has made it difficult for the patient to socialize or have penile penetration. Patient has increased urgency for the bathroom and at night has to go every 15 minutes. Patient has a constant pressure feeling in the bladder. Patient has increased tightness and pain with palpation to the pelvic floor. Patient will benefit from skilled therapy to work on decreasing spasms of the pelvic floor to reduce pain and burning with urination especially when she is having a flare-up.     Rehab Potential  Excellent    Clinical Impairments Affecting Rehab Potential  none    PT Frequency  Biweekly    PT Duration  12 weeks    PT Treatment/Interventions  Biofeedback;Cryotherapy;Electrical Stimulation;Moist Heat;Ultrasound;Therapeutic exercise;Therapeutic activities;Neuromuscular re-education;Patient/family education;Manual techniques;Dry needling    PT Next Visit Plan  soft tissue work internally; educate on internal soft tissue work; work on bladder area; write renewal    PT Home Exercise Plan  ACCESS CODE: [facility]    Recommended Other Services  MD signed the renewal    Consulted and Agree with Plan of Care  Patient       Patient will benefit from skilled therapeutic intervention in order to improve the following deficits and impairments:  Increased fascial restricitons, Pain, Decreased mobility, Increased muscle spasms, Postural dysfunction, Decreased activity tolerance, Decreased range of motion, Decreased strength  Visit Diagnosis: Muscle weakness (generalized)  Cramp and spasm  Abnormal posture     Problem List Patient Active Problem List   Diagnosis Date Noted  . MDD (major depressive disorder), recurrent severe, without psychosis ([place]) 04/19/2018  . Suicide attempt by benzodiazepine overdose ([place]) 04/19/2018    Earlie Counts, PT 10/09/18 3:26 PM    [facility] [address]  [place], [place], [address] Phone: [phone]   Fax:  [phone]  Name: Alexis [name] MRN: [id number] Date of Birth: 2001-03-06

## 2018-10-20 ENCOUNTER — Encounter: Payer: Self-pay | Admitting: Physical Therapy

## 2018-10-20 ENCOUNTER — Other Ambulatory Visit: Payer: Self-pay

## 2018-10-20 ENCOUNTER — Ambulatory Visit: Payer: Medicaid Other | Admitting: Physical Therapy

## 2018-10-20 DIAGNOSIS — R293 Abnormal posture: Secondary | ICD-10-CM

## 2018-10-20 DIAGNOSIS — R252 Cramp and spasm: Secondary | ICD-10-CM

## 2018-10-20 DIAGNOSIS — M6281 Muscle weakness (generalized): Secondary | ICD-10-CM

## 2018-10-20 NOTE — Therapy (Signed)
[facility] [address], [place], [place], [address] Phone: [phone]   Fax:  [phone]  Physical Therapy Treatment  Patient Details  Name: ADRIJANA HAROS MRN: [id number] Date of Birth: [birth year] Referring Provider (PT): Jed Limerick NP   Encounter Date: 10/20/2018  PT End of Session - 10/20/18 1538    Visit Number  21    Date for PT Re-Evaluation  01/12/19    Authorization Type  Medicaid    Authorization Time Period  5/11-8/2    Authorization - Visit Number  2    Authorization - Number of Visits  6    PT Start Time  4401    PT Stop Time  1532    PT Time Calculation (min)  47 min    Activity Tolerance  Patient tolerated treatment well    Behavior During Therapy  [facility] for tasks assessed/performed       Past Medical History:  Diagnosis Date  . Anxiety   . Asthma   . Depression   . GERD (gastroesophageal reflux disease)   . Migraines     Past Surgical History:  Procedure Laterality Date  . CYSTO WITH HYDRODISTENSION N/A 04/10/2018   Procedure: CYSTOSCOPY/HYDRODISTENSION;  Surgeon: Ardis Hughs, MD;  Location: [facility];  Service: Urology;  Laterality: N/A;  . UPPER GI ENDOSCOPY      There were no vitals filed for this visit.  Subjective Assessment - 10/20/18 1448    Subjective  I am going to [place]. I want to come 1 time per week. I have been doing a little better due to being active. I am on my strict diet. When I stress, it affects my bladder. I have been doing the bulging of my pelvic floor which has helped with the consitipation and pain.  After urination I was burning.     Patient Stated Goals  reduce pain    Currently in Pain?  Yes    Pain Location  Abdomen    Pain Orientation  Mid;Lower    Pain Descriptors / Indicators  Discomfort;Burning    Pain Onset  More than a month ago    Pain Frequency  Intermittent    Aggravating Factors   with urination, stress, penile  penetration    Pain Relieving Factors  heat and diet    Multiple Pain Sites  No         [facility] PT Assessment - 10/20/18 0001      Assessment   Medical Diagnosis  N30.20 Chronic cystitis    Referring Provider (PT)  Valentino Nose Fredrich Romans NP    Onset Date/Surgical Date  01/25/17    Prior Therapy  none      Precautions   Precautions  None      Restrictions   Weight Bearing Restrictions  No      Home Environment   Living Environment  Private residence      Prior Function   Level of Independence  Independent    Vocation  Student    Leisure  none      Cognition   Overall Cognitive Status  Within Functional Limits for tasks assessed      Posture/Postural Control   Posture/Postural Control  Postural limitations    Postural Limitations  Rounded Shoulders;Forward head;Decreased thoracic kyphosis;Posterior pelvic tilt;Flexed trunk      AROM   Lumbar Extension  full lumbar extension      Strength   Right Hip Flexion  5/5    Right Hip External Rotation   4+/5    Right Hip Internal Rotation  5/5    Right Hip ABduction  4+/5    Left Hip Flexion  5/5    Left Hip Extension  4+/5    Left Hip External Rotation  5/5    Left Hip Internal Rotation  5/5    Left Hip ABduction  4+/5                Pelvic Floor Special Questions - 10/20/18 0001    Pelvic Floor Internal Exam  Patient confirmed identication and approves PT to assess muscle and treatment    Exam Type  Vaginal    Palpation  tenderness located around the urethra and bladder and posterior fourchette    Strength  good squeeze, good lift, able to hold agaisnt strong resistance        [facility] Adult PT Treatment/Exercise - 10/20/18 0001      Self-Care   Self-Care  Other Self-Care Comments    Other Self-Care Comments   using coconut oit to add mositure ot the vulva and labia due to dryness      Manual Therapy   Manual Therapy  Soft tissue mobilization;Internal Pelvic Floor    Soft tissue mobilization  lumbar  paraspinals and quadratus, quadruped pulling the lateral abddominal pulling forward; left sidely soft tissue work to the obliques and lower abdominals    Internal Pelvic Floor  release around the urethra sphincter and sides of the bladder; bil. levator ani and obturator internist and perineal body       Trigger Point Dry Needling - 10/20/18 0001    Consent Given?  Yes    Muscles Treated Back/Hip  Lumbar multifidi;Quadratus lumborum    Lumbar multifidi Response  Twitch response elicited;Palpable increased muscle length    Quadratus Lumborum Response  Twitch response elicited;Palpable increased muscle length           PT Education - 10/20/18 1537    Education Details  education on coconut oil to the perineal     Person(s) Educated  Patient    Methods  Explanation    Comprehension  Verbalized understanding       PT Short Term Goals - 01/08/18 1024      PT SHORT TERM GOAL #1   Title  independent with initial HEP    Time  4    Period  Weeks    Status  Achieved      PT SHORT TERM GOAL #2   Title  understand how to toilet correctly to reduce strain on the pelvic floor    Time  4    Period  Weeks    Status  Achieved        PT Long Term Goals - 10/20/18 1547      PT LONG TERM GOAL #1   Title  independent with HEP    Baseline  still learning    Time  8    Period  Days    Status  On-going      PT LONG TERM GOAL #2   Title  reduce pain with vaginal penetration with exam and penile </= 3/10    Baseline  not able to due to pain level 10/10    Time  8    Period  Weeks    Status  On-going      PT LONG TERM GOAL #3   Title  burining with urination decreased >/= 75%  due to ability to relax the pelvic floor    Baseline  flare- up so no change right now    Time  8    Period  Weeks    Status  Not Met      PT LONG TERM GOAL #4   Title  reduce straining with bowel movement >/= 75% due to relaxation of pelvic floor muscles    Time  8    Period  Weeks    Status  Achieved       PT LONG TERM GOAL #5   Title  sitting for 30 minutes wiith pain level </3-4/10 due to decreased muscle tension     Baseline  pain 2/10    Time  12    Period  Weeks    Status  Achieved            Plan - 10/20/18 1453    Clinical Impression Statement  Patient bladder is sensitive to her diet and stress. Patient has increased burning with urination. Patient has tenderess located on the urethra sphincter and sides of bladder. Patient perineum is dry and therapist consulted patient to use coconut oil to improve dryness. Patient has full lumbar ROM. Patient has weakness in bilateral hip abduction, hip external rotation and left hip extension. Patient has weakness in her core.  Patient is learning how to bulge her pelvic floor to relieve her pain. Patient is learning how to relax when she is stressed. Patient will benefit from skilled therapy to work on decreasing spasms of the pelvic floor to reduce pain and burning with urination especially when she is having a flare-up.     Rehab Potential  Excellent    Clinical Impairments Affecting Rehab Potential  none    PT Frequency  1x / week    PT Duration  12 weeks    PT Treatment/Interventions  Biofeedback;Cryotherapy;Electrical Stimulation;Moist Heat;Ultrasound;Therapeutic exercise;Therapeutic activities;Neuromuscular re-education;Patient/family education;Manual techniques;Dry needling    PT Next Visit Plan  patient will be in [place] till 12/01/2018; work on soft tissue work internally, work on core strength     PT [place]: [place]  sent MD renewal on 10/20/2018    Consulted and Agree with Plan of Care  Patient       Patient will benefit from skilled therapeutic intervention in order to improve the following deficits and impairments:  Increased fascial restricitons, Pain, Decreased mobility, Increased muscle spasms, Postural dysfunction, Decreased activity tolerance, Decreased range of motion,  Decreased strength  Visit Diagnosis: Muscle weakness (generalized) - Plan: PT plan of care cert/re-cert  Cramp and spasm - Plan: PT plan of care cert/re-cert  Abnormal posture - Plan: PT plan of care cert/re-cert     Problem List Patient Active Problem List   Diagnosis Date Noted  . MDD (major depressive disorder), recurrent severe, without psychosis ([place]) 04/19/2018  . Suicide attempt by benzodiazepine overdose ([place]) 04/19/2018    Earlie Counts, PT 10/20/18 3:51 PM   [facility] [address] [place], [place], [address] Phone: [phone]   Fax:  [phone]  Name: MARKEETA SCALF MRN: [id number] Date of Birth: Apr 18, 2001

## 2018-11-03 ENCOUNTER — Encounter: Payer: Medicaid Other | Admitting: Physical Therapy

## 2018-12-03 ENCOUNTER — Other Ambulatory Visit: Payer: Self-pay

## 2018-12-03 ENCOUNTER — Encounter: Payer: Self-pay | Admitting: Physical Therapy

## 2018-12-03 ENCOUNTER — Ambulatory Visit: Payer: Medicaid Other | Attending: Urology | Admitting: Physical Therapy

## 2018-12-03 DIAGNOSIS — R252 Cramp and spasm: Secondary | ICD-10-CM | POA: Diagnosis present

## 2018-12-03 DIAGNOSIS — R293 Abnormal posture: Secondary | ICD-10-CM | POA: Insufficient documentation

## 2018-12-03 DIAGNOSIS — M6281 Muscle weakness (generalized): Secondary | ICD-10-CM | POA: Diagnosis present

## 2018-12-03 NOTE — Therapy (Signed)
[facility] [address], [place], [place], [address] Phone: [phone]   Fax:  [phone]  Physical Therapy Treatment  Patient Details  Name: Alexis [name] MRN: [id number] Date of Birth: 16-Mar-2001 Referring Provider (PT): Jed Limerick NP   Encounter Date: 12/03/2018  PT End of Session - 12/03/18 1404    Visit Number  22    Date for PT Re-Evaluation  01/12/19    Authorization Type  Medicaid    Authorization Time Period  5/11-8/2    Authorization - Visit Number  3    Authorization - Number of Visits  6    PT Start Time  1400    PT Stop Time  1440    PT Time Calculation (min)  40 min    Activity Tolerance  Patient tolerated treatment well    Behavior During Therapy  [facility] for tasks assessed/performed       Past Medical History:  Diagnosis Date  . Anxiety   . Asthma   . Depression   . GERD (gastroesophageal reflux disease)   . Migraines     Past Surgical History:  Procedure Laterality Date  . CYSTO WITH HYDRODISTENSION N/A 04/10/2018   Procedure: CYSTOSCOPY/HYDRODISTENSION;  Surgeon: Ardis Hughs, MD;  Location: [facility];  Service: Urology;  Laterality: N/A;  . UPPER GI ENDOSCOPY      There were no vitals filed for this visit.  Subjective Assessment - 12/03/18 1404    Subjective  I have been hurting lately. The stress of coming home and stress of my sister. I have to wake up 4-6 times per night due to urgency. Urgency at night.    Patient Stated Goals  reduce pain    Currently in Pain?  Yes    Pain Score  7     Pain Location  Abdomen    Pain Orientation  Lower    Pain Descriptors / Indicators  Discomfort;Burning    Pain Type  Chronic pain    Pain Onset  More than a month ago    Pain Frequency  Intermittent    Aggravating Factors   with urinaton, stress, penile penetration    Pain Relieving Factors  heat and diet    Multiple Pain Sites  No                        [facility] Adult PT Treatment/Exercise - 12/03/18 0001      Self-Care   Self-Care  Other Self-Care Comments    Other Self-Care Comments   urge to void at night to reduce the amount of time she has to urinate at night      Manual Therapy   Manual Therapy  Internal Pelvic Floor    Internal Pelvic Floor  release around the urethra sphincter and sides of the bladder; bil. levator ani and obturator internist and perineal body             PT Education - 12/03/18 1449    Education Details  urge to void    Person(s) Educated  Patient    Methods  Explanation;Demonstration;Handout    Comprehension  Returned demonstration;Verbalized understanding       PT Short Term Goals - 01/08/18 1024      PT SHORT TERM GOAL #1   Title  independent with initial HEP    Time  4    Period  Weeks    Status  Achieved  PT SHORT TERM GOAL #2   Title  understand how to toilet correctly to reduce strain on the pelvic floor    Time  4    Period  Weeks    Status  Achieved        PT Long Term Goals - 12/03/18 1456      PT LONG TERM GOAL #1   Title  independent with HEP    Baseline  still learning    Time  8    Period  Days    Status  On-going      PT LONG TERM GOAL #2   Title  reduce pain with vaginal penetration with exam and penile </= 3/10    Baseline  not able to due to pain level 10/10    Time  8    Period  Weeks    Status  On-going      PT LONG TERM GOAL #3   Title  burining with urination decreased >/= 75% due to ability to relax the pelvic floor    Baseline  flare- up so no change right now    Time  8    Period  Weeks    Status  On-going            Plan - 12/03/18 1449    Clinical Impression Statement  Patient has not been to therapy since 10/20/2018 due to her being out of town. Patient came back into town on 08/31/2018. Patient reports she has to go to the bathroom 4-6 tmes at night. She has learned urge to void technique to elongate the  next time she has to urinate. Patient had tightness in bilateral urethra sphincter, left obturator internist  and perineal body. Patient has not been able to perform her soft tissue work on the perineal area. Patient will benefit from skilled therapy to work on decreasing spasms of the pelvic floor to reduce pain and burning with urination especially when she is having a flare-up.    Rehab Potential  Excellent    Clinical Impairments Affecting Rehab Potential  none    PT Frequency  1x / week    PT Duration  12 weeks    PT Treatment/Interventions  Biofeedback;Cryotherapy;Electrical Stimulation;Moist Heat;Ultrasound;Therapeutic exercise;Therapeutic activities;Neuromuscular re-education;Patient/family education;Manual techniques;Dry needling    PT Next Visit Plan  work on soft tissue work internally, work on core strength    PT Home Exercise Plan  [email or web address]    Recommended Other Services  MD signed renewal    Consulted and Agree with Plan of Care  Patient       Patient will benefit from skilled therapeutic intervention in order to improve the following deficits and impairments:  Increased fascial restricitons, Pain, Decreased mobility, Increased muscle spasms, Postural dysfunction, Decreased activity tolerance, Decreased range of motion, Decreased strength  Visit Diagnosis: 1. Muscle weakness (generalized)   2. Cramp and spasm   3. Abnormal posture        Problem List Patient Active Problem List   Diagnosis Date Noted  . MDD (major depressive disorder), recurrent severe, without psychosis ([facility]) 04/19/2018  . Suicide attempt by benzodiazepine overdose ([facility]) 04/19/2018    Eulis Fosterheryl , PT 12/03/18 2:58 PM   [facility] [address], [address] [place], [place], [id number] Phone: [phone]   Fax:  [phone]  Name: Alexis [name] MRN: [id number] Date of Birth: 11/30/2000

## 2018-12-03 NOTE — Patient Instructions (Addendum)
Relaxation Exercises with the Urge to Void   When you experience an urge to void:  FIRST  Stop and stand very still    Sit down if you can    Don't move    You need to stay very still to maintain control  SECOND Squeeze your pelvic floor muscles 5 times, like a quick flick, to keep from leaking  THIRD Relax  Take a deep breath and then let it out  Try to make the urge go away by using relaxation and visualization techniques  FINALLY When you feel the urge go away somewhat, walk normally to the bathroom.   If the urge gets suddenly stronger on the way, you may stop again and relax to regain control.  [facility] [address], [address] [place], [place] [address] Phone # [phone] Fax [phone]  

## 2018-12-15 ENCOUNTER — Other Ambulatory Visit: Payer: Self-pay

## 2018-12-15 ENCOUNTER — Ambulatory Visit: Payer: Medicaid Other | Admitting: Physical Therapy

## 2018-12-15 ENCOUNTER — Encounter: Payer: Self-pay | Admitting: Physical Therapy

## 2018-12-15 DIAGNOSIS — R293 Abnormal posture: Secondary | ICD-10-CM

## 2018-12-15 DIAGNOSIS — M6281 Muscle weakness (generalized): Secondary | ICD-10-CM

## 2018-12-15 DIAGNOSIS — R252 Cramp and spasm: Secondary | ICD-10-CM

## 2018-12-15 NOTE — Patient Instructions (Addendum)
apps to use when you have the urgency- Calm, Mindspace  Look into getting vaginal wand for soft tissue work  STRETCHING THE PELVIC FLOOR MUSCLES NO DILATOR  Supplies . Vaginal lubricant . Mirror (optional) . Gloves (optional) Positioning . Start in a semi-reclined position with your head propped up. Bend your knees and place your thumb or finger at the vaginal opening. Procedure . Apply a moderate amount of lubricant on the outer skin of your vagina, the labia minora.  Apply additional lubricant to your finger. Marland Kitchen Spread the skin away from the vaginal opening. Place the end of your finger at the opening. . Do a maximum contraction of the pelvic floor muscles. Tighten the vagina and the anus maximally and relax. . When you know they are relaxed, gently and slowly insert your finger into your vagina, directing your finger slightly downward, for 2-3 inches of insertion. . Relax and stretch the 6 o'clock position . Hold each stretch for _2 min__ and repeat __1_ time with rest breaks of _1__ seconds between each stretch. . Repeat the stretching in the 4 o'clock and 8 o'clock positions. . Total time should be _6__ minutes, _1__ x per day.  Note the amount of theme your were able to achieve and your tolerance to your finger in your vagina. . Once you have accomplished the techniques you may try them in standing with one foot resting on the tub, or in other positions.  This is a good stretch to do in the shower if you don't need to use lubricant.  [facility] [address], [place], [place] [address] Phone # [phone] Fax [phone]

## 2018-12-15 NOTE — Therapy (Signed)
[facility] [address], [address] [place], [place], [address] Phone: [phone]   Fax:  [phone]  Physical Therapy Treatment  Patient Details  Name: Alexis [name] MRN: [id number] Date of Birth: 12/13/2000 Referring Provider (PT): Burman FosterBree Tharpe Fleck NP   Encounter Date: 12/15/2018  PT End of Session - 12/15/18 1408    Visit Number  23    Date for PT Re-Evaluation  03/09/19    Authorization Type  Medicaid    Authorization Time Period  5/11-8/2    Authorization - Visit Number  4    Authorization - Number of Visits  6    PT Start Time  1400    PT Stop Time  1445    PT Time Calculation (min)  45 min    Activity Tolerance  Patient tolerated treatment well    Behavior During Therapy  [facility] for tasks assessed/performed       Past Medical History:  Diagnosis Date  . Anxiety   . Asthma   . Depression   . GERD (gastroesophageal reflux disease)   . Migraines     Past Surgical History:  Procedure Laterality Date  . CYSTO WITH HYDRODISTENSION N/A 04/10/2018   Procedure: CYSTOSCOPY/HYDRODISTENSION;  Surgeon: Crist FatHerrick, Benjamin W, MD;  [facility];  Service: Urology;  Laterality: N/A;  . UPPER GI ENDOSCOPY      There were no vitals filed for this visit.  Subjective Assessment - 12/15/18 1405    Subjective  I think I see the doctor in August. After last visit I felt better and now I do not feel great. I am in alot of pain with burning and urgency.    Patient Stated Goals  reduce pain    Currently in Pain?  Yes    Pain Score  8     Pain Location  Vagina   pelvic floor and abdominal   Pain Orientation  Lower    Pain Descriptors / Indicators  Burning;Discomfort;Throbbing    Pain Type  Chronic pain    Pain Onset  More than a month ago    Pain Frequency  Constant    Aggravating Factors   with urination, penile penetration, stress    Pain Relieving Factors  heat and diet    Multiple Pain Sites  No          [facility] PT Assessment - 12/15/18 0001      Assessment   Medical Diagnosis  N30.20 Chronic cystitis    Referring Provider (PT)  Wallace CullensBree Ronald Pippinsharpe Fleck NP    Onset Date/Surgical Date  01/25/17    Prior Therapy  none      Precautions   Precautions  None      Restrictions   Weight Bearing Restrictions  No      Home Environment   Living Environment  Private residence      Prior Function   Level of Independence  Independent    Vocation  Student    Leisure  none      Cognition   Overall Cognitive Status  Within Functional Limits for tasks assessed      Posture/Postural Control   Posture/Postural Control  Postural limitations    Postural Limitations  Rounded Shoulders;Forward head;Decreased thoracic kyphosis;Posterior pelvic tilt;Flexed trunk      AROM   Lumbar Flexion  decreased by 25%    Lumbar Extension  decreased by 50%    Lumbar - Right Side Bend  decreased by 25%  Lumbar - Left Side Bend  decreased by 25%      Strength   Right Hip External Rotation   4+/5    Right Hip ABduction  4+/5    Left Hip Extension  5/5    Left Hip ABduction  5/5      Palpation   SI assessment   pelvis in correct alignment                Pelvic Floor Special Questions - 12/15/18 0001    Urinary Leakage  No    Pelvic Floor Internal Exam  Patient confirmed identication and approves PT to assess muscle and treatment    Exam Type  Vaginal    Palpation  tenderness located around the urethra and bladder and posterior fourchette    Strength  good squeeze, good lift, able to hold agaisnt strong resistance        [facility] Adult PT Treatment/Exercise - 12/15/18 0001      Self-Care   Self-Care  Other Self-Care Comments    Other Self-Care Comments   education on purchasing a pelvic wand to work on trigger points in the pelvic floor; Education on using apps for meditation including midspace and calm      Manual Therapy   Manual Therapy  Internal Pelvic Floor    Internal Pelvic Floor   release around the urethra sphincter and sides of the bladder; bil. levator ani and obturator internist and perineal body             PT Education - 12/15/18 1447    Education Details  instruction on meditation apps, how to perfrom perineal massage and getting a vaginal wand    Person(s) Educated  Patient    Methods  Explanation;Demonstration;Verbal cues;Handout    Comprehension  Returned demonstration;Verbalized understanding       PT Short Term Goals - 01/08/18 1024      PT SHORT TERM GOAL #1   Title  independent with initial HEP    Time  4    Period  Weeks    Status  Achieved      PT SHORT TERM GOAL #2   Title  understand how to toilet correctly to reduce strain on the pelvic floor    Time  4    Period  Weeks    Status  Achieved        PT Long Term Goals - 12/15/18 1459      PT LONG TERM GOAL #1   Title  independent with HEP    Baseline  still learning as she progresses    Time  8    Period  Days    Status  On-going      PT LONG TERM GOAL #2   Title  reduce pain with vaginal penetration with exam and penile </= 3/10    Baseline  not able to due to pain level 10/10    Time  8    Period  Weeks    Status  On-going      PT LONG TERM GOAL #3   Title  burining with urination decreased >/= 75% due to ability to relax the pelvic floor    Baseline  flare- up so no change right now    Time  8    Period  Weeks    Status  On-going            Plan - 12/15/18 1452    Clinical Impression Statement  Patient has  chronic pelvic pain that will vary in pain level daily. Patient presently is having increased spasms in the pelvic floor muscles after being away for 1 month. Patient is using her stretches, diet, and pain management techniques to reduce her pain. Patient lumbar ROM is limited by 25%. Patient has weakness in the right hip abduction and extension. Patient continues to have urge to void especially when she goes to sleep. Patient is able to sleep through the  night. Patient continues to have burning with urination that averages 8/10. Patient has tenderness and tightness in the perineal body, bilateral urethra sphincter, levator ani, and obturator internist. Patient has learned to perform self massage to the perineal area. Patient will benefit from skilled therapy to work on decreasing spasms of the pelvic floor to  reduce pain and burining with urnation especially when she is having a flare-up.    Rehab Potential  Excellent    Clinical Impairments Affecting Rehab Potential  none    PT Frequency  1x / week    PT Treatment/Interventions  Biofeedback;Cryotherapy;Electrical Stimulation;Moist Heat;Ultrasound;Therapeutic exercise;Therapeutic activities;Neuromuscular re-education;Patient/family education;Manual techniques;Dry needling    PT Next Visit Plan  work on soft tissue work internally, work on core strength    PT Home Exercise Plan  ACCESS CODE: [facility]    Recommended Other Services  put in renewal and new Medicaid auth    Consulted and Agree with Plan of Care  Patient       Patient will benefit from skilled therapeutic intervention in order to improve the following deficits and impairments:  Increased fascial restricitons, Pain, Decreased mobility, Increased muscle spasms, Postural dysfunction, Decreased activity tolerance, Decreased range of motion, Decreased strength  Visit Diagnosis: 1. Muscle weakness (generalized)   2. Cramp and spasm   3. Abnormal posture        Problem List Patient Active Problem List   Diagnosis Date Noted  . MDD (major depressive disorder), recurrent severe, without psychosis ([place]) 04/19/2018  . Suicide attempt by benzodiazepine overdose ([place]) 04/19/2018    Earlie Counts, PT 12/15/18 3:00 PM   [facility] [address] [place], [place], [address] Phone: [phone]   Fax:  [phone]  Name: Alexis [name] MRN: [id number] Date of Birth:  August 14, 2000

## 2018-12-29 ENCOUNTER — Other Ambulatory Visit: Payer: Self-pay

## 2018-12-29 ENCOUNTER — Ambulatory Visit: Payer: Medicaid Other | Attending: Urology | Admitting: Physical Therapy

## 2018-12-29 ENCOUNTER — Encounter: Payer: Self-pay | Admitting: Physical Therapy

## 2018-12-29 DIAGNOSIS — M6281 Muscle weakness (generalized): Secondary | ICD-10-CM

## 2018-12-29 DIAGNOSIS — R293 Abnormal posture: Secondary | ICD-10-CM | POA: Insufficient documentation

## 2018-12-29 DIAGNOSIS — R252 Cramp and spasm: Secondary | ICD-10-CM | POA: Insufficient documentation

## 2018-12-29 NOTE — Therapy (Signed)
[facility] [address], [address] [place], [place], [address] Phone: [phone]   Fax:  [phone]  Physical Therapy Treatment  Patient Details  Name: Alexis GeneCasey M Bazinet MRN: [id number] Date of Birth: 05/22/2001 Referring Provider (PT): Burman FosterBree Tharpe Fleck NP   Encounter Date: 12/29/2018  PT End of Session - 12/29/18 1448    Visit Number  24    Date for PT Re-Evaluation  03/09/19    Authorization Type  Medicaid    Authorization Time Period  12/29/2018-03/22/2019    Authorization - Visit Number  1    Authorization - Number of Visits  12    PT Start Time  1449    PT Stop Time  1528    PT Time Calculation (min)  39 min    Activity Tolerance  Patient tolerated treatment well    Behavior During Therapy  [facility] for tasks assessed/performed       Past Medical History:  Diagnosis Date  . Anxiety   . Asthma   . Depression   . GERD (gastroesophageal reflux disease)   . Migraines     Past Surgical History:  Procedure Laterality Date  . CYSTO WITH HYDRODISTENSION N/A 04/10/2018   Procedure: CYSTOSCOPY/HYDRODISTENSION;  Surgeon: Crist FatHerrick, Benjamin W, MD;  [facility] ;  Service: Urology;  Laterality: N/A;  . UPPER GI ENDOSCOPY      There were no vitals filed for this visit.  Subjective Assessment - 12/29/18 1452    Subjective  Everything feels worse. I feel like I have a UTI and more urgency. I saw the doctor and they did not call back. I have been using coconut oil to the vaginal area for 1 month and it helps    Patient Stated Goals  reduce pain    Currently in Pain?  Yes    Pain Score  8     Pain Location  Vagina    Pain Orientation  Mid    Pain Descriptors / Indicators  Burning;Aching    Pain Type  Chronic pain    Pain Onset  More than a month ago    Pain Frequency  Constant    Aggravating Factors   with urination, penile penetration, stress, stress, just before her period, sitting for more than 30  minutes in a car or uncomfortable  chair, wearing tight jeans    Pain Relieving Factors  following a certain diet    Multiple Pain Sites  No                    Pelvic Floor Special Questions - 12/29/18 0001    Pelvic Floor Internal Exam  Patient confirmed identication and approves PT to assess muscle and treatment    Exam Type  Vaginal        [facility] Adult PT Treatment/Exercise - 12/29/18 0001      Therapeutic Activites    Therapeutic Activities  Other Therapeutic Activities    Other Therapeutic Activities  instruction on proper posture to reduce tightening of the pelvic floor and elongation of the spine      Lumbar Exercises: Seated   Other Seated Lumbar Exercises  sit with hands on bolster and rock back and forth breathing to expand the pelvic floor      Lumbar Exercises: Supine   Other Supine Lumbar Exercises  lay on foam roll to decompress the spine and promote elongation, alternat shoulder flexion on foam roll, bilateral shoulder flexion 10 times, on  foam roll, alternate hip flexion on foam roll with abdominal contraction; lay with thoracic on foam roll along bra line and extend over to increase trunk extension      Manual Therapy   Manual Therapy  Internal Pelvic Floor    Internal Pelvic Floor  release around the urethra sphincter while placing hand on suprapubic area,  and sides of the bladder; bil. levator ani and obturator internist and perineal body             PT Education - 12/29/18 1529    Education Details  perineal massage to elongate the vagnal tissue; posture    Person(s) Educated  Patient    Methods  Explanation;Handout    Comprehension  Verbalized understanding       PT Short Term Goals - 01/08/18 1024      PT SHORT TERM GOAL #1   Title  independent with initial HEP    Time  4    Period  Weeks    Status  Achieved      PT SHORT TERM GOAL #2   Title  understand how to toilet correctly to reduce strain on the pelvic floor    Time  4     Period  Weeks    Status  Achieved        PT Long Term Goals - 12/29/18 1532      PT LONG TERM GOAL #1   Title  independent with HEP    Baseline  still learning as she progresses    Time  8    Period  Days    Status  On-going      PT LONG TERM GOAL #2   Title  reduce pain with vaginal penetration with exam and penile </= 3/10    Baseline  not able to due to pain level 10/10    Time  8    Period  Weeks    Status  On-going      PT LONG TERM GOAL #3   Title  burining with urination decreased >/= 75% due to ability to relax the pelvic floor    Baseline  flare- up so no change right now    Time  8    Period  Weeks    Status  On-going      PT LONG TERM GOAL #4   Title  reduce straining with bowel movement >/= 75% due to relaxation of pelvic floor muscles    Time  8    Period  Weeks    Status  Achieved      PT LONG TERM GOAL #5   Title  sitting for 30 minutes wiith pain level </3-4/10 due to decreased muscle tension     Baseline  pain 2/10    Period  Weeks    Status  Achieved            Plan - 12/29/18 1529    Clinical Impression Statement  Patient has come in with 8/10 pain today in the vaginal area. Patient reports she is waiting for her next pay check to get the pelvic wand to perfrom soft tissue work to the pelvic floor muscles. Patient continues to slouch when coming into therapy and understands the importance of upright posture to reduce contraction of the pelvic floor. Patient continues to have trigger points in the pelvic floor.    Rehab Potential  Excellent    Clinical Impairments Affecting Rehab Potential  none    PT Frequency  1x / week    PT Duration  12 weeks    PT Treatment/Interventions  Biofeedback;Cryotherapy;Electrical Stimulation;Moist Heat;Ultrasound;Therapeutic exercise;Therapeutic activities;Neuromuscular re-education;Patient/family education;Manual techniques;Dry needling    PT Next Visit Plan  core strength; review posture; HEP with yoga poses;  vaginal tissue work    PT Home Exercise Plan  [facility]: [facility]    Consulted and Agree with Plan of Care  Patient       Patient will benefit from skilled therapeutic intervention in order to improve the following deficits and impairments:  Increased fascial restricitons, Pain, Decreased mobility, Increased muscle spasms, Postural dysfunction, Decreased activity tolerance, Decreased range of motion, Decreased strength  Visit Diagnosis: 1. Muscle weakness (generalized)   2. Cramp and spasm   3. Abnormal posture        Problem List Patient Active Problem List   Diagnosis Date Noted  . MDD (major depressive disorder), recurrent severe, without psychosis ([facility]) 04/19/2018  . Suicide attempt by benzodiazepine overdose ([facility]) 04/19/2018    Eulis Fosterheryl Gray, PT 12/29/18 3:33 PM   [facility] [address], [address] [place], [place], [address] Phone: [phone]   Fax:  [phone]  Name: Alexis GeneCasey M Wanke MRN: [id number] Date of Birth: 06/06/2000

## 2018-12-29 NOTE — Patient Instructions (Signed)
STRETCHING THE PELVIC FLOOR MUSCLES NO DILATOR  Supplies . Vaginal lubricant . Mirror (optional) . Gloves (optional) Positioning . Start in a semi-reclined position with your head propped up. Bend your knees and place your thumb or finger at the vaginal opening. Procedure . Apply a moderate amount of lubricant on the outer skin of your vagina, the labia minora.  Apply additional lubricant to your finger. . Spread the skin away from the vaginal opening. Place the end of your finger at the opening. . Do a maximum contraction of the pelvic floor muscles. Tighten the vagina and the anus maximally and relax. . When you know they are relaxed, gently and slowly insert your finger into your vagina, directing your finger slightly downward, for 2-3 inches of insertion. . Relax and stretch the 6 o'clock position . Hold each stretch for _2 min__ and repeat __1_ time with rest breaks of _1__ seconds between each stretch. . Repeat the stretching in the 4 o'clock and 8 o'clock positions. . Total time should be _6__ minutes, _1__ x per day.  Note the amount of theme your were able to achieve and your tolerance to your finger in your vagina. . Once you have accomplished the techniques you may try them in standing with one foot resting on the tub, or in other positions.  This is a good stretch to do in the shower if you don't need to use lubricant.   [facility] [address], [address] [place], [place] [address] Phone # [phone] Fax [phone]  

## 2019-01-07 ENCOUNTER — Other Ambulatory Visit: Payer: Self-pay

## 2019-01-07 ENCOUNTER — Ambulatory Visit: Payer: Medicaid Other | Admitting: Physical Therapy

## 2019-01-07 ENCOUNTER — Encounter: Payer: Self-pay | Admitting: Physical Therapy

## 2019-01-07 DIAGNOSIS — M6281 Muscle weakness (generalized): Secondary | ICD-10-CM

## 2019-01-07 DIAGNOSIS — R293 Abnormal posture: Secondary | ICD-10-CM

## 2019-01-07 DIAGNOSIS — R252 Cramp and spasm: Secondary | ICD-10-CM

## 2019-01-07 NOTE — Therapy (Signed)
[facility] [address], [address] [place], [place], [address] Phone: [phone]   Fax:  [phone]  Physical Therapy Treatment  Patient Details  Name: Alexis [name] MRN: [id number] Date of Birth: 12/01/2000 Referring Provider (PT): Burman FosterBree Tharpe Fleck NP   Encounter Date: 01/07/2019  PT End of Session - 01/07/19 1239    Visit Number  26    Date for PT Re-Evaluation  03/09/19    Authorization Type  Medicaid    Authorization Time Period  12/29/2018-03/22/2019    Authorization - Visit Number  2    Authorization - Number of Visits  12    PT Start Time  1230    PT Stop Time  1315    PT Time Calculation (min)  45 min    Activity Tolerance  Patient tolerated treatment well;No increased pain    Behavior During Therapy  WFL for tasks assessed/performed       Past Medical History:  Diagnosis Date  . Anxiety   . Asthma   . Depression   . GERD (gastroesophageal reflux disease)   . Migraines     Past Surgical History:  Procedure Laterality Date  . CYSTO WITH HYDRODISTENSION N/A 04/10/2018   Procedure: CYSTOSCOPY/HYDRODISTENSION;  Surgeon: Crist FatHerrick, Benjamin W, MD;  [facility] [place];  Service: Urology;  Laterality: N/A;  . UPPER GI ENDOSCOPY      There were no vitals filed for this visit.  Subjective Assessment - 01/07/19 1235    Subjective  I have been in bed alot and not eating due to the pain.  I was feeling bad so I went to the doctor for a bladder instillation. It helped a bit but still in a flare-up. I am buying the wand today to help and I have been using the coconut oil after urination. The home internal work helps. I did not have a UTI.    Patient Stated Goals  reduce pain    Currently in Pain?  Yes    Pain Score  7     Pain Location  Vagina    Pain Orientation  Mid    Pain Descriptors / Indicators  Aching;Burning    Pain Type  Chronic pain    Pain Onset  More than a month ago    Pain Frequency   Constant    Aggravating Factors   with urination, penile penetration, stress, just before her period, sitting for more than 30 minutes in a car or uncomfortable chair, wearing tight jeans    Pain Relieving Factors  following a certain diet, internal soft tissue work, coconut oit    Multiple Pain Sites  No                    Pelvic Floor Special Questions - 01/07/19 0001    Pelvic Floor Internal Exam  Patient confirmed identication and approves PT to assess muscle and treatment    Exam Type  Vaginal        [facility] Adult PT Treatment/Exercise - 01/07/19 0001      Lumbar Exercises: Stretches   Quadruped Mid Back Stretch  3 reps;30 seconds    Quadruped Mid Back Stretch Limitations  all 3 directions      Lumbar Exercises: Aerobic   Elliptical  level 1 for 5 minutes      Manual Therapy   Manual Therapy  Soft tissue mobilization;Internal Pelvic Floor    Soft tissue mobilization  using an assistive device  to massage lumbar paraspinals    Internal Pelvic Floor  perineal body, bil. bulbocavernosus, ischiocavernosus elonagtion of tissue       Trigger Point Dry Needling - 01/07/19 0001    Consent Given?  Yes    Muscles Treated Back/Hip  Lumbar multifidi    Other Dry Needling  bil. bulbocavernsous, ishiocavernosus, perineal body    Lumbar multifidi Response  Twitch response elicited;Palpable increased muscle length             PT Short Term Goals - 01/08/18 1024      PT SHORT TERM GOAL #1   Title  independent with initial HEP    Time  4    Period  Weeks    Status  Achieved      PT SHORT TERM GOAL #2   Title  understand how to toilet correctly to reduce strain on the pelvic floor    Time  4    Period  Weeks    Status  Achieved        PT Long Term Goals - 01/07/19 1322      PT LONG TERM GOAL #1   Title  independent with HEP    Baseline  still learning as she progresses    Time  8    Period  Days    Status  On-going      PT LONG TERM GOAL #2   Title   reduce pain with vaginal penetration with exam and penile </= 3/10    Baseline  not able to due to pain level 10/10    Time  8    Period  Weeks    Status  On-going      PT LONG TERM GOAL #3   Title  burining with urination decreased >/= 75% due to ability to relax the pelvic floor    Baseline  flare- up so no change right now    Time  8    Period  Weeks    Status  On-going            Plan - 01/07/19 1314    Clinical Impression Statement  Patient is managing her pain with internal soft tissue work and stretches. Patient had an istilation that helped some. Patient is going to order her pelvic floor wand to make it easier to do pelvic floor massage. Patient is staying in bed more and not eating as much due to her pelvic pain. Patient tolerated the dry needling well. Patient is working on her posture to decrease pain. Patient is working on her endurane. Patient will benefit from skilled therapy to reduce trigger points in the pelvic floor to reduce her pain and improve her quality of life.    Rehab Potential  Excellent    Clinical Impairments Affecting Rehab Potential  none    PT Frequency  1x / week    PT Duration  12 weeks    PT Treatment/Interventions  Biofeedback;Cryotherapy;Electrical Stimulation;Moist Heat;Ultrasound;Therapeutic exercise;Therapeutic activities;Neuromuscular re-education;Patient/family education;Manual techniques;Dry needling    PT Next Visit Plan  core strength; review posture; HEP with yoga poses; vaginal tissue work; assess dry needling    PT Home Exercise Plan  ACCESS CODE: [facility]    Consulted and Agree with Plan of Care  Patient       Patient will benefit from skilled therapeutic intervention in order to improve the following deficits and impairments:  Increased fascial restricitons, Pain, Decreased mobility, Increased muscle spasms, Postural dysfunction, Decreased activity tolerance, Decreased range of  motion, Decreased strength  Visit Diagnosis: 1.  Muscle weakness (generalized)   2. Cramp and spasm   3. Abnormal posture        Problem List Patient Active Problem List   Diagnosis Date Noted  . MDD (major depressive disorder), recurrent severe, without psychosis ([place]) 04/19/2018  . Suicide attempt by benzodiazepine overdose ([place]) 04/19/2018    Earlie Counts, PT 01/07/19 1:24 PM   [facility] [address], [place], [place], [address] Phone: [phone]   Fax:  [phone]  Name: Alexis [name] MRN: [id number] Date of Birth: Feb 24, 2001

## 2019-01-14 ENCOUNTER — Ambulatory Visit: Payer: Medicaid Other | Admitting: Physical Therapy

## 2019-01-14 ENCOUNTER — Encounter: Payer: Self-pay | Admitting: Physical Therapy

## 2019-01-14 ENCOUNTER — Other Ambulatory Visit: Payer: Self-pay

## 2019-01-14 DIAGNOSIS — R252 Cramp and spasm: Secondary | ICD-10-CM

## 2019-01-14 DIAGNOSIS — M6281 Muscle weakness (generalized): Secondary | ICD-10-CM

## 2019-01-14 DIAGNOSIS — R293 Abnormal posture: Secondary | ICD-10-CM

## 2019-01-14 NOTE — Addendum Note (Signed)
Addended by: Earlie Counts F on: 01/14/2019 02:52 PM   Modules accepted: Orders

## 2019-01-14 NOTE — Therapy (Signed)
[facility] [address] [place], [place], [address] Phone: [phone]   Fax:  [phone]  Physical Therapy Treatment  Patient Details  Name: Alexis [name] MRN: [id number] Date of Birth: 2000/12/23 Referring Provider (PT): Jed Limerick NP   Encounter Date: 01/14/2019  PT End of Session - 01/14/19 5366    Visit Number  26    Date for PT Re-Evaluation  03/09/19    Authorization Type  Medicaid    Authorization Time Period  12/29/2018-03/22/2019    Authorization - Visit Number  3    Authorization - Number of Visits  12    PT Start Time  1400    PT Stop Time  1450    PT Time Calculation (min)  50 min    Activity Tolerance  Patient tolerated treatment well;No increased pain    Behavior During Therapy  WFL for tasks assessed/performed       Past Medical History:  Diagnosis Date  . Anxiety   . Asthma   . Depression   . GERD (gastroesophageal reflux disease)   . Migraines     Past Surgical History:  Procedure Laterality Date  . CYSTO WITH HYDRODISTENSION N/A 04/10/2018   Procedure: CYSTOSCOPY/HYDRODISTENSION;  Surgeon: Ardis Hughs, MD;  Location: [facility];  Service: Urology;  Laterality: N/A;  . UPPER GI ENDOSCOPY      There were no vitals filed for this visit.  Subjective Assessment - 01/14/19 1402    Subjective  I ordered the pelvic floor wand. I have been rolling up a towel like a foam roll to use at home. I have a bad pain day. I can barely  get up for work or school due to the pain. Hard to sleep on side due to pain and makes her feel like she has to go more.    Patient Stated Goals  reduce pain    Currently in Pain?  Yes    Pain Score  10-Worst pain ever    Pain Location  Pelvis    Pain Orientation  Mid    Pain Descriptors / Indicators  Burning;Pressure    Pain Type  Chronic pain    Pain Onset  More than a month ago    Pain Frequency  Constant    Aggravating Factors    with urination, penile penetration, stress, just before her period, sitting for more than 30 minutes in a car or uncomfortable chair, wearing tight jeans    Pain Relieving Factors  following a certain diet, internal soft tissue work, coconut oil    Multiple Pain Sites  No                    Pelvic Floor Special Questions - 01/14/19 0001    Pelvic Floor Internal Exam  Patient confirmed identication and approves PT to assess muscle and treatment    Exam Type  Vaginal        [facility] Adult PT Treatment/Exercise - 01/14/19 0001      Modalities   Modalities  Electrical Stimulation;Moist Heat      Moist Heat Therapy   Number Minutes Moist Heat  15 Minutes    Moist Heat Location  Other (comment)   abdominal and pelvic floor     Electrical Stimulation   Electrical Stimulation Location  lower abdominal and bil. hip adductors    Electrical Stimulation Action  IFC    Electrical Stimulation Parameters  to patient tolerance,  15 miutes    Electrical Stimulation Goals  Pain      Manual Therapy   Manual Therapy  Internal Pelvic Floor    Internal Pelvic Floor  bil. obturator internist, perineal body, levator ani, and urethra sphincter               PT Short Term Goals - 01/08/18 1024      PT SHORT TERM GOAL #1   Title  independent with initial HEP    Time  4    Period  Weeks    Status  Achieved      PT SHORT TERM GOAL #2   Title  understand how to toilet correctly to reduce strain on the pelvic floor    Time  4    Period  Weeks    Status  Achieved        PT Long Term Goals - 01/07/19 1322      PT LONG TERM GOAL #1   Title  independent with HEP    Baseline  still learning as she progresses    Time  8    Period  Days    Status  On-going      PT LONG TERM GOAL #2   Title  reduce pain with vaginal penetration with exam and penile </= 3/10    Baseline  not able to due to pain level 10/10    Time  8    Period  Weeks    Status  On-going      PT LONG TERM  GOAL #3   Title  burining with urination decreased >/= 75% due to ability to relax the pelvic floor    Baseline  flare- up so no change right now    Time  8    Period  Weeks    Status  On-going            Plan - 01/14/19 1437    Clinical Impression Statement  Patient is complaining of 10/10 pain so treatment today is focus on pain relief. Patient is having difficuly with getting to class or work due to the pain level. Patient pelvic floor had many trigger points and is better after manual work. Patient has not met goals due to being in such pain. Today we tried electrical stimulation to see if this will help with pain relief. Patient has ordered her pelvic wand and waiting for it to get to her. Patient will benefit from skilled therapy to reduce trigger points in the pelvic floor to reduce her pain and improve her quality of life.    Rehab Potential  Excellent    Clinical Impairments Affecting Rehab Potential  none    PT Frequency  1x / week    PT Duration  12 weeks    PT Treatment/Interventions  Biofeedback;Cryotherapy;Electrical Stimulation;Moist Heat;Ultrasound;Therapeutic exercise;Therapeutic activities;Neuromuscular re-education;Patient/family education;Manual techniques;Dry needling    PT Next Visit Plan  core strength; review posture; HEP with yoga poses; vaginal tissue work; assess dry needling; see if electrical stimulation has helped    PT Home Exercise Plan  ACCESS CODE: [facility]    Consulted and Agree with Plan of Care  Patient       Patient will benefit from skilled therapeutic intervention in order to improve the following deficits and impairments:     Visit Diagnosis: Muscle weakness (generalized)  Cramp and spasm  Abnormal posture     Problem List Patient Active Problem List   Diagnosis Date Noted  . MDD (  major depressive disorder), recurrent severe, without psychosis ([place]) 04/19/2018  . Suicide attempt by benzodiazepine overdose ([place]) 04/19/2018     Earlie Counts, PT 01/14/19 2:42 PM   [facility] [address], [place], [place], [address] Phone: [phone]   Fax:  [phone]  Name: Alexis [name] MRN: [id number] Date of Birth: 07-May-2001

## 2019-01-21 ENCOUNTER — Ambulatory Visit: Payer: Medicaid Other | Admitting: Physical Therapy

## 2019-01-21 ENCOUNTER — Encounter: Payer: Self-pay | Admitting: Physical Therapy

## 2019-01-21 ENCOUNTER — Other Ambulatory Visit: Payer: Self-pay

## 2019-01-21 DIAGNOSIS — M6281 Muscle weakness (generalized): Secondary | ICD-10-CM | POA: Diagnosis not present

## 2019-01-21 DIAGNOSIS — R252 Cramp and spasm: Secondary | ICD-10-CM

## 2019-01-21 DIAGNOSIS — R293 Abnormal posture: Secondary | ICD-10-CM

## 2019-01-21 NOTE — Therapy (Signed)
[facility] [address], [address] [place], [place], [address] Phone: [phone]   Fax:  [phone]  Physical Therapy Treatment  Patient Details  Name: Alexis [name] MRN: [id number] Date of Birth: [birth year] Referring Provider (PT): Burman Foster NP   Encounter Date: 01/21/2019  PT End of Session - 01/21/19 1525    Visit Number  27    Date for PT Re-Evaluation  03/09/19    Authorization Type  Medicaid    Authorization Time Period  12/29/2018-03/22/2019    Authorization - Visit Number  4    Authorization - Number of Visits  12    PT Start Time  1445    PT Stop Time  1523    PT Time Calculation (min)  38 min    Activity Tolerance  Patient tolerated treatment well;No increased pain    Behavior During Therapy  WFL for tasks assessed/performed       Past Medical History:  Diagnosis Date  . Anxiety   . Asthma   . Depression   . GERD (gastroesophageal reflux disease)   . Migraines     Past Surgical History:  Procedure Laterality Date  . CYSTO WITH HYDRODISTENSION N/A 04/10/2018   Procedure: CYSTOSCOPY/HYDRODISTENSION;  Surgeon: Crist Fat, MD;  Location: [facility];  Service: Urology;  Laterality: N/A;  . UPPER GI ENDOSCOPY      There were no vitals filed for this visit.  Subjective Assessment - 01/21/19 1447    Subjective  I have a UTI and taking medication.    Patient Stated Goals  reduce pain    Currently in Pain?  Yes    Pain Score  10-Worst pain ever    Pain Location  Pelvis    Pain Orientation  Mid    Pain Descriptors / Indicators  Burning;Pressure    Pain Type  Chronic pain    Pain Onset  More than a month ago    Pain Frequency  Constant    Aggravating Factors   with urination, penile penetration, stress, just before her period, sitting for more than 30 minutes in car or uncomfortable chair, wearing tight jeans    Pain Relieving Factors  following a certain diet, internal soft  tissue work, coconut oil    Multiple Pain Sites  No                       [facility] Adult PT Treatment/Exercise - 01/21/19 0001      Neuro Re-ed    Neuro Re-ed Details   body scan of the body while reading instruction to patient for 15 min.       Exercises   Exercises  Other Exercises    Other Exercises   using a ball to massage the calf and thighs to massage the muscles, roll foot on ball to relax the pelvic floor       Lumbar Exercises: Stretches   Active Hamstring Stretch  Right;Left;1 rep;30 seconds    Active Hamstring Stretch Limitations  using foam roll    ITB Stretch  Right;Left;1 rep;30 seconds    ITB Stretch Limitations  using foam roll    Piriformis Stretch  Right;Left;1 rep;30 seconds   rolling on foam roll   Other Lumbar Stretch Exercise  lay on foam roll with butterfly stretch    Other Lumbar Stretch Exercise  lay on foam roll and arch thoraicc spine  on roll for mobilzation,  Lumbar Exercises: Supine   Other Supine Lumbar Exercises  lay on foam roll to decompress, alternate shoulder flexion then bil. shoulder flexion,              PT Education - 01/21/19 1523    Education Details  educated patient on how to use a foam roller to massage her muscles, how to use a tennis ball to massage her feet, calf, and hamstring; body scaning to release muscles    Person(s) Educated  Patient    Methods  Explanation;Demonstration;Handout    Comprehension  Verbalized understanding;Returned demonstration       PT Short Term Goals - 01/08/18 1024      PT SHORT TERM GOAL #1   Title  independent with initial HEP    Time  4    Period  Weeks    Status  Achieved      PT SHORT TERM GOAL #2   Title  understand how to toilet correctly to reduce strain on the pelvic floor    Time  4    Period  Weeks    Status  Achieved        PT Long Term Goals - 01/21/19 1529      PT LONG TERM GOAL #1   Title  independent with HEP    Baseline  still learning as she  progresses    Time  8    Period  Days    Status  On-going      PT LONG TERM GOAL #2   Title  reduce pain with vaginal penetration with exam and penile </= 3/10    Time  8    Period  Weeks    Status  On-going      PT LONG TERM GOAL #3   Title  burining with urination decreased >/= 75% due to ability to relax the pelvic floor    Time  8    Period  Weeks    Status  On-going            Plan - 01/21/19 1526    Clinical Impression Statement  Patient found out she has a UTI today and is picking up her medication today. Patient feels she has had the UTI for 1 month. Patient has learned today ways to massage her body to reduce her pelvic pain. Patient has learned body scanning to reduce muscle tension in her body. Patient will benefit from skilled therapy to reduce trigger points in the pelvic floor to reduce her pain and improve her quality of life.    Clinical Impairments Affecting Rehab Potential  none    PT Frequency  1x / week    PT Duration  12 weeks    PT Treatment/Interventions  Biofeedback;Cryotherapy;Electrical Stimulation;Moist Heat;Ultrasound;Therapeutic exercise;Therapeutic activities;Neuromuscular re-education;Patient/family education;Manual techniques;Dry needling    PT Next Visit Plan  core strength; review posture; HEP with yoga poses; vaginal tissue work; assess dry needling; see if electrical stimulation has helped    PT Home Exercise Plan  ACCESS CODE: [email or web address]    Consulted and Agree with Plan of Care  Patient       Patient will benefit from skilled therapeutic intervention in order to improve the following deficits and impairments:  Increased fascial restricitons, Pain, Decreased mobility, Increased muscle spasms, Postural dysfunction, Decreased activity tolerance, Decreased range of motion, Decreased strength  Visit Diagnosis: Muscle weakness (generalized)  Cramp and spasm  Abnormal posture     Problem List Patient Active Problem List   Diagnosis  Date  Noted  . MDD (major depressive disorder), recurrent severe, without psychosis ([place]) 04/19/2018  . Suicide attempt by benzodiazepine overdose ([place]) 04/19/2018    Earlie Counts, PT 01/21/19 3:30 PM   [facility] [address], [place], [place], [address] Phone: [phone]   Fax:  [phone]  Name: Alexis [name] MRN: [id number] Date of Birth: 01/31/2001

## 2019-01-28 ENCOUNTER — Encounter: Payer: Self-pay | Admitting: Physical Therapy

## 2019-01-28 ENCOUNTER — Other Ambulatory Visit: Payer: Self-pay

## 2019-01-28 ENCOUNTER — Ambulatory Visit: Payer: Medicaid Other | Attending: Urology | Admitting: Physical Therapy

## 2019-01-28 DIAGNOSIS — M6281 Muscle weakness (generalized): Secondary | ICD-10-CM | POA: Insufficient documentation

## 2019-01-28 DIAGNOSIS — R252 Cramp and spasm: Secondary | ICD-10-CM | POA: Insufficient documentation

## 2019-01-28 DIAGNOSIS — R293 Abnormal posture: Secondary | ICD-10-CM | POA: Diagnosis present

## 2019-01-28 NOTE — Therapy (Signed)
[facility] [address], [address] [place], [place], [address] Phone: [phone]   Fax:  [phone]  Physical Therapy Treatment  Patient Details  Name: Alexis [name] MRN: [id number] Date of Birth: 04/22/2001 Referring Provider (PT): Burman FosterBree Tharpe Fleck NP   Encounter Date: 01/28/2019  PT End of Session - 01/28/19 1520    Visit Number  28    Date for PT Re-Evaluation  03/09/19    Authorization Type  Medicaid    Authorization Time Period  12/29/2018-03/22/2019    Authorization - Visit Number  5    Authorization - Number of Visits  12    PT Start Time  1445    PT Stop Time  1535    PT Time Calculation (min)  50 min    Activity Tolerance  Patient tolerated treatment well;No increased pain    Behavior During Therapy  WFL for tasks assessed/performed       Past Medical History:  Diagnosis Date  . Anxiety   . Asthma   . Depression   . GERD (gastroesophageal reflux disease)   . Migraines     Past Surgical History:  Procedure Laterality Date  . CYSTO WITH HYDRODISTENSION N/A 04/10/2018   Procedure: CYSTOSCOPY/HYDRODISTENSION;  Surgeon: Crist FatHerrick, Benjamin W, MD;  [facility];  Service: Urology;  Laterality: N/A;  . UPPER GI ENDOSCOPY      There were no vitals filed for this visit.  Subjective Assessment - 01/28/19 1448    Subjective  I have not had urgency since taking the antibiotics. The doctor said I did not have an infection and to finish antibiotics. I had a bladder instilation on Wednesday and it failed. The liquid came out due to my bladder spasms. they want me to do the intillation 2 times per week. I am on my cycle today.    Patient Stated Goals  reduce pain    Currently in Pain?  Yes    Pain Location  Pelvis    Pain Orientation  Mid    Pain Descriptors / Indicators  Burning    Pain Type  Chronic pain    Pain Onset  More than a month ago    Pain Frequency  Constant    Aggravating Factors    with urination, penile penetration, stress, just before her period, sitting for more than 30 minutes in car or uncomfortable chair, wearing tight jeans    Pain Relieving Factors  following a certian diet, internal soft tissue work, coconut oil    Multiple Pain Sites  No                       [facility] Adult PT Treatment/Exercise - 01/28/19 0001      Lumbar Exercises: Supine   Clam  10 reps;1 second    Other Supine Lumbar Exercises  lay on foam roll to decompress, alternate shoulder flexion then bil. shoulder flexion, horizontal shoulder abduction with red band; then diagonals with red band, hold red band and make Y form    Other Supine Lumbar Exercises  lay with foam roll across the spine and extend the spine      Lumbar Exercises: Quadruped   Opposite Arm/Leg Raise  Right arm/Left leg;Left arm/Right leg;15 reps;1 second      Modalities   Modalities  Electrical Stimulation;Moist Heat      Moist Heat Therapy   Number Minutes Moist Heat  15 Minutes    Moist Heat  Location  --   lower abdominal; sides of S2     Electrical Stimulation   Electrical Stimulation Location  lower abdominal and bil. hip adductors    Electrical Stimulation Action  IFC    Electrical Stimulation Parameters  to patient tolerance; 15 mn; to patient tolerance    Electrical Stimulation Goals  Pain               PT Short Term Goals - 01/08/18 1024      PT SHORT TERM GOAL #1   Title  independent with initial HEP    Time  4    Period  Weeks    Status  Achieved      PT SHORT TERM GOAL #2   Title  understand how to toilet correctly to reduce strain on the pelvic floor    Time  4    Period  Weeks    Status  Achieved        PT Long Term Goals - 01/28/19 1527      PT LONG TERM GOAL #1   Title  independent with HEP    Time  8    Period  Days    Status  On-going      PT LONG TERM GOAL #2   Title  reduce pain with vaginal penetration with exam and penile </= 3/10    Time  8     Period  Weeks    Status  On-going      PT LONG TERM GOAL #3   Title  burining with urination decreased >/= 75% due to ability to relax the pelvic floor    Baseline  flare- up so no change right now    Time  8    Period  Weeks    Status  On-going            Plan - 01/28/19 1521    Clinical Impression Statement  Pain after treatment decreased to 7/10. Patient does not have a UTI. She had a bladder instillation that did not stay in the bladder last Wednesday. Patient will be seeing her doctor next week to discuss her pain and the bladder instilation. Patient not able to have internal soft tissue work due to being on her cycle. Patient did exercise today without increase in pain and ended with interferential current. Patient will resume using her pelvic wand after her cycle. Patient was working on her back strength today with some increased body sway with quadruped left opposite extremity. Patient will benefit from skilled therapy to reduce trigger points in the pelvic floor to reduce her pain and improve her quality of life.    Rehab Potential  Excellent    Clinical Impairments Affecting Rehab Potential  none    PT Frequency  1x / week    PT Duration  12 weeks    PT Treatment/Interventions  Biofeedback;Cryotherapy;Electrical Stimulation;Moist Heat;Ultrasound;Therapeutic exercise;Therapeutic activities;Neuromuscular re-education;Patient/family education;Manual techniques;Dry needling    PT Next Visit Plan  core strength; review posture; HEP with yoga poses; vaginal tissue work; assess dry needling;  electrical stimulation    Consulted and Agree with Plan of Care  Patient       Patient will benefit from skilled therapeutic intervention in order to improve the following deficits and impairments:  Increased fascial restricitons, Pain, Decreased mobility, Increased muscle spasms, Postural dysfunction, Decreased activity tolerance, Decreased range of motion, Decreased strength  Visit  Diagnosis: Muscle weakness (generalized)  Cramp and spasm  Abnormal posture  Problem List Patient Active Problem List   Diagnosis Date Noted  . MDD (major depressive disorder), recurrent severe, without psychosis ([place]) 04/19/2018  . Suicide attempt by benzodiazepine overdose ([place]) 04/19/2018    Earlie Counts, PT 01/28/19 3:33 PM   [facility] [address] [place], [place], [address] Phone: [phone]   Fax:  [phone]  Name: Alexis [name] MRN: [id number] Date of Birth: December 07, 2000

## 2019-02-04 ENCOUNTER — Other Ambulatory Visit: Payer: Self-pay

## 2019-02-04 ENCOUNTER — Ambulatory Visit: Payer: Medicaid Other | Admitting: Physical Therapy

## 2019-02-04 ENCOUNTER — Encounter: Payer: Self-pay | Admitting: Physical Therapy

## 2019-02-04 DIAGNOSIS — R252 Cramp and spasm: Secondary | ICD-10-CM

## 2019-02-04 DIAGNOSIS — M6281 Muscle weakness (generalized): Secondary | ICD-10-CM

## 2019-02-04 DIAGNOSIS — R293 Abnormal posture: Secondary | ICD-10-CM

## 2019-02-04 NOTE — Therapy (Signed)
[facility] [address], [place], [place], [address] Phone: [phone]   Fax:  [phone]  Physical Therapy Treatment  Patient Details  Name: Alexis [name] MRN: [id number] Date of Birth: Sep 16, 2000 Referring Provider (PT): Jed Limerick NP   Encounter Date: 02/04/2019  PT End of Session - 02/04/19 1540    Visit Number  29    Date for PT Re-Evaluation  03/09/19    Authorization Type  Medicaid    Authorization Time Period  12/29/2018-03/22/2019    Authorization - Visit Number  6    Authorization - Number of Visits  12    PT Start Time  3614    PT Stop Time  1525    PT Time Calculation (min)  40 min    Activity Tolerance  Patient tolerated treatment well;No increased pain    Behavior During Therapy  WFL for tasks assessed/performed       Past Medical History:  Diagnosis Date  . Anxiety   . Asthma   . Depression   . GERD (gastroesophageal reflux disease)   . Migraines     Past Surgical History:  Procedure Laterality Date  . CYSTO WITH HYDRODISTENSION N/A 04/10/2018   Procedure: CYSTOSCOPY/HYDRODISTENSION;  Surgeon: Ardis Hughs, MD;  Location: [facility];  Service: Urology;  Laterality: N/A;  . UPPER GI ENDOSCOPY      There were no vitals filed for this visit.  Subjective Assessment - 02/04/19 1451    Subjective  I see the doctor tomorrow. I am feeling alot better. I broke my diet yesterday for my birthday and drank alot of water. I am asking him for muscle relaxer and medication for overactive bladder. I want to be referred to pain management for when I am in a bad flare. I am alright to do internal therapy. I just have urgency and very little burning. I am having to double void due to not emptying her bladder.    Patient Stated Goals  reduce pain    Currently in Pain?  Yes    Pain Score  6     Pain Location  Pelvis    Pain Orientation  Mid    Pain Descriptors / Indicators   Burning    Aggravating Factors   burning with urination, penile penetration, stress, sitting causes urgency and burning, wearing tight jeans,    Pain Relieving Factors  following diet, internal soft tissue work, coconut oil    Multiple Pain Sites  No                    Pelvic Floor Special Questions - 02/04/19 0001    Pelvic Floor Internal Exam  Patient confirmed identication and approves PT to assess muscle and treatment    Exam Type  Vaginal        [facility] Adult PT Treatment/Exercise - 02/04/19 0001      Manual Therapy   Manual Therapy  Internal Pelvic Floor    Internal Pelvic Floor  myofascial release on the sides of the bladder and bil. urethra sphincter to reduce burning and improve emptying her bladder, soft tissue work to bilateral levator ani and Astronomer with stroking methods to the muscles               PT Short Term Goals - 01/08/18 1024      PT SHORT TERM GOAL #1   Title  independent with initial HEP    Time  4    Period  Weeks    Status  Achieved      PT SHORT TERM GOAL #2   Title  understand how to toilet correctly to reduce strain on the pelvic floor    Time  4    Period  Weeks    Status  Achieved        PT Long Term Goals - 02/04/19 1544      PT LONG TERM GOAL #2   Title  reduce pain with vaginal penetration with exam and penile </= 3/10    Baseline  not able to due to pain level 10/10    Time  8    Period  Weeks    Status  On-going      PT LONG TERM GOAL #3   Title  burining with urination decreased >/= 75% due to ability to relax the pelvic floor    Baseline  flare- up so no change right now    Time  8    Period  Weeks    Status  On-going            Plan - 02/04/19 1541    Clinical Impression Statement  Patient is having trouble emptying her bladder. Patient was able to tolerate internal soft tissue work for the first time in 2 weeks. Patients pain level has decreased since last week and after manual work  decreased to 5/10. Patient has a vaginal wand for internal soft tissue work but only has used it once due to her having a flare-up. Patient will start tomorrow to use it. Patient had fascial releases in the pelvic floor muscles during soft tissue work. Patient will be seeing Dr. Marlou PorchHerrick tomorrow. Patient did not need the IFC today due to reduction of pain. Patient will benefit from skilled therapy to reduce trigger points in the pelvic floor to reduce her pain and improve her quality of life.    Rehab Potential  Excellent    Clinical Impairments Affecting Rehab Potential  none    PT Frequency  1x / week    PT Treatment/Interventions  Biofeedback;Cryotherapy;Electrical Stimulation;Moist Heat;Ultrasound;Therapeutic exercise;Therapeutic activities;Neuromuscular re-education;Patient/family education;Manual techniques;Dry needling    PT Next Visit Plan  core strength; review posture; HEP with yoga poses; vaginal tissue work    PT Home Exercise Plan  ACCESS [facility]: [facility]    Consulted and Agree with Plan of Care  Patient       Patient will benefit from skilled therapeutic intervention in order to improve the following deficits and impairments:  Increased fascial restricitons, Pain, Decreased mobility, Increased muscle spasms, Postural dysfunction, Decreased activity tolerance, Decreased range of motion, Decreased strength  Visit Diagnosis: Muscle weakness (generalized)  Cramp and spasm  Abnormal posture     Problem List Patient Active Problem List   Diagnosis Date Noted  . MDD (major depressive disorder), recurrent severe, without psychosis (HCC) 04/19/2018  . Suicide attempt by benzodiazepine overdose (HCC) 04/19/2018    Eulis Fosterheryl Christen Wardrop, PT 02/04/19 3:47 PM   [facility] [address], [address] [place], [place], [address] Phone: [phone]   Fax:  [phone]  Name: Ladoris GeneCasey M Schauer MRN: [id number] Date of Birth: 10/16/2000

## 2019-02-11 ENCOUNTER — Other Ambulatory Visit: Payer: Self-pay

## 2019-02-11 ENCOUNTER — Encounter: Payer: Self-pay | Admitting: Physical Therapy

## 2019-02-11 ENCOUNTER — Ambulatory Visit: Payer: Medicaid Other | Admitting: Physical Therapy

## 2019-02-11 DIAGNOSIS — M6281 Muscle weakness (generalized): Secondary | ICD-10-CM | POA: Diagnosis not present

## 2019-02-11 DIAGNOSIS — R293 Abnormal posture: Secondary | ICD-10-CM

## 2019-02-11 DIAGNOSIS — R252 Cramp and spasm: Secondary | ICD-10-CM

## 2019-02-11 NOTE — Therapy (Signed)
[facility] [address], [address] [place], [place], [address] Phone: [phone]   Fax:  [phone]  Physical Therapy Treatment  Patient Details  Name: Alexis GeneCasey M Song MRN: [id number] Date of Birth: 11/18/2000 Referring Provider (PT): Burman FosterBree Tharpe Fleck NP   Encounter Date: 02/11/2019  PT End of Session - 02/11/19 1527    Visit Number  30    Date for PT Re-Evaluation  03/09/19    Authorization Type  Medicaid    Authorization Time Period  12/29/2018-03/22/2019    Authorization - Visit Number  7    Authorization - Number of Visits  12    PT Start Time  1445    PT Stop Time  1527    PT Time Calculation (min)  42 min    Activity Tolerance  Patient tolerated treatment well;No increased pain    Behavior During Therapy  WFL for tasks assessed/performed       Past Medical History:  Diagnosis Date  . Anxiety   . Asthma   . Depression   . GERD (gastroesophageal reflux disease)   . Migraines     Past Surgical History:  Procedure Laterality Date  . CYSTO WITH HYDRODISTENSION N/A 04/10/2018   Procedure: CYSTOSCOPY/HYDRODISTENSION;  Surgeon: Crist FatHerrick, Benjamin W, MD;  [facility];  Service: Urology;  Laterality: N/A;  . UPPER GI ENDOSCOPY      There were no vitals filed for this visit.  Subjective Assessment - 02/11/19 1448    Subjective  I am not in too much pain. I have been too tired to do the internal soft tissue work due to having exams. I saw my doctor.He wants me to continue PT. MD feels the soft tissue work on the wand helps.I am taking a medication to reduce the bladder spasms.    Patient Stated Goals  reduce pain    Currently in Pain?  Yes    Pain Score  6     Pain Location  Pelvis    Pain Orientation  Mid    Pain Descriptors / Indicators  Burning    Pain Type  Chronic pain    Pain Onset  More than a month ago    Pain Frequency  Constant    Aggravating Factors   burning with urination,  penile penetration, stress, sitting causes urgency and burning,wearing tight jeans    Pain Relieving Factors  following diet, internal soft tissue work, coconut oil                       [facility] Adult PT Treatment/Exercise - 02/11/19 0001      Lumbar Exercises: Supine   Bent Knee Raise  20 reps   each side with core bracing   Bridge  15 reps;5 seconds    Single Leg Bridge  10 reps   each side     Lumbar Exercises: Prone   Single Arm Raise  Right;Left;10 reps;1 second   trouble to do full range   Straight Leg Raise  10 reps   on each leg alternating     Lumbar Exercises: Quadruped   Opposite Arm/Leg Raise  Right arm/Left leg;Left arm/Right leg;15 reps   VC to keep spine flat   Opposite Arm/Leg Raise Limitations  increased trunk sway and decreased control      Manual Therapy   Manual Therapy  Internal Pelvic Floor    Internal Pelvic Floor  soft tissue work to the levator ani  bil., left piriformis, left coccygeus, left obturaotr internist with left hip movement             PT Education - 02/11/19 1525    Education Details  encouraged patient to resume her exercises and using the pelvic wand    Person(s) Educated  Patient    Methods  Explanation;Demonstration;Verbal cues;Handout    Comprehension  Verbalized understanding;Returned demonstration       PT Short Term Goals - 01/08/18 1024      PT SHORT TERM GOAL #1   Title  independent with initial HEP    Time  4    Period  Weeks    Status  Achieved      PT SHORT TERM GOAL #2   Title  understand how to toilet correctly to reduce strain on the pelvic floor    Time  4    Period  Weeks    Status  Achieved        PT Long Term Goals - 02/11/19 1530      PT LONG TERM GOAL #1   Title  independent with HEP    Baseline  still learning as she progresses    Time  8    Period  Days    Status  On-going      PT LONG TERM GOAL #2   Title  reduce pain with vaginal penetration with exam and penile </= 3/10     Baseline  not able to due to pain level 6/10    Time  8    Period  Weeks    Status  On-going      PT LONG TERM GOAL #3   Title  burining with urination decreased >/= 75% due to ability to relax the pelvic floor    Time  8    Period  Weeks    Status  On-going      PT LONG TERM GOAL #4   Title  reduce straining with bowel movement >/= 75% due to relaxation of pelvic floor muscles    Time  8    Period  Weeks    Status  Achieved      PT LONG TERM GOAL #5   Title  sitting for 30 minutes wiith pain level </3-4/10 due to decreased muscle tension     Baseline  pain 2/10    Time  12    Period  Weeks    Status  Achieved            Plan - 02/11/19 1527    Clinical Impression Statement  Patient has not been doing her exercises and using the pelvic floor wand to manage pain and improve her strength. Patient has decreased core strength with alternate shoulder and leg left. Patient has more trigger points in the left pelvic floor. Patient reports being fatiqued. Patient will benefit from skilled therapy to work on her HEP for management of pain.    Rehab Potential  Excellent    Clinical Impairments Affecting Rehab Potential  none    PT Frequency  1x / week    PT Duration  12 weeks    PT Treatment/Interventions  Biofeedback;Cryotherapy;Electrical Stimulation;Moist Heat;Ultrasound;Therapeutic exercise;Therapeutic activities;Neuromuscular re-education;Patient/family education;Manual techniques;Dry needling    PT Next Visit Plan  core strength; review posture;  vaginal tissue work    PT Home Exercise Plan  ACCESS CODE: [facility]    Consulted and Agree with Plan of Care  Patient       Patient will  benefit from skilled therapeutic intervention in order to improve the following deficits and impairments:  Increased fascial restricitons, Pain, Decreased mobility, Increased muscle spasms, Postural dysfunction, Decreased activity tolerance, Decreased range of motion, Decreased strength  Visit  Diagnosis: Muscle weakness (generalized)  Cramp and spasm  Abnormal posture     Problem List Patient Active Problem List   Diagnosis Date Noted  . MDD (major depressive disorder), recurrent severe, without psychosis ([place]) 04/19/2018  . Suicide attempt by benzodiazepine overdose ([place]) 04/19/2018    Earlie Counts, PT 02/11/19 3:31 PM   [facility] [address], [place], [place], [address] Phone: [phone]   Fax:  [phone]  Name: EMIYA LOOMER MRN: [id number] Date of Birth: June 15, 2000

## 2019-02-18 ENCOUNTER — Other Ambulatory Visit: Payer: Self-pay

## 2019-02-18 ENCOUNTER — Encounter: Payer: Self-pay | Admitting: Physical Therapy

## 2019-02-18 ENCOUNTER — Ambulatory Visit: Payer: Medicaid Other | Admitting: Physical Therapy

## 2019-02-18 DIAGNOSIS — R252 Cramp and spasm: Secondary | ICD-10-CM

## 2019-02-18 DIAGNOSIS — M6281 Muscle weakness (generalized): Secondary | ICD-10-CM

## 2019-02-18 DIAGNOSIS — R293 Abnormal posture: Secondary | ICD-10-CM

## 2019-02-18 NOTE — Therapy (Signed)
[facility] [address] [place], [place], [address] Phone: [phone]   Fax:  [phone]  Physical Therapy Treatment  Patient Details  Name: Alexis [name] MRN: [id number] Date of Birth: 2000-10-04 Referring Provider (PT): Jed Limerick NP   Encounter Date: 02/18/2019  PT End of Session - 02/18/19 1450    Visit Number  31    Date for PT Re-Evaluation  03/09/19    Authorization Type  Medicaid    Authorization Time Period  12/29/2018-03/22/2019    Authorization - Visit Number  8    Authorization - Number of Visits  12    PT Start Time  4709    PT Stop Time  1525    PT Time Calculation (min)  40 min    Activity Tolerance  Patient tolerated treatment well;No increased pain    Behavior During Therapy  WFL for tasks assessed/performed       Past Medical History:  Diagnosis Date  . Anxiety   . Asthma   . Depression   . GERD (gastroesophageal reflux disease)   . Migraines     Past Surgical History:  Procedure Laterality Date  . CYSTO WITH HYDRODISTENSION N/A 04/10/2018   Procedure: CYSTOSCOPY/HYDRODISTENSION;  Surgeon: Ardis Hughs, MD;  Location: [facility];  Service: Urology;  Laterality: N/A;  . UPPER GI ENDOSCOPY      There were no vitals filed for this visit.  Subjective Assessment - 02/18/19 1447    Subjective  still having urgency and frequency at night. The feeling is still there. When I do the wand daily I have more frequency.    Patient Stated Goals  reduce pain    Currently in Pain?  Yes    Pain Score  4    burning 1/10   Pain Location  Pelvis    Pain Orientation  Mid    Pain Descriptors / Indicators  Burning    Pain Type  Chronic pain    Pain Onset  More than a month ago    Pain Frequency  Constant    Aggravating Factors   burning with urination, penile penetration, stress, sitting causes urgency and burning, wearing tight jeans    Pain Relieving Factors   following diet, internal soft tissue work, coconut oil                    Pelvic Floor Special Questions - 02/18/19 0001    Pelvic Floor Internal Exam  Patient confirmed identication and approves PT to assess muscle and treatment    Exam Type  Vaginal        [facility] Adult PT Treatment/Exercise - 02/18/19 0001      Lumbar Exercises: Stretches   Press Ups  5 reps;10 reps   VC to relax buttocks and shoulders   Quad Stretch  Right;Left;1 rep;30 seconds   sidely with therapist assist   Piriformis Stretch  Right;Left;1 rep;30 seconds   pigeon pose   Other Lumbar Stretch Exercise  quadruped rocking with hips IR    Other Lumbar Stretch Exercise  hip adductors hold 30 sec right and left, sitting butterfly stretch      Lumbar Exercises: Supine   Bridge  10 reps;1 second    Bridge Limitations  then bridge with alternat heel raises      Lumbar Exercises: Prone   Other Prone Lumbar Exercises  bil shoulder horizontal abduction, bil. shoulder extension wiht tactile cues to squeeze shoulder  blades      Lumbar Exercises: Quadruped   Opposite Arm/Leg Raise  Right arm/Left leg;Left arm/Right leg;10 reps;1 second    Opposite Arm/Leg Raise Limitations  therapist giving tactile cues to keep spinal neutral      Manual Therapy   Manual Therapy  Internal Pelvic Floor    Internal Pelvic Floor  bil. levator ani an dobturator internist             PT Education - 02/18/19 1511    Education Details  Access Code: [facility]    Person(s) Educated  Patient    Methods  Explanation;Demonstration;Verbal cues;Handout    Comprehension  Returned demonstration;Verbalized understanding       PT Short Term Goals - 01/08/18 1024      PT SHORT TERM GOAL #1   Title  independent with initial HEP    Time  4    Period  Weeks    Status  Achieved      PT SHORT TERM GOAL #2   Title  understand how to toilet correctly to reduce strain on the pelvic floor    Time  4    Period  Weeks    Status   Achieved        PT Long Term Goals - 02/11/19 1530      PT LONG TERM GOAL #1   Title  independent with HEP    Baseline  still learning as she progresses    Time  8    Period  Days    Status  On-going      PT LONG TERM GOAL #2   Title  reduce pain with vaginal penetration with exam and penile </= 3/10    Baseline  not able to due to pain level 6/10    Time  8    Period  Weeks    Status  On-going      PT LONG TERM GOAL #3   Title  burining with urination decreased >/= 75% due to ability to relax the pelvic floor    Time  8    Period  Weeks    Status  On-going      PT LONG TERM GOAL #4   Title  reduce straining with bowel movement >/= 75% due to relaxation of pelvic floor muscles    Time  8    Period  Weeks    Status  Achieved      PT LONG TERM GOAL #5   Title  sitting for 30 minutes wiith pain level </3-4/10 due to decreased muscle tension     Baseline  pain 2/10    Time  12    Period  Weeks    Status  Achieved            Plan - 02/18/19 1450    Clinical Impression Statement  Pelvic floor muscles are tighter on the left compared to the right. Patient back muscles will fatique with exercises. Patient needs tactile cues to exercise her arms in prone due weakness. Patient has had reduction of pain over the past week. Patient needs to continue to work on strength of trunk to improve core stability. Patient is using her wand regualry to work on pelvic floor trigger points. Patient will benefit from skilled therapy to work on her HEP for management of pain.    Rehab Potential  Excellent    Clinical Impairments Affecting Rehab Potential  none    PT Frequency  1x / week  PT Duration  12 weeks    PT Treatment/Interventions  Biofeedback;Cryotherapy;Electrical Stimulation;Moist Heat;Ultrasound;Therapeutic exercise;Therapeutic activities;Neuromuscular re-education;Patient/family education;Manual techniques;Dry needling    PT Next Visit Plan  core strength;   vaginal tissue  work; pigeon pose    PT Home Exercise Plan  ACCESS CODE: [facility]    Consulted and Agree with Plan of Care  Patient       Patient will benefit from skilled therapeutic intervention in order to improve the following deficits and impairments:  Increased fascial restricitons, Pain, Decreased mobility, Increased muscle spasms, Postural dysfunction, Decreased activity tolerance, Decreased range of motion, Decreased strength  Visit Diagnosis: Muscle weakness (generalized)  Cramp and spasm  Abnormal posture     Problem List Patient Active Problem List   Diagnosis Date Noted  . MDD (major depressive disorder), recurrent severe, without psychosis (HCC) 04/19/2018  . Suicide attempt by benzodiazepine overdose (HCC) 04/19/2018    Eulis Fosterheryl Shine Mikes, PT 02/18/19 3:32 PM   [facility] [address], [address] [place], [place], [address] Phone: [phone]   Fax:  [phone]  Name: Alexis [name] MRN: [id number] Date of Birth: 06/22/2000

## 2019-02-18 NOTE — Patient Instructions (Signed)
Access Code: [facility]  URL: [email or web address]  Date: 02/18/2019  Prepared by: Earlie Counts   Exercises Seated Hamstring Stretch - 2 reps - 1 sets - 30 sec hold - 1x daily - 7x weekly Seated Piriformis Stretch with Trunk Bend - 2 reps - 1 sets - 30 sec hold - 1x daily - 7x weekly Cat Cow - 10 reps - 1 sets - 1x daily - 7x weekly Child's Pose Stretch - 1 reps - 1 sets - 30 sec hold - 1x daily - 7x weekly Child's Pose with Sidebending - 1 reps - 1 sets - 30 sec hold - 1x daily - 7x weekly Prone Press Up - 2 reps - 1 sets - 10 sec hold - 1x daily - 7x weekly V Sit Hip Adductor Hamstring Stretch - 2 reps - 1 sets - 30 sec hold - 1x daily - 7x weekly Supine Double Knee to Chest - 1 reps - 1 sets - 30 sec hold - 1x daily - 7x weekly Supine Lower Trunk Rotation - 2 reps - 1 sets - 30 sec hold - 1x daily - 7x weekly Supine Pelvic Floor Stretch - 1 reps - 1 sets - 1 min hold - 1x daily - 7x weekly Supine Diaphragmatic Breathing with Pelvic Floor Lengthening - 10 reps - 1 sets - 2x daily - 7x weekly Seated Diaphragmatic Breathing - 10 reps - 1 sets - 1x daily - 7x weekly Supine Shoulder Horizontal Abduction with Resistance - 10 reps - 1 sets - 1x daily - 7x weekly Sidelying Hip Abduction with Ankle Weight - 10 reps - 1 sets - 1x daily - 7x weekly Double Leg Hamstring Stretch at Wall - 1 reps - 1 sets - 35min hold                            - 1x daily - 7x weekly Supine Runner's Stretch at Wall - 1 reps - 1 sets - 1 min hold - 1x daily - 7x weekly Quadruped Pelvic Floor Contraction with Opposite Arm and Leg Lift - 10 reps - 1 sets - 1x daily - 7x weekly Supine Shoulder Horizontal Abduction with Resistance - 10 reps - 2 sets - 1x daily - 7x weekly Standing Shoulder Diagonal Horizontal Abduction 60/120 Degrees with Resistance - 10 reps - 1 sets - 1x daily - 7x weekly Dead Bug - 10 reps - 1 sets - 1x daily - 7x weekly Prone Shoulder Horizontal Abduction - 10 reps - 1 sets - 1x daily - 7x  weekly Prone Shoulder Extension - 10 reps - 31 sets - 1x daily - 7x weekly   [facility] [address] [place], [place] [address] Phone # [phone] Fax [phone]

## 2019-02-25 ENCOUNTER — Ambulatory Visit: Payer: Medicaid Other | Attending: Urology | Admitting: Physical Therapy

## 2019-02-25 ENCOUNTER — Encounter: Payer: Self-pay | Admitting: Physical Therapy

## 2019-02-25 ENCOUNTER — Other Ambulatory Visit: Payer: Self-pay

## 2019-02-25 DIAGNOSIS — M6281 Muscle weakness (generalized): Secondary | ICD-10-CM | POA: Diagnosis present

## 2019-02-25 DIAGNOSIS — R293 Abnormal posture: Secondary | ICD-10-CM | POA: Diagnosis present

## 2019-02-25 DIAGNOSIS — R252 Cramp and spasm: Secondary | ICD-10-CM | POA: Diagnosis present

## 2019-02-25 NOTE — Therapy (Signed)
[facility] [address] [place], [place], [address] Phone: [phone]   Fax:  [phone]  Physical Therapy Treatment  Patient Details  Name: Alexis [name] MRN: [id number] Date of Birth: 2000/12/14 Referring Provider (PT): Jed Limerick NP   Encounter Date: 02/25/2019  PT End of Session - 02/25/19 1451    Visit Number  32    Date for PT Re-Evaluation  03/09/19    Authorization Type  Medicaid    Authorization Time Period  12/29/2018-03/22/2019    Authorization - Visit Number  9    Authorization - Number of Visits  12    PT Start Time  3151    PT Stop Time  1525    PT Time Calculation (min)  40 min    Activity Tolerance  Patient tolerated treatment well;No increased pain    Behavior During Therapy  WFL for tasks assessed/performed       Past Medical History:  Diagnosis Date  . Anxiety   . Asthma   . Depression   . GERD (gastroesophageal reflux disease)   . Migraines     Past Surgical History:  Procedure Laterality Date  . CYSTO WITH HYDRODISTENSION N/A 04/10/2018   Procedure: CYSTOSCOPY/HYDRODISTENSION;  Surgeon: Ardis Hughs, MD;  Location: [facility];  Service: Urology;  Laterality: N/A;  . UPPER GI ENDOSCOPY      There were no vitals filed for this visit.  Subjective Assessment - 02/25/19 1449    Subjective  I have alot of pain with urgency today. I think I am about to start my period.    Patient Stated Goals  reduce pain    Currently in Pain?  Yes    Pain Score  8     Pain Location  Pelvis    Pain Orientation  Mid    Pain Descriptors / Indicators  Burning    Pain Type  Chronic pain    Pain Onset  More than a month ago    Pain Frequency  Constant    Aggravating Factors   burning with urination, penile penetration, stress, sitting causes urgency and burning, wearing tight jeans    Pain Relieving Factors  following diet, internal soft tissue work, coconut oil    Multiple Pain Sites  No                    Pelvic Floor Special Questions - 02/25/19 0001    Pelvic Floor Internal Exam  Patient confirmed identication and approves PT to assess muscle and treatment    Exam Type  Vaginal        [facility] Adult PT Treatment/Exercise - 02/25/19 0001      Lumbar Exercises: Stretches   Active Hamstring Stretch  Right;Left;1 rep;30 seconds   supine   Double Knee to Chest Stretch  1 rep;30 seconds    Lower Trunk Rotation  2 reps;30 seconds   each side   Press Ups  10 reps    Piriformis Stretch  Right;Left;1 rep;30 seconds   pigeon pose     Manual Therapy   Manual Therapy  Internal Pelvic Floor    Internal Pelvic Floor  bil. levator ani an dobturator internist               PT Short Term Goals - 01/08/18 1024      PT SHORT TERM GOAL #1   Title  independent with initial HEP    Time  4  Period  Weeks    Status  Achieved      PT SHORT TERM GOAL #2   Title  understand how to toilet correctly to reduce strain on the pelvic floor    Time  4    Period  Weeks    Status  Achieved        PT Long Term Goals - 02/25/19 1529      PT LONG TERM GOAL #1   Title  independent with HEP    Time  8    Period  Days    Status  On-going      PT LONG TERM GOAL #2   Title  reduce pain with vaginal penetration with exam and penile </= 3/10    Baseline  not able to due to pain level 6/10    Time  8    Period  Weeks    Status  On-going      PT LONG TERM GOAL #3   Title  burining with urination decreased >/= 75% due to ability to relax the pelvic floor    Baseline  flare- up so no change right now    Time  8    Period  Weeks    Status  On-going            Plan - 02/25/19 1526    Clinical Impression Statement  Patient is having pelvic pain and burining in the urethra at level 8/10. After therapy it decreased to 6/10. Patient had releases around the bladder with fascial work and relieved her pain. Patient has been very tired and  not able to do her exercises today. Patient will benefit from skilled therapy to work on pain management and manual work.    Rehab Potential  Excellent    Clinical Impairments Affecting Rehab Potential  none    PT Frequency  1x / week    PT Duration  12 weeks    PT Treatment/Interventions  Biofeedback;Cryotherapy;Electrical Stimulation;Moist Heat;Ultrasound;Therapeutic exercise;Therapeutic activities;Neuromuscular re-education;Patient/family education;Manual techniques;Dry needling    PT Next Visit Plan  core strength;   vaginal tissue work; pigeon pose    PT Home Exercise Plan  ACCESS CODE: [facility]    Consulted and Agree with Plan of Care  Patient       Patient will benefit from skilled therapeutic intervention in order to improve the following deficits and impairments:  Increased fascial restricitons, Pain, Decreased mobility, Increased muscle spasms, Postural dysfunction, Decreased activity tolerance, Decreased range of motion, Decreased strength  Visit Diagnosis: Muscle weakness (generalized)  Cramp and spasm  Abnormal posture     Problem List Patient Active Problem List   Diagnosis Date Noted  . MDD (major depressive disorder), recurrent severe, without psychosis (HCC) 04/19/2018  . Suicide attempt by benzodiazepine overdose (HCC) 04/19/2018    Eulis Foster, PT 02/25/19 3:30 PM   [facility] [address], [address] [place], [place], [address] Phone: [phone]   Fax:  [phone]  Name: Alexis [name] MRN: [id number] Date of Birth: [birth year]

## 2019-03-04 ENCOUNTER — Encounter: Payer: Self-pay | Admitting: Physical Therapy

## 2019-03-04 ENCOUNTER — Other Ambulatory Visit: Payer: Self-pay

## 2019-03-04 ENCOUNTER — Ambulatory Visit: Payer: Medicaid Other | Admitting: Physical Therapy

## 2019-03-04 DIAGNOSIS — M6281 Muscle weakness (generalized): Secondary | ICD-10-CM

## 2019-03-04 DIAGNOSIS — R252 Cramp and spasm: Secondary | ICD-10-CM

## 2019-03-04 DIAGNOSIS — R293 Abnormal posture: Secondary | ICD-10-CM

## 2019-03-04 NOTE — Therapy (Signed)
[facility] [address], [place], [place], [address] Phone: [phone]   Fax:  [phone]  Physical Therapy Treatment  Patient Details  Name: Alexis [name] MRN: [id number] Date of Birth: 2000/07/13 Referring Provider (PT): Jed Limerick NP   Encounter Date: 03/04/2019  PT End of Session - 03/04/19 1528    Visit Number  33    Date for PT Re-Evaluation  03/09/19    Authorization Type  Medicaid    Authorization Time Period  12/29/2018-03/22/2019    Authorization - Visit Number  10    Authorization - Number of Visits  12    PT Start Time  2992    PT Stop Time  1525    PT Time Calculation (min)  40 min    Activity Tolerance  Patient tolerated treatment well;No increased pain    Behavior During Therapy  WFL for tasks assessed/performed       Past Medical History:  Diagnosis Date  . Anxiety   . Asthma   . Depression   . GERD (gastroesophageal reflux disease)   . Migraines     Past Surgical History:  Procedure Laterality Date  . CYSTO WITH HYDRODISTENSION N/A 04/10/2018   Procedure: CYSTOSCOPY/HYDRODISTENSION;  Surgeon: Ardis Hughs, MD;  Location: [facility];  Service: Urology;  Laterality: N/A;  . UPPER GI ENDOSCOPY      There were no vitals filed for this visit.  Subjective Assessment - 03/04/19 1446    Subjective  I have problems with fully empting her bladder.  I still have the same urgency. I have trouble releasing the muscle prior to going to bed and in the AM. During the day I am able to fully empty her bladder. the bladder pain is 50% better. I get a spasm in the lower abdomen for 1 minute.    Patient Stated Goals  reduce pain    Currently in Pain?  Yes    Pain Score  5     Pain Location  Pelvis    Pain Orientation  Mid    Pain Descriptors / Indicators  Burning    Pain Type  Chronic pain    Pain Onset  More than a month ago    Pain Frequency  Constant    Aggravating  Factors   right before going to bed, off her diet, before, mid and after menstrual cycle, sitting for long period of time    Pain Relieving Factors  following diet, soak in baking soda water, heat, exericses help relax, hot baths    Multiple Pain Sites  No                    Pelvic Floor Special Questions - 03/04/19 0001    Pelvic Floor Internal Exam  Patient confirmed identication and approves PT to assess muscle and treatment    Exam Type  Vaginal        [facility] Adult PT Treatment/Exercise - 03/04/19 0001      Self-Care   Self-Care  Other Self-Care Comments    Other Self-Care Comments   discussed with patient on her pain management plan for discharge including stay active, stretching, meditation, using the pelvic wand daily, stay on diet, wear lose fitting clothes      Lumbar Exercises: Stretches   Active Hamstring Stretch  Right;Left;1 rep;60 seconds   supine   Active Hamstring Stretch Limitations  using the addaday on the muscle  Quadruped Mid Back Stretch  1 rep;60 seconds    Quadruped Mid Back Stretch Limitations  using addaday on the lumbar paraspinals    Piriformis Stretch  Right;Left;1 rep;60 seconds   pigeon pose   Piriformis Stretch Limitations  using the addaday on the muscles    Other Lumbar Stretch Exercise  hip adductor stretch using the addaday to the muscles      Manual Therapy   Manual Therapy  Internal Pelvic Floor    Internal Pelvic Floor  bil. levator ani an dobturator internist; fascial release around the bladder              PT Education - 03/04/19 1527    Education Details  discussed with patient on how to manage her pain at home    Person(s) Educated  Patient    Methods  Explanation;Demonstration;Verbal cues    Comprehension  Verbalized understanding;Returned demonstration       PT Short Term Goals - 01/08/18 1024      PT SHORT TERM GOAL #1   Title  independent with initial HEP    Time  4    Period  Weeks    Status   Achieved      PT SHORT TERM GOAL #2   Title  understand how to toilet correctly to reduce strain on the pelvic floor    Time  4    Period  Weeks    Status  Achieved        PT Long Term Goals - 02/25/19 1529      PT LONG TERM GOAL #1   Title  independent with HEP    Time  8    Period  Days    Status  On-going      PT LONG TERM GOAL #2   Title  reduce pain with vaginal penetration with exam and penile </= 3/10    Baseline  not able to due to pain level 6/10    Time  8    Period  Weeks    Status  On-going      PT LONG TERM GOAL #3   Title  burining with urination decreased >/= 75% due to ability to relax the pelvic floor    Baseline  flare- up so no change right now    Time  8    Period  Weeks    Status  On-going            Plan - 03/04/19 1529    Clinical Impression Statement  Patient pain level decreased to 4/10 after manual work. Patient has tight hip adductors and hamstring. Patient understands ways to manage her pain for when she is discharged in the next few visits. Patient has minimal burning now. Patient has trouble emptying her bladder at night but no trouble during the day. 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    Rehab Potential  Excellent    Clinical Impairments Affecting Rehab Potential  none    PT Frequency  1x / week    PT Duration  12 weeks    PT Treatment/Interventions  Biofeedback;Cryotherapy;Electrical Stimulation;Moist Heat;Ultrasound;Therapeutic exercise;Therapeutic activities;Neuromuscular re-education;Patient/family education;Manual techniques;Dry needling    PT Next Visit Plan  core strength;   vaginal tissue work; pigeon pose; how to fully empty her bladder, discharge i nnext 2 visits    PT Home Exercise Plan  [facility]    Consulted and Agree with Plan of Care  Patient  Patient will benefit from skilled therapeutic intervention in order to improve the following deficits and  impairments:  Increased fascial restricitons, Pain, Decreased mobility, Increased muscle spasms, Postural dysfunction, Decreased activity tolerance, Decreased range of motion, Decreased strength  Visit Diagnosis: Muscle weakness (generalized)  Cramp and spasm  Abnormal posture     Problem List Patient Active Problem List   Diagnosis Date Noted  . MDD (major depressive disorder), recurrent severe, without psychosis (HCC) 04/19/2018  . Suicide attempt by benzodiazepine overdose (HCC) 04/19/2018    Eulis Foster, PT 03/04/19 3:32 PM   [facility] [address] [place], [place], [address] Phone: [phone]   Fax:  [phone]  Name: Alexis [name] MRN: [id number] Date of Birth: 03/19/2001

## 2019-03-11 ENCOUNTER — Encounter: Payer: Self-pay | Admitting: Physical Therapy

## 2019-03-11 ENCOUNTER — Other Ambulatory Visit: Payer: Self-pay

## 2019-03-11 ENCOUNTER — Ambulatory Visit: Payer: Medicaid Other | Admitting: Physical Therapy

## 2019-03-11 DIAGNOSIS — M6281 Muscle weakness (generalized): Secondary | ICD-10-CM

## 2019-03-11 DIAGNOSIS — R252 Cramp and spasm: Secondary | ICD-10-CM

## 2019-03-11 DIAGNOSIS — R293 Abnormal posture: Secondary | ICD-10-CM

## 2019-03-11 NOTE — Therapy (Signed)
[facility] [address] [place], [place], [address] Phone: [phone]   Fax:  [phone]  Physical Therapy Treatment  Patient Details  Name: Alexis [name] MRN: [id number] Date of Birth: 10-05-2000 Referring Provider (PT): Jed Limerick NP   Encounter Date: 03/11/2019  PT End of Session - 03/11/19 1450    Visit Number  34    Date for PT Re-Evaluation  03/09/19    Authorization Type  Medicaid    Authorization Time Period  12/29/2018-03/22/2019    Authorization - Visit Number  11    Authorization - Number of Visits  12    PT Start Time  7989    PT Stop Time  1525    PT Time Calculation (min)  40 min    Activity Tolerance  Patient tolerated treatment well;No increased pain    Behavior During Therapy  WFL for tasks assessed/performed       Past Medical History:  Diagnosis Date  . Anxiety   . Asthma   . Depression   . GERD (gastroesophageal reflux disease)   . Migraines     Past Surgical History:  Procedure Laterality Date  . CYSTO WITH HYDRODISTENSION N/A 04/10/2018   Procedure: CYSTOSCOPY/HYDRODISTENSION;  Surgeon: Ardis Hughs, MD;  Location: [facility];  Service: Urology;  Laterality: N/A;  . UPPER GI ENDOSCOPY      There were no vitals filed for this visit.  Subjective Assessment - 03/11/19 1446    Subjective  I am hurting a little but not too bad, I used heat pad to help with the pain. Since  I have stopped the Toviaz and I am feeling better.    Patient Stated Goals  reduce pain    Currently in Pain?  Yes    Pain Score  4     Pain Location  Pelvis    Pain Orientation  Mid    Pain Descriptors / Indicators  Burning    Pain Type  Chronic pain    Pain Onset  More than a month ago    Pain Frequency  Constant    Aggravating Factors   right before going to bed, off her diet, before, mid and after menstrual cycle, sitting for long periof of time    Pain Relieving Factors   following diet, soak in baking soda water, heat, exercises help relax, hot baths    Multiple Pain Sites  No         [facility] PT Assessment - 03/11/19 0001      Assessment   Medical Diagnosis  N30.20 Chronic cystitis    Referring Provider (PT)  Valentino Nose Fredrich Romans NP    Onset Date/Surgical Date  01/25/17    Prior Therapy  none      Precautions   Precautions  None      Restrictions   Weight Bearing Restrictions  No      Home Environment   Living Environment  Private residence      Prior Function   Level of Independence  Independent    Vocation  Student    Leisure  none      Cognition   Overall Cognitive Status  Within Functional Limits for tasks assessed      Posture/Postural Control   Posture/Postural Control  Postural limitations    Postural Limitations  Rounded Shoulders;Forward head;Decreased thoracic kyphosis;Posterior pelvic tilt;Flexed trunk  Pelvic Floor Special Questions - 03/11/19 0001    Pelvic Floor Internal Exam  Patient confirmed identication and approves PT to assess muscle and treatment    Exam Type  Vaginal    Strength  good squeeze, good lift, able to hold agaisnt strong resistance        [facility] Adult PT Treatment/Exercise - 03/11/19 0001      Lumbar Exercises: Stretches   Press Ups  10 reps    Quadruped Mid Back Stretch  3 reps;30 seconds   all 3 ways   Piriformis Stretch  Right;Left;1 rep;30 seconds      Lumbar Exercises: Supine   Bent Knee Raise  10 reps   each leg with abdominal bracing   Bridge  10 reps    Bridge Limitations  VC to go slowly    Bridge with March  10 reps   alternating each side     Lumbar Exercises: Quadruped   Madcat/Old Horse  15 reps    Madcat/Old Horse Limitations  VC to go slowly    Opposite Arm/Leg Raise  Right arm/Left leg;Left arm/Right leg;1 second;15 reps      Manual Therapy   Manual Therapy  Internal Pelvic Floor    Internal Pelvic Floor  bil. levator ani an dobturator internist;  fascial release around the bladder , release around the perineal body, release on the left side of uterus               PT Short Term Goals - 01/08/18 1024      PT SHORT TERM GOAL #1   Title  independent with initial HEP    Time  4    Period  Weeks    Status  Achieved      PT SHORT TERM GOAL #2   Title  understand how to toilet correctly to reduce strain on the pelvic floor    Time  4    Period  Weeks    Status  Achieved        PT Long Term Goals - 03/11/19 1529      PT LONG TERM GOAL #1   Title  independent with HEP    Baseline  still learning as she progresses    Time  8    Period  Days    Status  On-going      PT LONG TERM GOAL #2   Title  reduce pain with vaginal penetration with exam and penile </= 3/10    Baseline  not able to due to pain level 6/10    Time  8    Period  Weeks    Status  On-going      PT LONG TERM GOAL #3   Title  burining with urination decreased >/= 75% due to ability to relax the pelvic floor    Period  Weeks    Status  On-going            Plan - 03/11/19 1451    Clinical Impression Statement  Patient is having less pain in the pelvic area. Patient has decreased tenderness located in the pelvic floor muscles. Patient is using her pelvic wand. Patient has had some restrictions in the left side of the urterus. Patient feels better after she exercises. Patient will benefit from skilled therapy to work on pain management and manual work.    Rehab Potential  Excellent    Clinical Impairments Affecting Rehab Potential  none    PT Frequency  1x /  week    PT Duration  12 weeks    PT Treatment/Interventions  Biofeedback;Cryotherapy;Electrical Stimulation;Moist Heat;Ultrasound;Therapeutic exercise;Therapeutic activities;Neuromuscular re-education;Patient/family education;Manual techniques;Dry needling    PT Next Visit Plan  review HEP, discharge next visit, internal soft tissue work    PT Home Exercise Plan  [facility]     Consulted and Agree with Plan of Care  Patient       Patient will benefit from skilled therapeutic intervention in order to improve the following deficits and impairments:  Increased fascial restricitons, Pain, Decreased mobility, Increased muscle spasms, Postural dysfunction, Decreased activity tolerance, Decreased range of motion, Decreased strength  Visit Diagnosis: Muscle weakness (generalized)  Cramp and spasm  Abnormal posture     Problem List Patient Active Problem List   Diagnosis Date Noted  . MDD (major depressive disorder), recurrent severe, without psychosis (HCC) 04/19/2018  . Suicide attempt by benzodiazepine overdose (HCC) 04/19/2018    Eulis Fosterheryl Jodye Scali, PT 03/11/19 3:31 PM   [facility] [address] [place], [place], [address] Phone: [phone]   Fax:  [phone]  Name: Alexis [name] MRN: [id number] Date of Birth: 03/10/2001

## 2019-03-18 ENCOUNTER — Ambulatory Visit: Payer: Medicaid Other | Admitting: Physical Therapy

## 2019-03-18 ENCOUNTER — Encounter: Payer: Self-pay | Admitting: Physical Therapy

## 2019-03-18 ENCOUNTER — Other Ambulatory Visit: Payer: Self-pay

## 2019-03-18 DIAGNOSIS — R293 Abnormal posture: Secondary | ICD-10-CM

## 2019-03-18 DIAGNOSIS — R252 Cramp and spasm: Secondary | ICD-10-CM

## 2019-03-18 DIAGNOSIS — M6281 Muscle weakness (generalized): Secondary | ICD-10-CM

## 2019-03-18 NOTE — Therapy (Signed)
[facility] [address], [place], [place], [address] Phone: [phone]   Fax:  [phone]  Physical Therapy Treatment  Patient Details  Name: Alexis [name] MRN: [id number] Date of Birth: [birth year] Referring Provider (PT): Jed Limerick NP   Encounter Date: 03/18/2019  PT End of Session - 03/18/19 1517    Visit Number  35    Date for PT Re-Evaluation  03/09/19    Authorization Type  Medicaid    Authorization Time Period  12/29/2018-03/22/2019    Authorization - Visit Number  12    Authorization - Number of Visits  12    PT Start Time  0093    PT Stop Time  8182    PT Time Calculation (min)  30 min    Activity Tolerance  Patient tolerated treatment well;No increased pain    Behavior During Therapy  WFL for tasks assessed/performed       Past Medical History:  Diagnosis Date  . Anxiety   . Asthma   . Depression   . GERD (gastroesophageal reflux disease)   . Migraines     Past Surgical History:  Procedure Laterality Date  . CYSTO WITH HYDRODISTENSION N/A 04/10/2018   Procedure: CYSTOSCOPY/HYDRODISTENSION;  Surgeon: Ardis Hughs, MD;  Location: [facility];  Service: Urology;  Laterality: N/A;  . UPPER GI ENDOSCOPY      There were no vitals filed for this visit.  Subjective Assessment - 03/18/19 1446    Subjective  I am having minimal burning pain and minimal urgency at night.    Patient Stated Goals  reduce pain    Currently in Pain?  Yes    Pain Score  4     Pain Location  Pelvis    Pain Orientation  Mid    Pain Descriptors / Indicators  Cramping    Pain Type  Chronic pain    Pain Onset  More than a month ago    Pain Frequency  Constant    Aggravating Factors   at night, stress, mid and after menstrual cycle, sitting for long period    Pain Relieving Factors  following diet, soak in baking soda water, heat, exercises help relax, hot baths    Multiple Pain Sites  No          [facility] PT Assessment - 03/18/19 0001      Assessment   Medical Diagnosis  N30.20 Chronic cystitis    Referring Provider (PT)  Valentino Nose Fredrich Romans NP    Onset Date/Surgical Date  01/25/17    Prior Therapy  none      Precautions   Precautions  None      Restrictions   Weight Bearing Restrictions  No      Home Environment   Living Environment  Private residence      Prior Function   Level of Independence  Independent    Vocation  Student    Leisure  none      Cognition   Overall Cognitive Status  Within Functional Limits for tasks assessed      Posture/Postural Control   Posture/Postural Control  Postural limitations    Postural Limitations  Rounded Shoulders;Forward head;Decreased thoracic kyphosis;Posterior pelvic tilt;Flexed trunk      AROM   Lumbar Flexion  full    Lumbar Extension  full    Lumbar - Right Side Bend  full    Lumbar - Left Side Bend  full  Strength   Right Hip Flexion  5/5    Right Hip External Rotation   5/5    Right Hip Internal Rotation  5/5    Right Hip ABduction  5/5    Left Hip Flexion  5/5    Left Hip Extension  5/5    Left Hip External Rotation  5/5    Left Hip Internal Rotation  5/5    Left Hip ABduction  5/5                Pelvic Floor Special Questions - 03/18/19 0001    Currently Sexually Active  No    Urinary Leakage  No    Fecal incontinence  No    Pelvic Floor Internal Exam  Patient confirmed identication and approves PT to assess muscle and treatment    Exam Type  Vaginal    Strength  good squeeze, good lift, able to hold agaisnt strong resistance        [facility] Adult PT Treatment/Exercise - 03/18/19 0001      Exercises   Exercises  Other Exercises    Other Exercises   verbally discussed with home exercise program and patient verbally understnads and is using her pelvic wand       Manual Therapy   Manual Therapy  Internal Pelvic Floor    Internal Pelvic Floor  bil. levator ani an dobturator internist;  fascial release around the bladder , release around the perineal body, release on the left side of uterus             PT Education - 03/18/19 1517    Education Details  verbally reviewed HEP and how to use the pelvic wand    Person(s) Educated  Patient    Methods  Explanation;Demonstration;Verbal cues;Handout    Comprehension  Returned demonstration;Verbalized understanding       PT Short Term Goals - 01/08/18 1024      PT SHORT TERM GOAL #1   Title  independent with initial HEP    Time  4    Period  Weeks    Status  Achieved      PT SHORT TERM GOAL #2   Title  understand how to toilet correctly to reduce strain on the pelvic floor    Time  4    Period  Weeks    Status  Achieved        PT Long Term Goals - 03/18/19 1454      PT LONG TERM GOAL #1   Title  independent with HEP    Time  8    Period  Days    Status  Achieved      PT LONG TERM GOAL #2   Title  reduce pain with vaginal penetration with exam and penile </= 3/10    Baseline  has not had intercourse    Time  8    Period  Weeks    Status  Deferred      PT LONG TERM GOAL #3   Title  burining with urination decreased >/= 75% due to ability to relax the pelvic floor    Time  8    Period  Weeks    Status  Achieved      PT LONG TERM GOAL #4   Title  reduce straining with bowel movement >/= 75% due to relaxation of pelvic floor muscles    Time  8    Period  Weeks    Status  Achieved  PT LONG TERM GOAL #5   Title  sitting for 30 minutes wiith pain level </3-4/10 due to decreased muscle tension     Time  12    Period  Weeks    Status  Achieved            Plan - 03/18/19 1518    Clinical Impression Statement  Patient has met all of her goals. Patient understands on ways to manage her pain with using her pelvic wand on trigger points, pelvic floor meditation, stretches and strengthening. Patient pelvic floor strength is 4/5. Patient bilateral hip strength is 5/5. Patient minimal to no  burning with urnation. Patient has minimal urgency at night. Patient is able to so sit for 30 minutes. Patient is not straining to have a bowel movement. Patient is ready for discharge.    Rehab Potential  Excellent    Clinical Impairments Affecting Rehab Potential  none    PT Treatment/Interventions  Biofeedback;Cryotherapy;Electrical Stimulation;Moist Heat;Ultrasound;Therapeutic exercise;Therapeutic activities;Neuromuscular re-education;Patient/family education;Manual techniques;Dry needling    PT Next Visit Plan  discharge to current HEP    PT Home Exercise Plan  ACCESS CODE: [facility]    Recommended Other Services  MD has signed all notes    Consulted and Agree with Plan of Care  Patient       Patient will benefit from skilled therapeutic intervention in order to improve the following deficits and impairments:  Increased fascial restricitons, Pain, Decreased mobility, Increased muscle spasms, Postural dysfunction, Decreased activity tolerance, Decreased range of motion, Decreased strength  Visit Diagnosis: Muscle weakness (generalized)  Cramp and spasm  Abnormal posture     Problem List Patient Active Problem List   Diagnosis Date Noted  . MDD (major depressive disorder), recurrent severe, without psychosis ([place]) 04/19/2018  . Suicide attempt by benzodiazepine overdose ([place]) 04/19/2018    Earlie Counts, PT 03/18/19 3:24 PM   [facility] [address] [place], [place], [address] Phone: [phone]   Fax:  [phone]  Name: Alexis [name] MRN: [id number] Date of Birth: 02/25/2001  PHYSICAL THERAPY DISCHARGE SUMMARY  Visits from Start of Care: 35  Current functional level related to goals / functional outcomes: See above.   Remaining deficits: See above.    Education / Equipment: HEP Plan: Patient agrees to discharge.  Patient goals were met. Patient is being discharged due to meeting the stated rehab  goals. Thank you for the referral. Earlie Counts, PT 03/18/19 3:24 PM   ?????

## 2019-07-22 ENCOUNTER — Ambulatory Visit: Payer: Medicaid Other | Attending: Urology | Admitting: Physical Therapy

## 2019-07-22 ENCOUNTER — Other Ambulatory Visit: Payer: Self-pay

## 2019-07-22 ENCOUNTER — Encounter: Payer: Self-pay | Admitting: Physical Therapy

## 2019-07-22 DIAGNOSIS — M6281 Muscle weakness (generalized): Secondary | ICD-10-CM | POA: Diagnosis present

## 2019-07-22 DIAGNOSIS — R252 Cramp and spasm: Secondary | ICD-10-CM | POA: Diagnosis present

## 2019-07-22 DIAGNOSIS — R293 Abnormal posture: Secondary | ICD-10-CM | POA: Diagnosis present

## 2019-07-22 NOTE — Therapy (Signed)
[facility] [address], [address] [place], [place], [address] Phone: [phone]   Fax:  [phone]  Physical Therapy Evaluation  Patient Details  Name: Alexis [name] MRN: [id number] Date of Birth: 11-03-2000 Referring Provider (PT): Dr. Berniece Salines   Encounter Date: 07/22/2019  PT End of Session - 07/22/19 1607    Visit Number  1    Date for PT Re-Evaluation  09/16/19    Authorization Type  Medicaid    PT Start Time  1530    PT Stop Time  1605    PT Time Calculation (min)  35 min    Activity Tolerance  Patient tolerated treatment well;No increased pain    Behavior During Therapy  WFL for tasks assessed/performed       Past Medical History:  Diagnosis Date  . Anxiety   . Asthma   . Depression   . GERD (gastroesophageal reflux disease)   . Migraines     Past Surgical History:  Procedure Laterality Date  . CYSTO WITH HYDRODISTENSION N/A 04/10/2018   Procedure: CYSTOSCOPY/HYDRODISTENSION;  Surgeon: Crist Fat, MD;  Location: [facility];  Service: Urology;  Laterality: N/A;  . UPPER GI ENDOSCOPY      There were no vitals filed for this visit.   Subjective Assessment - 07/22/19 1535    Subjective  Patient is able to eat different foods. No pain with sex. Patient reports back pain and left hip moves out of place. If I eat something wrong IC flares-up. I had a flare-up end of January. Sometimes gets urgency and gets it 2 times per week.    Patient Stated Goals  reduce the hip and back pain while managing pelvic floor    Currently in Pain?  Yes    Pain Score  6     Pain Location  Nose    Pain Orientation  Lower    Pain Descriptors / Indicators  Dull    Pain Type  Acute pain    Pain Onset  More than a month ago    Pain Frequency  Intermittent    Aggravating Factors   laying down alot and poor posture    Pain Relieving Factors  stretches    Multiple Pain Sites  Yes    Pain Score  6     Pain Location  Hip    Pain Orientation  Right    Pain Descriptors / Indicators  Pressure;Dull    Pain Type  Acute pain    Pain Onset  More than a month ago    Pain Frequency  Intermittent    Aggravating Factors   walking    Pain Relieving Factors  pressing down on right ilium         [facility] PT Assessment - 07/22/19 0001      Assessment   Medical Diagnosis  R10.2 Pelvic Pain    Referring Provider (PT)  Dr. Berniece Salines    Onset Date/Surgical Date  06/21/19    Prior Therapy  yes      Precautions   Precautions  None      Restrictions   Weight Bearing Restrictions  No      Balance Screen   Has the patient fallen in the past 6 months  No    Has the patient had a decrease in activity level because of a fear of falling?   No    Is the patient reluctant to leave their home because of  a fear of falling?   No      Home Public house manager residence      Prior Function   Level of Independence  Independent    Vocation  Student    Leisure  walks 15 minutes 2x/day      Cognition   Overall Cognitive Status  Within Functional Limits for tasks assessed      Posture/Postural Control   Posture/Postural Control  Postural limitations    Postural Limitations  Rounded Shoulders;Forward head;Increased thoracic kyphosis      ROM / Strength   AROM / PROM / Strength  AROM;PROM;Strength      AROM   Lumbar Extension  decreased by 25%    Lumbar - Right Side Bend  decreased by 25%    Lumbar - Left Side Bend  decreased by 25%      Strength   Right Hip ABduction  4-/5    Left Hip ABduction  4-/5      Palpation   Spinal mobility  Tightness in T4-L3 anteriorly and rotation    SI assessment   right ilium rotated anteriorly,     Palpation comment  tenderness located on the suprapubic bone on the right, right diaphragm and abdominals,                 Objective measurements completed on examination: See above findings.      [facility] Adult PT  Treatment/Exercise - 07/22/19 0001      Manual Therapy   Manual Therapy  Soft tissue mobilization;Joint mobilization    Joint Mobilization  P-A mobilization to T4-T10 to decreased kyphosis    Soft tissue mobilization  bilateral sides of the lower thoracic paraspinals to elongate the tissue after dry needling       Trigger Point Dry Needling - 07/22/19 0001    Consent Given?  Yes    Education Handout Provided  Yes    Muscles Treated Back/Hip  Thoracic multifidi   T12   Thoracic multifidi response  Twitch response elicited;Palpable increased muscle length           PT Education - 07/22/19 1607    Education Details  information on dry needling    Person(s) Educated  Patient    Methods  Explanation;Handout    Comprehension  Verbalized understanding       PT Short Term Goals - 07/22/19 1614      PT SHORT TERM GOAL #1   Title  independent with initial HEP    Time  4    Period  Weeks    Status  New    Target Date  08/19/19      PT SHORT TERM GOAL #2   Title  ---        PT Long Term Goals - 07/22/19 1558      PT LONG TERM GOAL #1   Title  independent with HEP    Baseline  not educated yet    Time  8    Period  Weeks    Status  New    Target Date  09/16/19      PT LONG TERM GOAL #2   Title  improved posture to reduce the thoracic kyphosis so she has reduction in back pain to </= 1/10    Baseline  increased in thoracic kyphosis, pain 6/10    Time  8    Period  Weeks    Status  New    Target  Date  09/16/19      PT LONG TERM GOAL #3   Title  increased core strength to stabilize her pelvis so the ASIS stay equal for 4 weeks    Baseline  Right ilium is anteriorly rotated and pops out of place    Time  8    Period  Weeks    Status  New    Target Date  09/16/19      PT LONG TERM GOAL #4   Title  reduction of bladder pressure >/= 50% of the time due to relaxation of the pelvic floor which will reduce urgency </= 1 time per week    Baseline  constant bladder  pressure; urgency 2 times per week    Time  8    Period  Weeks    Status  New    Target Date  09/16/19      PT LONG TERM GOAL #5   Title  ------------    Baseline  -             Plan - 07/22/19 1608    Clinical Impression Statement  Patient is a 19 year old female with chronic pelvic pain, back pain and right hip pain. Patient last flare-up was 06/20/2019 when she had a UTI. Patient reports back and hip pain at level 6/10 and pelvic pain is more of an urgency that happens 2 times per week. Patient reports she feels a constant pressure of the bladder and the suprapubic area is tender to touch. Patient has decreased lumbar bilateral sidebending and extension by 25%. Patient posture consists of forward head, rounded shoulders and increased thoracic kyphosis. Right ilium is anteriorly rotated and patient reports it pops often. Bilateral hip abduction is 4-/5. Patient has decreased movement of T4-L3 and decreased bilateral posterior rib cage. Patient has decreased core strength to keep her in upright posture and SI joint stabilized. Patient will benefit from skilled therapy to reduce pain and improve postural strength.    Personal Factors and Comorbidities  Comorbidity 2    Comorbidities  Interstitial Cystitis, depression    Examination-Activity Limitations  Stand;Sleep    Stability/Clinical Decision Making  Evolving/Moderate complexity    Clinical Decision Making  Low    Rehab Potential  Excellent    PT Frequency  1x / week    PT Duration  8 weeks    PT Treatment/Interventions  Cryotherapy;Electrical Stimulation;Moist Heat;Ultrasound;Traction;Neuromuscular re-education;Therapeutic exercise;Therapeutic activities;Patient/family education;Manual techniques;Dry needling;Taping    PT Next Visit Plan  dry needling to thoracic lumbar area; mobilization to thoracic; correct pelvis, abdominal tissue work, core strength    Consulted and Agree with Plan of Care  Patient       Patient will benefit  from skilled therapeutic intervention in order to improve the following deficits and impairments:  Decreased range of motion, Increased fascial restricitons, Increased muscle spasms, Pain, Postural dysfunction, Decreased strength  Visit Diagnosis: Muscle weakness (generalized) - Plan: PT plan of care cert/re-cert  Cramp and spasm - Plan: PT plan of care cert/re-cert  Abnormal posture - Plan: PT plan of care cert/re-cert     Problem List Patient Active Problem List   Diagnosis Date Noted  . MDD (major depressive disorder), recurrent severe, without psychosis ([place]) 04/19/2018  . Suicide attempt by benzodiazepine overdose ([place]) 04/19/2018    Earlie Counts, PT 07/22/19 4:16 PM   [facility] [address], [place], [place], [address] Phone: [phone]   Fax:  [phone]  Name: Alexis [name] MRN: [id number] Date of Birth: 09-09-2000

## 2019-07-22 NOTE — Patient Instructions (Signed)

## 2019-07-29 ENCOUNTER — Ambulatory Visit: Payer: Medicaid Other | Admitting: Physical Therapy

## 2019-08-05 ENCOUNTER — Ambulatory Visit: Payer: Medicaid Other | Attending: Urology | Admitting: Physical Therapy

## 2019-08-05 ENCOUNTER — Other Ambulatory Visit: Payer: Self-pay

## 2019-08-05 ENCOUNTER — Encounter: Payer: Self-pay | Admitting: Physical Therapy

## 2019-08-05 DIAGNOSIS — R293 Abnormal posture: Secondary | ICD-10-CM

## 2019-08-05 DIAGNOSIS — M6281 Muscle weakness (generalized): Secondary | ICD-10-CM

## 2019-08-05 DIAGNOSIS — R252 Cramp and spasm: Secondary | ICD-10-CM | POA: Diagnosis present

## 2019-08-05 NOTE — Patient Instructions (Signed)
Access Code: [employer] URL: [email or web address] Date: 08/05/2019 Prepared by: Eulis Foster  Exercises Child's Pose Stretch - 1 reps - 1 sets - 30 sec hold - 1x daily - 7x weekly Supine Lower Trunk Rotation - 2 reps - 1 sets - 30 sec hold - 1x daily - 7x weekly Supine Pelvic Floor Stretch - 1 reps - 1 sets - 1 min hold - 1x daily - 7x weekly Supine Diaphragmatic Breathing with Pelvic Floor Lengthening - 10 reps - 1 sets - 2x daily - 7x weekly Pigeon Pose - 2 reps - 1 sets - 30 sec hold - 1x daily - 7x weekly Prone Hip Extension - Two Pillows - 10 reps - 2 sets - 1x daily - 7x weekly Prone Hip Extension with Mini Swiss Ball - 10 reps - 1 sets - 1x daily - 7x weekly [facility] [address] [place], [place] [address] Phone # [phone] Fax [phone]

## 2019-08-05 NOTE — Therapy (Signed)
[facility] [address] [place], [place], [address] Phone: [phone]   Fax:  [phone]  Physical Therapy Treatment  Patient Details  Name: Alexis [name] MRN: [id number] Date of Birth: 04-04-2001 Referring Provider (PT): Dr. Louis Meckel   Encounter Date: 08/05/2019  PT End of Session - 08/05/19 1532    Visit Number  2    Date for PT Re-Evaluation  09/16/19    Authorization Time Period  07/29/2019-08/18/2019    Authorization - Visit Number  1    Authorization - Number of Visits  3    PT Start Time  2500    PT Stop Time  1610    PT Time Calculation (min)  40 min    Activity Tolerance  Patient tolerated treatment well;No increased pain    Behavior During Therapy  WFL for tasks assessed/performed       Past Medical History:  Diagnosis Date  . Anxiety   . Asthma   . Depression   . GERD (gastroesophageal reflux disease)   . Migraines     Past Surgical History:  Procedure Laterality Date  . CYSTO WITH HYDRODISTENSION N/A 04/10/2018   Procedure: CYSTOSCOPY/HYDRODISTENSION;  Surgeon: Ardis Hughs, MD;  Location: [facility];  Service: Urology;  Laterality: N/A;  . UPPER GI ENDOSCOPY      There were no vitals filed for this visit.  Subjective Assessment - 08/05/19 1534    Subjective  I have some pelvic pain due to being on my cycle and burning in the vulva area. Small amount of urgency.    Patient Stated Goals  reduce the hip and back pain while managing pelvic floor    Currently in Pain?  Yes    Pain Score  3     Pain Location  Back    Pain Orientation  Lower    Pain Descriptors / Indicators  Constant;Aching    Pain Type  Chronic pain    Pain Onset  More than a month ago    Pain Frequency  Constant    Aggravating Factors   standing    Pain Relieving Factors  stretches         [facility] PT Assessment - 08/05/19 0001      Palpation   SI assessment   right ilium rotated  anteriorly,                    [facility] Adult PT Treatment/Exercise - 08/05/19 0001      Lumbar Exercises: Stretches   Lower Trunk Rotation  2 reps;30 seconds    Lower Trunk Rotation Limitations  both sides    Quadruped Mid Back Stretch  2 reps;30 seconds    Piriformis Stretch  Right;Left;1 rep;30 seconds    Piriformis Stretch Limitations  pigeon pose    Other Lumbar Stretch Exercise  happy baby stretch with belly breathing      Lumbar Exercises: Aerobic   Stationary Bike  6 minutes, level 1 while assess patient and to build endurance      Lumbar Exercises: Supine   Ab Set  10 reps;5 seconds      Lumbar Exercises: Prone   Straight Leg Raise  20 reps;1 second    Straight Leg Raises Limitations  contract the gluteal first     Other Prone Lumbar Exercises  frog leg hip extension 10x then bil. hip extension with ball between feet 10x      Manual Therapy  Manual Therapy  Muscle Energy Technique    Joint Mobilization  P-A mobilization to T4-T10 to decreased kyphosis    Muscle Energy Technique  correct right ilium             PT Education - 08/05/19 1608    Education Details  Access Code: [facility]    Person(s) Educated  Patient    Methods  Explanation;Demonstration;Verbal cues;Handout    Comprehension  Verbalized understanding;Returned demonstration       PT Short Term Goals - 07/22/19 1614      PT SHORT TERM GOAL #1   Title  independent with initial HEP    Time  4    Period  Weeks    Status  New    Target Date  08/19/19      PT SHORT TERM GOAL #2   Title  ---        PT Long Term Goals - 07/22/19 1558      PT LONG TERM GOAL #1   Title  independent with HEP    Baseline  not educated yet    Time  8    Period  Weeks    Status  New    Target Date  09/16/19      PT LONG TERM GOAL #2   Title  improved posture to reduce the thoracic kyphosis so she has reduction in back pain to </= 1/10    Baseline  increased in thoracic kyphosis, pain 6/10    Time   8    Period  Weeks    Status  New    Target Date  09/16/19      PT LONG TERM GOAL #3   Title  increased core strength to stabilize her pelvis so the ASIS stay equal for 4 weeks    Baseline  Right ilium is anteriorly rotated and pops out of place    Time  8    Period  Weeks    Status  New    Target Date  09/16/19      PT LONG TERM GOAL #4   Title  reduction of bladder pressure >/= 50% of the time due to relaxation of the pelvic floor which will reduce urgency </= 1 time per week    Baseline  constant bladder pressure; urgency 2 times per week    Time  8    Period  Weeks    Status  New    Target Date  09/16/19      PT LONG TERM GOAL #5   Title  ------------    Baseline  -            Plan - 08/05/19 1556    Clinical Impression Statement  Patient is having some pelvic pain due to her being on her cycle today. Patient is learning how to strengthen her core due to her weakness. Patient has difficulty with diaphragmatic breathing. Patient pelvis was in correct alignment after manual work. Patient continues to have tightness in the thoracic spine. Patient has not met goals due to just starting therapy. Patient will benefit from skilled therapy to reduce pain and improv e postural strength.    Personal Factors and Comorbidities  Comorbidity 2    Comorbidities  Interstitial Cystitis, depression    Examination-Activity Limitations  Stand;Sleep    Stability/Clinical Decision Making  Evolving/Moderate complexity    Rehab Potential  Excellent    PT Frequency  1x / week    PT Duration  8  weeks    PT Treatment/Interventions  Cryotherapy;Electrical Stimulation;Moist Heat;Ultrasound;Traction;Neuromuscular re-education;Therapeutic exercise;Therapeutic activities;Patient/family education;Manual techniques;Dry needling;Taping    PT Next Visit Plan  dry needling to thoracic lumbar area;  correct pelvis, abdominal tissue work, core strength    PT Home Exercise Plan  Access Code: [facility]     Recommended Other Services  MD signed initial note    Consulted and Agree with Plan of Care  Patient       Patient will benefit from skilled therapeutic intervention in order to improve the following deficits and impairments:  Decreased range of motion, Increased fascial restricitons, Increased muscle spasms, Pain, Postural dysfunction, Decreased strength  Visit Diagnosis: Muscle weakness (generalized)  Cramp and spasm  Abnormal posture     Problem List Patient Active Problem List   Diagnosis Date Noted  . MDD (major depressive disorder), recurrent severe, without psychosis ([place]) 04/19/2018  . Suicide attempt by benzodiazepine overdose ([place]) 04/19/2018    Earlie Counts, PT 08/05/19 4:15 PM   [facility] [address] [place], [place], [address] Phone: [phone]   Fax:  [phone]  Name: DESHAE DICKISON MRN: [id number] Date of Birth: [birth year]

## 2019-08-12 ENCOUNTER — Ambulatory Visit: Payer: Medicaid Other | Admitting: Physical Therapy

## 2019-08-12 ENCOUNTER — Other Ambulatory Visit: Payer: Self-pay

## 2019-08-12 ENCOUNTER — Encounter: Payer: Self-pay | Admitting: Physical Therapy

## 2019-08-12 DIAGNOSIS — M6281 Muscle weakness (generalized): Secondary | ICD-10-CM

## 2019-08-12 DIAGNOSIS — R252 Cramp and spasm: Secondary | ICD-10-CM

## 2019-08-12 DIAGNOSIS — R293 Abnormal posture: Secondary | ICD-10-CM

## 2019-08-12 NOTE — Therapy (Signed)
[facility] [address] [place], [place], [address] Phone: [phone]   Fax:  [phone]  Physical Therapy Treatment  Patient Details  Name: Alexis [name] MRN: [id number] Date of Birth: August 08, 2000 Referring Provider (PT): Dr. Berniece Salines   Encounter Date: 08/12/2019  PT End of Session - 08/12/19 1504    Visit Number  3    Date for PT Re-Evaluation  09/16/19    Authorization Type  Medicaid    Authorization Time Period  07/29/2019-08/18/2019    Authorization - Visit Number  2    Authorization - Number of Visits  3    PT Start Time  1400    PT Stop Time  1446    PT Time Calculation (min)  46 min    Activity Tolerance  Patient tolerated treatment well;No increased pain    Behavior During Therapy  WFL for tasks assessed/performed       Past Medical History:  Diagnosis Date  . Anxiety   . Asthma   . Depression   . GERD (gastroesophageal reflux disease)   . Migraines     Past Surgical History:  Procedure Laterality Date  . CYSTO WITH HYDRODISTENSION N/A 04/10/2018   Procedure: CYSTOSCOPY/HYDRODISTENSION;  Surgeon: Crist Fat, MD;  Location: [facility];  Service: Urology;  Laterality: N/A;  . UPPER GI ENDOSCOPY      There were no vitals filed for this visit.  Subjective Assessment - 08/12/19 1408    Subjective  I am feeling depressed. I had my medicine changed. I have not done my exercises due to not having the motivation.    Patient Stated Goals  reduce the hip and back pain while managing pelvic floor    Currently in Pain?  Yes    Pain Score  6     Pain Location  Back    Pain Orientation  Lower    Pain Descriptors / Indicators  Constant;Aching    Pain Type  Chronic pain    Pain Onset  More than a month ago    Pain Frequency  Intermittent    Aggravating Factors   standing up straight    Pain Relieving Factors  stretches    Multiple Pain Sites  Yes    Pain Score  5    Pain Location  Pelvis    Pain Orientation  Lower    Pain Descriptors / Indicators  Throbbing    Pain Type  Acute pain    Pain Onset  More than a month ago    Pain Frequency  Constant    Aggravating Factors   being on cycle, stress, panic attacks    Pain Relieving Factors  heat pack, taking medication, hot baths         [facility] PT Assessment - 08/12/19 0001      Assessment   Medical Diagnosis  R10.2 Pelvic Pain    Referring Provider (PT)  Dr. Berniece Salines    Onset Date/Surgical Date  06/21/19    Prior Therapy  yes      Precautions   Precautions  None      Restrictions   Weight Bearing Restrictions  No      Home Environment   Living Environment  Private residence      Prior Function   Level of Independence  Independent    Vocation  Student    Leisure  walks 15 minutes 2x/day      Cognition  Overall Cognitive Status  Within Functional Limits for tasks assessed      Posture/Postural Control   Posture/Postural Control  Postural limitations    Postural Limitations  Rounded Shoulders;Forward head;Increased thoracic kyphosis    Posture Comments  angle of rib cage is below 90 degrees      AROM   Lumbar - Right Side Bend  decreased by 25%    Lumbar - Left Side Bend  decreased by 25%      Strength   Right Hip ABduction  4-/5    Left Hip ABduction  4-/5      Palpation   Spinal mobility  decreased movement of the lower rib cage with breath, decreased movement of T4-L5    SI assessment   right ilium rotated anteriorly,     Palpation comment  tightness in the thoracic lumbar muscles, tightness in the obliques, anterior intercoastal muscles                   [facility] Adult PT Treatment/Exercise - 08/12/19 0001      Neuro Re-ed    Neuro Re-ed Details   breathing into the lower rib cage while the therapist assists in expansion of the lower rib cage then filling up her abdomen, as she breathes out deflate the abdomen then the ches      Lumbar Exercises: Stretches    Active Hamstring Stretch  Right;Left;1 rep;30 seconds    Active Hamstring Stretch Limitations  supine    Lower Trunk Rotation  2 reps;30 seconds    Lower Trunk Rotation Limitations  both sides    Prone Mid Back Stretch  3 reps;30 seconds    Prone Mid Back Stretch Limitations  all three directions    Piriformis Stretch  Right;Left;1 rep;30 seconds    Piriformis Stretch Limitations  supine      Lumbar Exercises: Aerobic   Nustep  level 1 for 6 minutes while assessing patient      Manual Therapy   Manual Therapy  Soft tissue mobilization;Myofascial release;Joint mobilization    Joint Mobilization  P-A mobilization to T4-T10 to decreased kyphosis    Soft tissue mobilization  bilateral lumbar and thoracic paraspinals, soft tissue work with pulling the tissue from her spine to the anterior abdominal tissue in quadruped, pulling the thoracic soft tissue forward, soft tissue work in the intercoastals posteriorly and anterior for ribs 7-12.     Myofascial Release  tissue rolling to the thoracic and lumbar       Trigger Point Dry Needling - 08/12/19 0001    Consent Given?  Yes    Education Handout Provided  Previously provided    Muscles Treated Back/Hip  Lumbar multifidi;Thoracic multifidi    Lumbar multifidi Response  Twitch response elicited;Palpable increased muscle length    Thoracic multifidi response  Twitch response elicited;Palpable increased muscle length           PT Education - 08/12/19 1503    Education Details  breathing from the chest to the abdoment then reverse the breath to move the rib cage    Person(s) Educated  Patient    Methods  Explanation;Demonstration    Comprehension  Verbalized understanding;Returned demonstration       PT Short Term Goals - 08/12/19 1510      PT SHORT TERM GOAL #1   Title  independent with initial HEP    Time  4    Period  Weeks    Status  On-going  PT Long Term Goals - 08/12/19 1511      PT LONG TERM GOAL #1   Title   independent with HEP    Baseline  educated with new exercises as she progressess    Time  8    Period  Weeks    Status  On-going      PT LONG TERM GOAL #2   Title  improved posture to reduce the thoracic kyphosis so she has reduction in back pain to </= 1/10    Baseline  increased in thoracic kyphosis, pain 6/10    Time  8    Period  Weeks    Status  On-going      PT LONG TERM GOAL #3   Title  increased core strength to stabilize her pelvis so the ASIS stay equal for 4 weeks    Baseline  Right ilium is anteriorly rotated and pops out of place    Time  8    Period  Weeks    Status  On-going      PT LONG TERM GOAL #4   Title  reduction of bladder pressure >/= 50% of the time due to relaxation of the pelvic floor which will reduce urgency </= 1 time per week    Baseline  constant bladder pressure; urgency 2 times per week    Time  8    Period  Weeks    Status  On-going            Plan - 08/12/19 1504    Clinical Impression Statement  Patient has difficulty with opening her lower rib cage and expanding her abdoment due to decreased rib and thoracic mobilty and soft tissue mobility. Patient has a narrow rib cage. Patient has increased fascial tightness in the lumbar and thoracic area. After manual work the rib cage was able to open up and pain decreased from 8/10 to 5/10. Patient continues to have a weak core making it difficult for pelvic and lumbar stability. Patient is dealing with depression making it difficult for her to have the energy to do her home program. He therapist has adjusted her medication to assist her in the depression and looking into her being Bipolar. Patient has difficulty with standing upright due to weakness in her back and slouched posture. Patient will benefit from skilled therapy to reduce pain and improve postural strength.    Personal Factors and Comorbidities  Comorbidity 2    Comorbidities  Interstitial Cystitis, depression    Examination-Activity  Limitations  Stand;Sleep    Stability/Clinical Decision Making  Evolving/Moderate complexity    Rehab Potential  Excellent    PT Frequency  1x / week    PT Duration  8 weeks    PT Treatment/Interventions  Cryotherapy;Electrical Stimulation;Moist Heat;Ultrasound;Traction;Neuromuscular re-education;Therapeutic exercise;Therapeutic activities;Patient/family education;Manual techniques;Dry needling;Taping    PT Next Visit Plan  dry needling to thoracic lumbar area;  correct pelvis, abdominal tissue work, core strength, work on lower rib mobility, sitting side bend with tissue rolling    PT Home Exercise Plan  Access Code: [facility]    Consulted and Agree with Plan of Care  Patient       Patient will benefit from skilled therapeutic intervention in order to improve the following deficits and impairments:  Decreased range of motion, Increased fascial restricitons, Increased muscle spasms, Pain, Postural dysfunction, Decreased strength  Visit Diagnosis: Muscle weakness (generalized)  Cramp and spasm  Abnormal posture     Problem List Patient Active Problem List  Diagnosis Date Noted  . MDD (major depressive disorder), recurrent severe, without psychosis (HCC) 04/19/2018  . Suicide attempt by benzodiazepine overdose (HCC) 04/19/2018    Eulis Foster, PT 08/12/19 3:12 PM   [facility] [address], [address] [place], [place], [address] Phone: [phone]   Fax:  [phone]  Name: Alexis [name] MRN: [id number] Date of Birth: Dec 10, 2000

## 2019-08-26 ENCOUNTER — Ambulatory Visit: Payer: Medicaid Other | Attending: Urology | Admitting: Physical Therapy

## 2019-08-26 ENCOUNTER — Encounter: Payer: Self-pay | Admitting: Physical Therapy

## 2019-08-26 ENCOUNTER — Other Ambulatory Visit: Payer: Self-pay

## 2019-08-26 DIAGNOSIS — M6281 Muscle weakness (generalized): Secondary | ICD-10-CM

## 2019-08-26 DIAGNOSIS — R293 Abnormal posture: Secondary | ICD-10-CM | POA: Insufficient documentation

## 2019-08-26 DIAGNOSIS — R252 Cramp and spasm: Secondary | ICD-10-CM | POA: Diagnosis present

## 2019-08-26 NOTE — Therapy (Signed)
[facility] [address] [place], [place], [address] Phone: [phone]   Fax:  [phone]  Physical Therapy Treatment  Patient Details  Name: Alexis [name] MRN: [id number] Date of Birth: 2000-12-30 Referring Provider (PT): Dr. Berniece Salines   Encounter Date: 08/26/2019  PT End of Session - 08/26/19 1225    Visit Number  4    Date for PT Re-Evaluation  09/16/19    Authorization Type  Medicaid    Authorization Time Period  08/26/2019-11/17/2019    Authorization - Visit Number  1    Authorization - Number of Visits  12    PT Start Time  1145    PT Stop Time  1223    PT Time Calculation (min)  38 min    Activity Tolerance  Patient tolerated treatment well;No increased pain    Behavior During Therapy  WFL for tasks assessed/performed       Past Medical History:  Diagnosis Date  . Anxiety   . Asthma   . Depression   . GERD (gastroesophageal reflux disease)   . Migraines     Past Surgical History:  Procedure Laterality Date  . CYSTO WITH HYDRODISTENSION N/A 04/10/2018   Procedure: CYSTOSCOPY/HYDRODISTENSION;  Surgeon: Crist Fat, MD;  [facility];  Service: Urology;  Laterality: N/A;  . UPPER GI ENDOSCOPY      There were no vitals filed for this visit.  Subjective Assessment - 08/26/19 1149    Subjective  I had a small flare-up and saw the doctor. I have a little pain in my abdomen. 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    Patient Stated Goals  reduce the hip and back pain while managing pelvic floor    Currently in Pain?  Yes    Pain Score  1     Pain Location  Back    Pain Orientation  Lower    Pain Descriptors / Indicators  Aching;Constant    Pain Type  Chronic pain    Pain Onset  More than a month ago    Pain Frequency  Intermittent    Aggravating Factors   standing up straight    Pain Relieving Factors  stretches    Multiple Pain Sites  Yes    Pain Score  4    Pain  Location  Pelvis    Pain Orientation  Lower    Pain Descriptors / Indicators  Throbbing    Pain Type  Acute pain    Pain Onset  More than a month ago    Pain Frequency  Constant    Aggravating Factors   being on cycle, stress, panic attacks    Pain Relieving Factors  heat pack, taking medication, hot baths                       [facility] Adult PT Treatment/Exercise - 08/26/19 0001      Lumbar Exercises: Aerobic   Nustep  level 2 for 7 minutes while assessing patient      Lumbar Exercises: Supine   Ab Set  10 reps    AB Set Limitations  with rolling feet back and forth on foam roll    Bridge  15 reps    Bridge Limitations  feet on foam roll    Other Supine Lumbar Exercises  lay on foam roll and decompress;     Other Supine Lumbar Exercises  stay bridged with alternate shoulder flexion  and feet on foam roll      Shoulder Exercises: Supine   Horizontal ABduction  Strengthening;Left;10 reps;Theraband   each way   Theraband Level (Shoulder Horizontal ABduction)  Level 2 (Red)    Horizontal ABduction Limitations  on foam roll    Flexion  Strengthening;Right;Left;20 reps    Flexion Limitations  on foam roll    Diagonals  Strengthening;Right;Left;10 reps;Theraband    Theraband Level (Shoulder Diagonals)  Level 2 (Red)    Diagonals Limitations  on foam roll    Other Supine Exercises  lay on foam roll across back and extend spine      Manual Therapy   Manual Therapy  Muscle Energy Technique    Joint Mobilization  P-A mobilization to T4-T10 to decreased kyphosis    Soft tissue mobilization  soft tissue mobilizaiton to bilateral thoracic paraspinals to elongate after dry needling    Muscle Energy Technique  correct right and left ilium               PT Short Term Goals - 08/26/19 1228      PT SHORT TERM GOAL #1   Title  independent with initial HEP    Time  4    Period  Weeks    Status  Achieved        PT Long Term Goals - 08/12/19 1511      PT LONG  TERM GOAL #1   Title  independent with HEP    Baseline  educated with new exercises as she progressess    Time  8    Period  Weeks    Status  On-going      PT LONG TERM GOAL #2   Title  improved posture to reduce the thoracic kyphosis so she has reduction in back pain to </= 1/10    Baseline  increased in thoracic kyphosis, pain 6/10    Time  8    Period  Weeks    Status  On-going      PT LONG TERM GOAL #3   Title  increased core strength to stabilize her pelvis so the ASIS stay equal for 4 weeks    Baseline  Right ilium is anteriorly rotated and pops out of place    Time  8    Period  Weeks    Status  On-going      PT LONG TERM GOAL #4   Title  reduction of bladder pressure >/= 50% of the time due to relaxation of the pelvic floor which will reduce urgency </= 1 time per week    Baseline  constant bladder pressure; urgency 2 times per week    Time  8    Period  Weeks    Status  On-going            Plan - 08/26/19 1226    Clinical Impression Statement  Patient has worked on core exercises to improve SI and lumbar stability. Patient had a flare-up with her interstitial cystitis and handled it well. Patient is modeling and the standing up straight with good posture fatiques her back muscles. Patient pelvis will shake with her core strength. Patient will benefit from skilled therapy to reduce pain and improve postural strength.    Personal Factors and Comorbidities  Comorbidity 2    Comorbidities  Interstitial Cystitis, depression    Examination-Activity Limitations  Stand;Sleep    Stability/Clinical Decision Making  Evolving/Moderate complexity    Rehab Potential  Excellent  PT Frequency  1x / week    PT Duration  8 weeks    PT Treatment/Interventions  Cryotherapy;Electrical Stimulation;Moist Heat;Ultrasound;Traction;Neuromuscular re-education;Therapeutic exercise;Therapeutic activities;Patient/family education;Manual techniques;Dry needling;Taping    PT Next Visit Plan   continues to work on core strength and advance her HEP    PT Home Exercise Plan  Access Code: [facility]    Consulted and Agree with Plan of Care  Patient       Patient will benefit from skilled therapeutic intervention in order to improve the following deficits and impairments:  Decreased range of motion, Increased fascial restricitons, Increased muscle spasms, Pain, Postural dysfunction, Decreased strength  Visit Diagnosis: Muscle weakness (generalized)  Cramp and spasm  Abnormal posture     Problem List Patient Active Problem List   Diagnosis Date Noted  . MDD (major depressive disorder), recurrent severe, without psychosis ([facility]) 04/19/2018  . Suicide attempt by benzodiazepine overdose ([place]) 04/19/2018    Earlie Counts, PT 08/26/19 12:29 PM   [facility] [address] [place], [place], [address] Phone: [phone]   Fax:  [phone]  Name: Alexis [name] MRN: [id number] Date of Birth: [birth year]

## 2019-09-02 ENCOUNTER — Encounter: Payer: Self-pay | Admitting: Physical Therapy

## 2019-09-02 ENCOUNTER — Other Ambulatory Visit: Payer: Self-pay

## 2019-09-02 ENCOUNTER — Ambulatory Visit: Payer: Medicaid Other | Admitting: Physical Therapy

## 2019-09-02 DIAGNOSIS — R252 Cramp and spasm: Secondary | ICD-10-CM

## 2019-09-02 DIAGNOSIS — M6281 Muscle weakness (generalized): Secondary | ICD-10-CM

## 2019-09-02 DIAGNOSIS — R293 Abnormal posture: Secondary | ICD-10-CM

## 2019-09-02 NOTE — Therapy (Signed)
[facility] [address], [place], [place], [address] Phone: [phone]   Fax:  [phone]  Physical Therapy Treatment  Patient Details  Name: Alexis [name] MRN: [id number] Date of Birth: 2000/06/30 Referring Provider (Alexis [name]): Dr. Louis Meckel   Encounter Date: 09/02/2019  Alexis [name] End of Session - 09/02/19 1455    Visit Number  5    Date for Alexis [name] Re-Evaluation  09/16/19    Authorization Type  Medicaid    Authorization Time Period  08/26/2019-11/17/2019    Authorization - Visit Number  2    Authorization - Number of Visits  12    Alexis [name] Start Time  8527    Alexis [name] Stop Time  1310    Alexis [name] Time Calculation (min)  40 min    Activity Tolerance  Patient tolerated treatment well;No increased pain    Behavior During Therapy  WFL for tasks assessed/performed       Past Medical History:  Diagnosis Date  . Anxiety   . Asthma   . Depression   . GERD (gastroesophageal reflux disease)   . Migraines     Past Surgical History:  Procedure Laterality Date  . CYSTO WITH HYDRODISTENSION N/A 04/10/2018   Procedure: CYSTOSCOPY/HYDRODISTENSION;  Surgeon: Ardis Hughs, MD;  Location: [facility];  Service: Urology;  Laterality: N/A;  . UPPER GI ENDOSCOPY      There were no vitals filed for this visit.  Subjective Assessment - 09/02/19 1234    Subjective  My back has not hurt this week. I feel my hip is not aligned. I am more aware with my posture. I have the urgency at night and I have to get up to go to the bathroom 5-6 times for the past 2 weeks.    Patient Stated Goals  reduce the hip and back pain while managing pelvic floor    Currently in Pain?  No/denies         [facility] Alexis [name] Assessment - 09/02/19 0001      Palpation   SI assessment   ASIS are equal                   [facility] Adult Alexis [name] Treatment/Exercise - 09/02/19 0001      Neuro Re-ed    Neuro Re-ed Details   place hand on bladder with genlty moving  the dkin over the bladder then working on diphragmatice breathing and reading a book to reduce the urge to void.       Lumbar Exercises: Aerobic   Stationary Bike  6 minutes, level 3 while assess patient and to build endurance      Shoulder Exercises: Prone   Extension  Strengthening;Both;15 reps    Horizontal ABduction 1  Strengthening;10 reps;Both    Horizontal ABduction 1 Limitations  palms down    Other Prone Exercises  Y motion of shoulders 10x      Manual Therapy   Manual Therapy  Myofascial release    Myofascial Release  to the urogenital diaphram, the bladder and along the midline of the abdomen               Alexis [name] Short Term Goals - 08/26/19 1228      Alexis [name] SHORT TERM GOAL #1   Title  independent with initial HEP    Time  4    Period  Weeks    Status  Achieved        Alexis [name] Long Term Goals -  09/02/19 1459      Alexis [name] LONG TERM GOAL #1   Title  independent with HEP    Baseline  educated with new exercises as she progressess    Time  8    Period  Weeks    Status  On-going      Alexis [name] LONG TERM GOAL #2   Title  improved posture to reduce the thoracic kyphosis so she has reduction in back pain to </= 1/10    Baseline  better posture and today no pain    Time  8    Period  Weeks    Status  On-going      Alexis [name] LONG TERM GOAL #3   Title  increased core strength to stabilize her pelvis so the ASIS stay equal for 4 weeks    Baseline  ASIS are equal today, 09/02/2019    Time  8    Period  Weeks    Status  On-going      Alexis [name] LONG TERM GOAL #4   Title  reduction of bladder pressure >/= 50% of the time due to relaxation of the pelvic floor which will reduce urgency </= 1 time per week    Baseline  constant bladder pressure; urgency 5-6  times at night    Time  8    Status  On-going            Plan - 09/02/19 1455    Clinical Impression Statement  Patient reports her back pain is doing better. Patient is having urgency at night and has to go to the bathroom 5-6 times at  night. Patient learned ways to do her own fascial release on her bladder for night time, diaphragmatic breathing, and to read a book to take her mind off the urgency. Patient pelvis was in correct alignment. Patient is working on back strengthening and SI stability to decrease her back pain. Patient does well with fascial release of the bladder. Patient will benefit from skilled therapy to reduce pain and improve postural strength.    Personal Factors and Comorbidities  Comorbidity 2    Comorbidities  Interstitial Cystitis, depression    Examination-Activity Limitations  Stand;Sleep    Stability/Clinical Decision Making  Evolving/Moderate complexity    Rehab Potential  Excellent    Alexis [name] Frequency  1x / week    Alexis [name] Duration  8 weeks    Alexis [name] Treatment/Interventions  Cryotherapy;Electrical Stimulation;Moist Heat;Ultrasound;Traction;Neuromuscular re-education;Therapeutic exercise;Therapeutic activities;Patient/family education;Manual techniques;Dry needling;Taping    Alexis [name] Next Visit Plan  work on hip abduction, give prone scapular exercises    Alexis [name] Home Exercise Plan  Access Code: [facility]    Consulted and Agree with Plan of Care  Patient       Patient will benefit from skilled therapeutic intervention in order to improve the following deficits and impairments:  Decreased range of motion, Increased fascial restricitons, Increased muscle spasms, Pain, Postural dysfunction, Decreased strength  Visit Diagnosis: Muscle weakness (generalized)  Cramp and spasm  Abnormal posture     Problem List Patient Active Problem List   Diagnosis Date Noted  . MDD (major depressive disorder), recurrent severe, without psychosis (HCC) 04/19/2018  . Suicide attempt by benzodiazepine overdose (HCC) 04/19/2018    Alexis [name], Alexis [name] 09/02/19 3:01 PM   [facility] [address] [place], [place], [address] Phone: [phone]   Fax:  [phone]  Name:  Alexis [name] MRN: [id number] Date of Birth: 01-24-2001

## 2019-09-09 ENCOUNTER — Ambulatory Visit: Payer: Medicaid Other | Admitting: Physical Therapy

## 2019-09-09 ENCOUNTER — Encounter: Payer: Self-pay | Admitting: Physical Therapy

## 2019-09-09 ENCOUNTER — Other Ambulatory Visit: Payer: Self-pay

## 2019-09-09 DIAGNOSIS — M6281 Muscle weakness (generalized): Secondary | ICD-10-CM | POA: Diagnosis not present

## 2019-09-09 DIAGNOSIS — R293 Abnormal posture: Secondary | ICD-10-CM

## 2019-09-09 DIAGNOSIS — R252 Cramp and spasm: Secondary | ICD-10-CM
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
## 2019-09-16 ENCOUNTER — Ambulatory Visit: Payer: Medicaid Other | Admitting: Physical Therapy

## 2019-09-23 ENCOUNTER — Encounter: Payer: Medicaid Other | Admitting: Physical Therapy

## 2019-09-30 ENCOUNTER — Ambulatory Visit: Payer: Medicaid Other | Attending: Urology | Admitting: Physical Therapy

## 2019-09-30 ENCOUNTER — Encounter: Payer: Self-pay | Admitting: Physical Therapy

## 2019-09-30 ENCOUNTER — Other Ambulatory Visit: Payer: Self-pay

## 2019-09-30 DIAGNOSIS — R293 Abnormal posture: Secondary | ICD-10-CM | POA: Diagnosis present

## 2019-09-30 DIAGNOSIS — R252 Cramp and spasm: Secondary | ICD-10-CM | POA: Diagnosis present

## 2019-09-30 DIAGNOSIS — M6281 Muscle weakness (generalized): Secondary | ICD-10-CM | POA: Diagnosis not present

## 2019-09-30 NOTE — Therapy (Signed)
[facility] [address], [address] [place], [place], [address] Phone: [phone]   Fax:  [phone]  Physical Therapy Treatment  Patient Details  Name: Alexis [name] MRN: [id number] Date of Birth: [birth year] Referring Provider (PT): Dr. Berniece Salines   Encounter Date: 09/30/2019  PT End of Session - 09/30/19 1237    Visit Number  7    Date for PT Re-Evaluation  12/23/19    Authorization Type  Medicaid    Authorization Time Period  08/26/2019-11/17/2019    Authorization - Visit Number  4    Authorization - Number of Visits  12    PT Start Time  1230    PT Stop Time  1310    PT Time Calculation (min)  40 min    Activity Tolerance  Patient tolerated treatment well;No increased pain    Behavior During Therapy  WFL for tasks assessed/performed       Past Medical History:  Diagnosis Date  . Anxiety   . Asthma   . Depression   . GERD (gastroesophageal reflux disease)   . Migraines     Past Surgical History:  Procedure Laterality Date  . CYSTO WITH HYDRODISTENSION N/A 04/10/2018   Procedure: CYSTOSCOPY/HYDRODISTENSION;  Surgeon: Crist Fat, MD;  Location: [facility];  Service: Urology;  Laterality: N/A;  . UPPER GI ENDOSCOPY      There were no vitals filed for this visit.  Subjective Assessment - 09/30/19 1239    Subjective  I have bad stomach cramps when I wake up. A couple days ago I had pain with urination. I did alot of work at [employer] and no back pain. I am having the urgency at night. Patient goes to the bathroom 2 times per night.    Patient Stated Goals  reduce the hip and back pain while managing pelvic floor    Currently in Pain?  Yes    Pain Score  2     Pain Location  Thoracic    Pain Orientation  Mid    Pain Descriptors / Indicators  Aching;Constant    Pain Type  Chronic pain    Pain Onset  More than a month ago    Pain Frequency  Intermittent    Aggravating Factors    standing up straight    Pain Relieving Factors  stretches    Pain Score  4    Pain Location  Bladder    Pain Orientation  Lower    Pain Descriptors / Indicators  Throbbing    Pain Type  Acute pain    Pain Onset  More than a month ago    Pain Frequency  Constant    Aggravating Factors   being on cycle, stress, panic attacks    Pain Relieving Factors  heat pack, taking medication, hot baths         [facility] PT Assessment - 09/30/19 0001      Assessment   Medical Diagnosis  R10.2 Pelvic Pain    Referring Provider (PT)  Dr. Berniece Salines    Onset Date/Surgical Date  06/21/19    Prior Therapy  yes      Precautions   Precautions  None      Restrictions   Weight Bearing Restrictions  No      Posture/Postural Control   Posture/Postural Control  Postural limitations      ROM / Strength   AROM / PROM / Strength  AROM;PROM;Strength  AROM   Lumbar Extension  decreased by 25%    Lumbar - Right Side Bend  decreased by 25%    Lumbar - Left Side Bend  full      Strength   Right Hip ABduction  4-/5    Left Hip ABduction  4-/5      Palpation   SI assessment   ASIS are equal                   [facility] Adult PT Treatment/Exercise - 09/30/19 0001      Lumbar Exercises: Stretches   Press Ups  10 reps      Lumbar Exercises: Aerobic   UBE (Upper Arm Bike)  4 minutes back and forth every minute while monitoring posture at level 1      Lumbar Exercises: Quadruped   Other Quadruped Lumbar Exercises  hand behind head and rotate thoracic spine, 2times each way      Knee/Hip Exercises: Standing   Functional Squat  1 set;10 reps    Functional Squat Limitations  yellow band around knees; working on engaging the gluteus medius      Knee/Hip Exercises: Sidelying   Other Sidelying Knee/Hip Exercises  reverse clam with yellow band      Shoulder Exercises: Prone   External Rotation  Strengthening;Both;10 reps    External Rotation Limitations  squeeze shoulder blades     Horizontal ABduction 1  Strengthening;Both;15 reps    Horizontal ABduction 1 Limitations  palms down; tactile cues to squeeze the shoulder blades together      Manual Therapy   Manual Therapy  Joint mobilization    Joint Mobilization  P-A mobilization to T4-T10 to decreased kyphosis             PT Education - 09/30/19 1303    Education Details  Access Code: [facility]    Person(s) Educated  Patient    Methods  Explanation;Demonstration;Verbal cues;Handout    Comprehension  Verbalized understanding;Returned demonstration       PT Short Term Goals - 09/30/19 1244      PT SHORT TERM GOAL #1   Title  independent with initial HEP    Time  4    Period  Weeks    Status  Achieved        PT Long Term Goals - 09/30/19 1245      PT LONG TERM GOAL #1   Title  independent with HEP    Baseline  educated with new exercises as she progressess    Time  8    Period  Weeks    Status  On-going      PT LONG TERM GOAL #2   Title  improved posture to reduce the thoracic kyphosis so she has reduction in back pain to </= 1/10    Baseline  pain level today is 2/10    Time  8    Period  Weeks    Status  On-going      PT LONG TERM GOAL #3   Title  increased core strength to stabilize her pelvis so the ASIS stay equal for 4 weeks    Baseline  ASIS are equal today, 09/02/2019    Time  8    Period  Weeks    Status  On-going      PT LONG TERM GOAL #4   Title  reduction of bladder pressure >/= 50% of the time due to relaxation of the pelvic floor which will reduce  urgency </= 1 time per week    Baseline  bladder pressure reduced by 50% with urgency 2 times per night    Time  8    Period  Weeks    Status  On-going            Plan - 09/30/19 1258    Clinical Impression Statement  Patient is working on core, hip and back strength to reduce her pain. Patient reports her bladder pressure is 50% better but she gets up 2 times per night to urinate. Patient has been under stress due to  taking exams which increases bladder symptoms. Patient is able to correct her posture but she will sit with flexed spine. Patient has weakness in the hip abductors and tightness in the deep hip rotators. Patient pelvis is staying in correct alignment. Patient will benefit from skilled therpay to reduce pain and improve postural strength.    Personal Factors and Comorbidities  Comorbidity 2    Comorbidities  Interstitial Cystitis, depression    Examination-Activity Limitations  Stand;Sleep    Stability/Clinical Decision Making  Evolving/Moderate complexity    Rehab Potential  Excellent    PT Frequency  1x / week    PT Duration  12 weeks    PT Treatment/Interventions  Cryotherapy;Electrical Stimulation;Moist Heat;Ultrasound;Traction;Neuromuscular re-education;Therapeutic exercise;Therapeutic activities;Patient/family education;Manual techniques;Dry needling;Taping    PT Next Visit Plan  work on hip abduction, give prone scapular exercises; abdominal exercise    PT Home Exercise Plan  Access Code: [facility]    Recommended Other Services  sent MD renewal    Consulted and Agree with Plan of Care  Patient       Patient will benefit from skilled therapeutic intervention in order to improve the following deficits and impairments:  Decreased range of motion, Increased fascial restricitons, Increased muscle spasms, Pain, Postural dysfunction, Decreased strength  Visit Diagnosis: Muscle weakness (generalized)  Cramp and spasm  Abnormal posture     Problem List Patient Active Problem List   Diagnosis Date Noted  . MDD (major depressive disorder), recurrent severe, without psychosis ([place]) 04/19/2018  . Suicide attempt by benzodiazepine overdose ([place]) 04/19/2018    Earlie Counts, PT 09/30/19 1:20 PM   [facility] [address], [place], [place], [address] Phone: [phone]   Fax:  [phone]  Name: HEDWIG MCFALL MRN:  [id number] Date of Birth: Dec 25, 2000

## 2019-09-30 NOTE — Patient Instructions (Signed)
Access Code: [facility] URL: [email or web address] Date: 08/05/2019 Prepared by: Eulis Foster  Exercises Child's Pose Stretch - 1 x daily - 7 x weekly - 1 reps - 1 sets - 30 sec hold Supine Lower Trunk Rotation - 1 x daily - 7 x weekly - 2 reps - 1 sets - 30 sec hold Supine Pelvic Floor Stretch - 1 x daily - 7 x weekly - 1 reps - 1 sets - 1 min hold Supine Diaphragmatic Breathing with Pelvic Floor Lengthening - 2 x daily - 7 x weekly - 10 reps - 1 sets Pigeon Pose - 1 x daily - 7 x weekly - 2 reps - 1 sets - 30 sec hold Prone Hip Extension - Two Pillows - 1 x daily - 7 x weekly - 10 reps - 2 sets Prone Hip Extension with Mini Swiss Ball - 1 x daily - 7 x weekly - 10 reps - 1 sets Sidelying Reverse Clamshell with Resistance - 1 x daily - 7 x weekly - 1 sets - 10 reps Squat with Resistance at Thighs - 1 x daily - 7 x weekly - 1 sets - 10 reps [facility] [address] [place], [place] [address] Phone # [phone] Fax [phone]

## 2019-10-07 ENCOUNTER — Encounter: Payer: Self-pay | Admitting: Physical Therapy

## 2019-10-07 ENCOUNTER — Other Ambulatory Visit: Payer: Self-pay

## 2019-10-07 ENCOUNTER — Ambulatory Visit: Payer: Medicaid Other | Admitting: Physical Therapy

## 2019-10-07 DIAGNOSIS — M6281 Muscle weakness (generalized): Secondary | ICD-10-CM

## 2019-10-07 DIAGNOSIS — R293 Abnormal posture: Secondary | ICD-10-CM

## 2019-10-07 DIAGNOSIS — R252 Cramp and spasm: Secondary | ICD-10-CM

## 2019-10-07 NOTE — Therapy (Signed)
[facility] [address], [place], [place], [address] Phone: [phone]   Fax:  [phone]  Physical Therapy Treatment  Patient Details  Name: Alexis [name] MRN: [id number] Date of Birth: 2001-05-13 Referring Provider (PT): Dr. Louis Meckel   Encounter Date: 10/07/2019  PT End of Session - 10/07/19 1313    Visit Number  8    Date for PT Re-Evaluation  12/23/19    Authorization Type  Medicaid    Authorization Time Period  08/26/2019-11/17/2019    Authorization - Visit Number  5    Authorization - Number of Visits  12    PT Start Time  1224    PT Stop Time  1309    PT Time Calculation (min)  39 min    Activity Tolerance  Patient tolerated treatment well;No increased pain    Behavior During Therapy  WFL for tasks assessed/performed       Past Medical History:  Diagnosis Date  . Anxiety   . Asthma   . Depression   . GERD (gastroesophageal reflux disease)   . Migraines     Past Surgical History:  Procedure Laterality Date  . CYSTO WITH HYDRODISTENSION N/A 04/10/2018   Procedure: CYSTOSCOPY/HYDRODISTENSION;  Surgeon: Ardis Hughs, MD;  Location: [facility];  Service: Urology;  Laterality: N/A;  . UPPER GI ENDOSCOPY      There were no vitals filed for this visit.  Subjective Assessment - 10/07/19 1239    Subjective  My neck and upper back bothers me especially at night. NO pain in the low back.    Patient Stated Goals  reduce the hip and back pain while managing pelvic floor    Currently in Pain?  Yes    Pain Score  3    cervical 4/10   Pain Location  Thoracic    Pain Orientation  Mid    Pain Descriptors / Indicators  Aching    Pain Type  Chronic pain    Pain Onset  More than a month ago    Pain Frequency  Intermittent    Aggravating Factors   standing up straight    Pain Relieving Factors  stretches    Multiple Pain Sites  No                        [facility]  Adult PT Treatment/Exercise - 10/07/19 0001      Lumbar Exercises: Aerobic   Stationary Bike  7 minutes, level 3 while assess patient and to build endurance    UBE (Upper Arm Bike)  4 minutes back and forth every minute while monitoring posture at level 1      Shoulder Exercises: Seated   Other Seated Exercises  lat bar 25# 2x10; sit with scapula retraction 1# 10x with tactile cues to retract the scapula    Other Seated Exercises  seated mid row 15# 10x with tactile cues to not flex thoracic, overhead press 5#10x      Manual Therapy   Manual Therapy  Joint mobilization;Soft tissue mobilization    Joint Mobilization  P-A mobilization to T4-T10 to decreased kyphosis    Soft tissue mobilization  thoracic paraspinals       Trigger Point Dry Needling - 10/07/19 0001    Consent Given?  Yes    Education Handout Provided  Previously provided    Muscles Treated Back/Hip  Thoracic multifidi    Thoracic multifidi response  Twitch response elicited;Palpable increased muscle length           PT Education - 10/07/19 1312    Education Details  went over equipment she can use at the gym and using correct posture    Person(s) Educated  Patient    Methods  Explanation;Demonstration    Comprehension  Verbalized understanding;Returned demonstration       PT Short Term Goals - 09/30/19 1244      PT SHORT TERM GOAL #1   Title  independent with initial HEP    Time  4    Period  Weeks    Status  Achieved        PT Long Term Goals - 09/30/19 1245      PT LONG TERM GOAL #1   Title  independent with HEP    Baseline  educated with new exercises as she progressess    Time  8    Period  Weeks    Status  On-going      PT LONG TERM GOAL #2   Title  improved posture to reduce the thoracic kyphosis so she has reduction in back pain to </= 1/10    Baseline  pain level today is 2/10    Time  8    Period  Weeks    Status  On-going      PT LONG TERM GOAL #3   Title  increased core strength  to stabilize her pelvis so the ASIS stay equal for 4 weeks    Baseline  ASIS are equal today, 09/02/2019    Time  8    Period  Weeks    Status  On-going      PT LONG TERM GOAL #4   Title  reduction of bladder pressure >/= 50% of the time due to relaxation of the pelvic floor which will reduce urgency </= 1 time per week    Baseline  bladder pressure reduced by 50% with urgency 2 times per night    Time  8    Period  Weeks    Status  On-going            Plan - 10/07/19 1315    Clinical Impression Statement  Patient reports she is getting up in the night to urinate. Patient is not having low back pain just midback and cervical. Patient has not said anything about her bladder pain today. Patietnt is learning how to strengthen her back with correct posture. Patient has tightness in her thoracic area due to flexing in that area during the day. Patient will benefit from skilled therapy to reduce pain and improve postural strength.    Personal Factors and Comorbidities  Comorbidity 2    Comorbidities  Interstitial Cystitis, depression    Examination-Activity Limitations  Stand;Sleep    Stability/Clinical Decision Making  Evolving/Moderate complexity    Rehab Potential  Excellent    PT Frequency  1x / week    PT Duration  12 weeks    PT Treatment/Interventions  Cryotherapy;Electrical Stimulation;Moist Heat;Ultrasound;Traction;Neuromuscular re-education;Therapeutic exercise;Therapeutic activities;Patient/family education;Manual techniques;Dry needling;Taping    PT Next Visit Plan  continue to work on gym equipmet to go over proper techinque    PT Home Exercise Plan  Access Code: [facility]    Recommended Other Services  MD signed initial and renewal    Consulted and Agree with Plan of Care  Patient       Patient will benefit from skilled therapeutic intervention in order to improve the  following deficits and impairments:  Decreased range of motion, Increased fascial restricitons, Increased  muscle spasms, Pain, Postural dysfunction, Decreased strength  Visit Diagnosis: Muscle weakness (generalized)  Cramp and spasm  Abnormal posture     Problem List Patient Active Problem List   Diagnosis Date Noted  . MDD (major depressive disorder), recurrent severe, without psychosis ([facility]) 04/19/2018  . Suicide attempt by benzodiazepine overdose ([facility]) 04/19/2018    Eulis Foster, PT 10/07/19 1:21 PM   [facility] [address], [address] [place], [place], [address] Phone: [phone]   Fax:  [phone]  Name: Alexis [name] MRN: [id number] Date of Birth: February 24, 2001

## 2019-10-14 ENCOUNTER — Encounter: Payer: Self-pay | Admitting: Physical Therapy

## 2019-10-14 ENCOUNTER — Ambulatory Visit: Payer: Medicaid Other | Admitting: Physical Therapy

## 2019-10-14 ENCOUNTER — Other Ambulatory Visit: Payer: Self-pay

## 2019-10-14 DIAGNOSIS — R252 Cramp and spasm: Secondary | ICD-10-CM

## 2019-10-14 DIAGNOSIS — M6281 Muscle weakness (generalized): Secondary | ICD-10-CM

## 2019-10-14 DIAGNOSIS — R293 Abnormal posture: Secondary | ICD-10-CM

## 2019-10-14 NOTE — Therapy (Signed)
[facility] [address] [place], [place], [address] Phone: [phone]   Fax:  [phone]  Physical Therapy Treatment  Patient Details  Name: Alexis [name] MRN: [id number] Date of Birth: [birth year] Referring Provider (PT): Dr. Berniece Salines   Encounter Date: 10/14/2019  PT End of Session - 10/14/19 1241    Visit Number  9    Date for PT Re-Evaluation  12/23/19    Authorization Type  Medicaid    Authorization Time Period  08/26/2019-11/17/2019    Authorization - Visit Number  6    Authorization - Number of Visits  12    PT Start Time  1230    PT Stop Time  1308    PT Time Calculation (min)  38 min    Activity Tolerance  Patient tolerated treatment well;No increased pain    Behavior During Therapy  WFL for tasks assessed/performed       Past Medical History:  Diagnosis Date  . Anxiety   . Asthma   . Depression   . GERD (gastroesophageal reflux disease)   . Migraines     Past Surgical History:  Procedure Laterality Date  . CYSTO WITH HYDRODISTENSION N/A 04/10/2018   Procedure: CYSTOSCOPY/HYDRODISTENSION;  Surgeon: Crist Fat, MD;  Location: [facility];  Service: Urology;  Laterality: N/A;  . UPPER GI ENDOSCOPY      There were no vitals filed for this visit.  Subjective Assessment - 10/14/19 1237    Subjective  I still have upper back pain. I get anxiety and then I will have to go to the bathroom more often. I almost never have low back pain. The stretches help my back. I plan to go to the gym this week.    Patient Stated Goals  reduce the hip and back pain while managing pelvic floor    Currently in Pain?  Yes    Pain Score  4     Pain Location  Thoracic    Pain Orientation  Mid    Pain Descriptors / Indicators  Aching    Pain Onset  More than a month ago    Pain Frequency  Intermittent    Aggravating Factors   standing up straight    Pain Relieving Factors  stretches                         [facility] Adult PT Treatment/Exercise - 10/14/19 0001      Lumbar Exercises: Aerobic   Stationary Bike  8 minutes, level 3 while assess patient and to build endurance      Lumbar Exercises: Machines for Strengthening   Cybex Knee Extension  15# 20 x    Cybex Knee Flexion  15# 20x    Leg Press  seat #6, 2 legs, 75# 15x      Lumbar Exercises: Prone   Other Prone Lumbar Exercises  prone on red ball and alternate shoulder flexion working on thoracic extension      Knee/Hip Exercises: Standing   Other Standing Knee Exercises  bicep curls 3# with VC to stand upright; bil. shoulder abduction to 45 degree 3#;       Shoulder Exercises: Seated   Other Seated Exercises  lat bar 20# 2x10; sit with scapula retraction 1# 10x with tactile cues to retract the scapula    Other Seated Exercises  seated mid row 15# 10x with tactile cues to not flex thoracic,  Shoulder Exercises: Power Development worker, community  15 reps    Extension Limitations  5# with constant tactile cues to retract the shoulder and lift chest    Row  15 reps    Row Limitations  25#    Other Power Tower Exercises  tricep 15# with tactile cues on posture and keeping elbows at her side       Trigger Point Dry Needling - 10/14/19 0001    Consent Given?  Yes    Education Handout Provided  Previously provided    Muscles Treated Back/Hip  Thoracic multifidi   T4-T8   Thoracic multifidi response  Twitch response elicited;Palpable increased muscle length           PT Education - 10/14/19 1315    Education Details  eduated patient on how to exercise at the gym to workout and correct posture    Person(s) Educated  Patient    Methods  Explanation;Demonstration;Verbal cues;Handout    Comprehension  Verbalized understanding;Returned demonstration       PT Short Term Goals - 09/30/19 1244      PT SHORT TERM GOAL #1   Title  independent with initial HEP    Time  4    Period  Weeks    Status   Achieved        PT Long Term Goals - 10/14/19 1323      PT LONG TERM GOAL #1   Title  independent with HEP    Baseline  educated with new exercises as she progressess    Time  8    Period  Weeks    Status  On-going      PT LONG TERM GOAL #2   Title  improved posture to reduce the thoracic kyphosis so she has reduction in back pain to </= 1/10    Baseline  pain level today is 2/10    Time  8    Period  Weeks    Status  On-going      PT LONG TERM GOAL #3   Title  increased core strength to stabilize her pelvis so the ASIS stay equal for 4 weeks    Baseline  ASIS are equal today, 09/02/2019    Time  8    Period  Weeks    Status  On-going      PT LONG TERM GOAL #4   Title  reduction of bladder pressure >/= 50% of the time due to relaxation of the pelvic floor which will reduce urgency </= 1 time per week    Baseline  bladder pressure reduced by 50% with urgency 2 times per night    Time  8    Status  On-going            Plan - 10/14/19 1316    Clinical Impression Statement  Patient notice her urgency is when she is has anxiety. Patient needs tactile cues and verbal cues to increase the thoracic extension and scapula retraction. Patient understands how to exercise at the gym and how to correct her posture while exercising. Patient has not had any lumbar pain in several weeks. Patient will benefit from skilled therapy to reduce pain and improve postural strength.    Personal Factors and Comorbidities  Comorbidity 2    Comorbidities  Interstitial Cystitis, depression    Examination-Activity Limitations  Stand;Sleep    Stability/Clinical Decision Making  Evolving/Moderate complexity    Rehab Potential  Excellent    PT Frequency  1x / week    PT Duration  12 weeks    PT Treatment/Interventions  Cryotherapy;Electrical Stimulation;Moist Heat;Ultrasound;Traction;Neuromuscular re-education;Therapeutic exercise;Therapeutic activities;Patient/family education;Manual techniques;Dry  needling;Taping    PT Next Visit Plan  continue to work on gym equipmet to go over proper techinque; uf doing well then discharge    PT Home Exercise Plan  Access Code: [facility]    Consulted and Agree with Plan of Care  Patient       Patient will benefit from skilled therapeutic intervention in order to improve the following deficits and impairments:  Decreased range of motion, Increased fascial restricitons, Increased muscle spasms, Pain, Postural dysfunction, Decreased strength  Visit Diagnosis: Muscle weakness (generalized)  Cramp and spasm  Abnormal posture     Problem List Patient Active Problem List   Diagnosis Date Noted  . MDD (major depressive disorder), recurrent severe, without psychosis (HCC) 04/19/2018  . Suicide attempt by benzodiazepine overdose (HCC) 04/19/2018    Eulis Foster, PT 10/14/19 1:24 PM   [facility] [address] [place], [place], [address] Phone: [phone]   Fax:  [phone]  Name: NAYELI CALVERT MRN: [id number] Date of Birth: [birth year]

## 2019-10-21 ENCOUNTER — Ambulatory Visit: Payer: Medicaid Other | Admitting: Physical Therapy

## 2019-10-21 ENCOUNTER — Other Ambulatory Visit: Payer: Self-pay

## 2019-10-21 ENCOUNTER — Encounter: Payer: Self-pay | Admitting: Physical Therapy

## 2019-10-21 DIAGNOSIS — R293 Abnormal posture: Secondary | ICD-10-CM

## 2019-10-21 DIAGNOSIS — M6281 Muscle weakness (generalized): Secondary | ICD-10-CM | POA: Diagnosis not present

## 2019-10-21 DIAGNOSIS — R252 Cramp and spasm: Secondary | ICD-10-CM

## 2019-10-21 NOTE — Therapy (Signed)
[facility] [address], [place], [place], [address] Phone: [phone]   Fax:  [phone]  Physical Therapy Treatment  Patient Details  Name: Alexis [name] MRN: [id number] Date of Birth: 2000/10/01 Referring Provider (PT): Dr. Louis Meckel   Encounter Date: 10/21/2019  PT End of Session - 10/21/19 1558    Visit Number  10    Date for PT Re-Evaluation  12/23/19    Authorization Type  Medicaid    Authorization Time Period  08/26/2019-11/17/2019    Authorization - Visit Number  7    Authorization - Number of Visits  12    PT Start Time  3903    PT Stop Time  1308    PT Time Calculation (min)  38 min    Activity Tolerance  Patient tolerated treatment well;No increased pain    Behavior During Therapy  WFL for tasks assessed/performed       Past Medical History:  Diagnosis Date  . Anxiety   . Asthma   . Depression   . GERD (gastroesophageal reflux disease)   . Migraines     Past Surgical History:  Procedure Laterality Date  . CYSTO WITH HYDRODISTENSION N/A 04/10/2018   Procedure: CYSTOSCOPY/HYDRODISTENSION;  Surgeon: Ardis Hughs, MD;  Location: [facility];  Service: Urology;  Laterality: N/A;  . UPPER GI ENDOSCOPY      There were no vitals filed for this visit.  Subjective Assessment - 10/21/19 1600    Subjective  No pain just urgency. I have not gone to the gym but I did the exercises at home. I do not have urgency with driving just at night.    Patient Stated Goals  reduce the hip and back pain while managing pelvic floor    Currently in Pain?  No/denies         [facility] PT Assessment - 10/21/19 0001      Assessment   Medical Diagnosis  R10.2 Pelvic Pain    Referring Provider (PT)  Dr. Louis Meckel    Onset Date/Surgical Date  06/21/19    Prior Therapy  yes      Precautions   Precautions  None      Restrictions   Weight Bearing Restrictions  No      Cognition    Overall Cognitive Status  Within Functional Limits for tasks assessed      Posture/Postural Control   Posture/Postural Control  Postural limitations    Posture Comments  very mild thoracic kyphosis and she is able to correct it      AROM   Lumbar Extension  full    Lumbar - Right Side Bend  full    Lumbar - Left Side Bend  full      Strength   Right Hip ABduction  4/5    Left Hip ABduction  4/5      Palpation   SI assessment   ASIS are equal                    [facility] Adult PT Treatment/Exercise - 10/21/19 0001      Lumbar Exercises: Aerobic   UBE (Upper Arm Bike)  4 minutes back and 4 min forwardwhile monitoring posture at level 2      Lumbar Exercises: Machines for Strengthening   Cybex Knee Extension  15# 20 x    Cybex Knee Flexion  15# 20x    Leg Press  seat #6, 2 legs,  75# 15x      Shoulder Exercises: Seated   Other Seated Exercises  lat bar 20# 2x10; sit with scapula retraction 1# 10x with tactile cues to retract the scapula    Other Seated Exercises  seated mid row 15# 10x with tactile cues to not flex thoracic,                PT Short Term Goals - 09/30/19 1244      PT SHORT TERM GOAL #1   Title  independent with initial HEP    Time  4    Period  Weeks    Status  Achieved        PT Long Term Goals - 10/21/19 1604      PT LONG TERM GOAL #1   Title  independent with HEP    Time  8    Period  Weeks    Status  Achieved      PT LONG TERM GOAL #2   Title  improved posture to reduce the thoracic kyphosis so she has reduction in back pain to </= 1/10    Time  8    Period  Weeks    Status  Achieved      PT LONG TERM GOAL #3   Title  increased core strength to stabilize her pelvis so the ASIS stay equal for 4 weeks    Time  8    Period  Weeks    Status  Achieved      PT LONG TERM GOAL #4   Title  reduction of bladder pressure >/= 50% of the time due to relaxation of the pelvic floor which will reduce urgency </= 1 time per week     Time  8    Period  Weeks    Status  Achieved            Plan - 10/21/19 1603    Clinical Impression Statement  : Patient has met her goals. She can sit upright with thoracic kyphosis reduced significantly.  Full lumbar ROM. Bilateral hip strength is 5/5 with exception of abduction 4/5. No urgency with driving just at night. She is using techniques including diaphragmatic breathing to calm the bladder to reduce the urge. She understands how to exercise at the gym to work on postural muscles and overall strength. Patient is not complaining of pain in her back today. She is aware of her posture and understands how to correct it. She is ready for discharge.    Personal Factors and Comorbidities  Comorbidity 2    Comorbidities  Interstitial Cystitis, depression    Examination-Activity Limitations  Stand;Sleep    Stability/Clinical Decision Making  Evolving/Moderate complexity    Rehab Potential  Excellent    PT Treatment/Interventions  Cryotherapy;Electrical Stimulation;Moist Heat;Ultrasound;Traction;Neuromuscular re-education;Therapeutic exercise;Therapeutic activities;Patient/family education;Manual techniques;Dry needling;Taping    PT Next Visit Plan  Discharge to HEP    PT Home Exercise Plan  Access Code: [facility]    Consulted and Agree with Plan of Care  Patient       Patient will benefit from skilled therapeutic intervention in order to improve the following deficits and impairments:  Decreased range of motion, Increased fascial restricitons, Increased muscle spasms, Pain, Postural dysfunction, Decreased strength  Visit Diagnosis: Muscle weakness (generalized)  Cramp and spasm  Abnormal posture     Problem List Patient Active Problem List   Diagnosis Date Noted  . MDD (major depressive disorder), recurrent severe, without psychosis ([place]) 04/19/2018  . Suicide  attempt by benzodiazepine overdose ([name]) 04/19/2018    Earlie Counts, PT 10/21/19 4:08 PM   [facility] [address], [place], [place], [address] Phone: [phone]   Fax:  [phone]  Name: Alexis [name] MRN: [id number] Date of Birth: 02-04-2001 PHYSICAL THERAPY DISCHARGE SUMMARY  Visits from Start of Care: 10  Current functional level related to goals / functional outcomes: See above.    Remaining deficits: See above.   Education / Equipment: HEP Plan: Patient agrees to discharge.  Patient goals were met. Patient is being discharged due to meeting the stated rehab goals. Thank you for the referral. Earlie Counts, PT 10/21/19 4:10 PM   ?????

## 2019-10-26 DIAGNOSIS — Z825 Family history of asthma and other chronic lower respiratory diseases: Secondary | ICD-10-CM | POA: Insufficient documentation

## 2019-11-04 ENCOUNTER — Encounter: Payer: Medicaid Other | Admitting: Physical Therapy

## 2020-01-16 ENCOUNTER — Other Ambulatory Visit: Payer: Self-pay

## 2020-01-16 ENCOUNTER — Ambulatory Visit
Admission: EM | Admit: 2020-01-16 | Discharge: 2020-01-16 | Disposition: A | Discharge status: Home / Self Care | Payer: Medicaid Other

## 2020-01-16 DIAGNOSIS — N760 Acute vaginitis: Secondary | ICD-10-CM | POA: Diagnosis not present

## 2020-01-16 MED ORDER — FLUCONAZOLE 150 MG PO TABS: 150.0000 mg | ORAL_TABLET | Freq: Every day | ORAL | 0 refills | Status: DC

## 2020-01-16 NOTE — ED Triage Notes (Signed)
Pt here for evaluation of vaginal irritation, discharge and discomfort that started on Friday.

## 2020-01-16 NOTE — Discharge Instructions (Signed)
Start diflucan for yeast. Refrain from sexual activity for the next 7 days. Monitor for any worsening of symptoms, fever, abdominal pain, nausea, vomiting, to follow up for reevaluation.

## 2020-01-16 NOTE — ED Provider Notes (Signed)
[name]    CSN: [id number] Arrival date & time: 01/16/20  1019      History   Chief Complaint Chief Complaint  Patient presents with  . Vaginal Itching    HPI Alexis [name] is a 19 y.o. female.   19 year old female comes in for few day history of vaginal irritation/itching. Has clumpy discharge. No odor. States has baseline urinary symptoms, has had more irritation to the vaginal area with urinating. Denies fever, chills, body aches. Denies abdominal pain, nausea, vomiting. LMP 12/26/2019. Sexually active with 1 female partner. Recently changed soaps     Past Medical History:  Diagnosis Date  . Anxiety   . Asthma   . Depression   . GERD (gastroesophageal reflux disease)   . Migraines     Patient Active Problem List   Diagnosis Date Noted  . MDD (major depressive disorder), recurrent severe, without psychosis (HCC) 04/19/2018  . Suicide attempt by benzodiazepine overdose (HCC) 04/19/2018    Past Surgical History:  Procedure Laterality Date  . CYSTO WITH HYDRODISTENSION N/A 04/10/2018   Procedure: CYSTOSCOPY/HYDRODISTENSION;  Surgeon: Crist Fat, MD;  Location: [facility];  Service: Urology;  Laterality: N/A;  . UPPER GI ENDOSCOPY      OB History   No obstetric history on file.      Home Medications    Prior to Admission medications   Medication Sig Start Date End Date Taking? Authorizing Provider  fluticasone (FLOVENT HFA) 110 MCG/ACT inhaler Inhale into the lungs. 05/03/19  Yes [provider]  albuterol (PROVENTIL HFA;VENTOLIN HFA) 108 (90 Base) MCG/ACT inhaler Inhale 1-2 puffs into the lungs every 6 (six) hours as needed for wheezing or shortness of breath. 04/22/18   Armandina Stammer I, NP  diazepam (VALIUM) 10 MG tablet Take 1 tablet (10 mg total) by mouth at bedtime. For severe anxiety 04/22/18   Armandina Stammer I, NP  escitalopram (LEXAPRO) 10 MG tablet Take 10 mg by mouth daily.    [provider]    famotidine (PEPCID) 20 MG tablet Take 1 tablet (20 mg total) by mouth daily. For acid reflux Patient not taking: Reported on 07/22/2019 04/23/18   Armandina Stammer I, NP  fluconazole (DIFLUCAN) 150 MG tablet Take 1 tablet (150 mg total) by mouth daily. Take second dose 72 hours later if symptoms still persists. 01/16/20   Cathie Hoops, Yomar Mejorado V, PA-C  hydrOXYzine (ATARAX/VISTARIL) 25 MG tablet Take 1 tablet (25 mg total) by mouth every 8 (eight) hours as needed for anxiety. 04/22/18   Armandina Stammer I, NP  lamoTRIgine (LAMICTAL) 25 MG tablet Take 100 mg by mouth at bedtime. 11/05/19   [provider]  meloxicam (MOBIC) 15 MG tablet Take 1 tablet (15 mg total) by mouth daily. For pain 04/22/18   Armandina Stammer I, NP  mirabegron ER (MYRBETRIQ) 25 MG TB24 tablet Take 25 mg by mouth daily.    [provider]  Norgestimate-Ethinyl Estradiol Triphasic (TRI-LO-MARZIA) 0.18/0.215/0.25 MG-25 MCG tab Take 1 tablet by mouth at bedtime. Birth control method 04/22/18   Armandina Stammer I, NP  phenazopyridine (PYRIDIUM) 100 MG tablet Take 1 tablet (100 mg total) by mouth 3 (three) times daily. For bladder spasms 04/22/18   Armandina Stammer I, NP  phenazopyridine (PYRIDIUM) 200 MG tablet Take 1 tablet (200 mg total) by mouth daily as needed (urinary symptoms). 04/22/18   Armandina Stammer I, NP  sertraline (ZOLOFT) 25 MG tablet Take 1 tablet (25 mg total) by mouth daily.  For depression Patient not taking: Reported on 07/22/2019 04/23/18   Armandina Stammer I, NP  traMADol (ULTRAM) 50 MG tablet Take 1 tablet (50 mg total) by mouth every 8 (eight) hours as needed for moderate pain. 04/22/18   Sanjuana Kava, NP    Family History Family History  Problem Relation Age of Onset  . Asthma Mother   . Thyroid disease Mother   . Healthy Sister     Social History Social History   Tobacco Use  . Smoking status: Never Smoker  . Smokeless tobacco: Never Used  Vaping Use  . Vaping Use: Never used  Substance Use Topics  . Alcohol use:  Never  . Drug use: Never     Allergies   Other   Review of Systems Review of Systems  Reason unable to perform ROS: See HPI as above.     Physical Exam Triage Vital Signs ED Triage Vitals  Enc Vitals Group     BP 01/16/20 1108 123/82     Pulse Rate 01/16/20 1108 87     Resp 01/16/20 1108 16     Temp 01/16/20 1108 98.2 F (36.8 C)     Temp Source 01/16/20 1108 Oral     SpO2 01/16/20 1108 98 %     Weight --      Height --      Head Circumference --      Peak Flow --      Pain Score 01/16/20 1123 4     Pain Loc --      Pain Edu? --      Excl. in GC? --    No data found.  Updated Vital Signs BP 123/82 (BP Location: Left Arm)   Pulse 87   Temp 98.2 F (36.8 C) (Oral)   Resp 16   LMP 12/26/2019 (Approximate)   SpO2 98%   Physical Exam Constitutional:      General: She is not in acute distress.    Appearance: Normal appearance. She is well-developed. She is not toxic-appearing or diaphoretic.  HENT:     Head: Normocephalic and atraumatic.  Eyes:     Conjunctiva/sclera: Conjunctivae normal.     Pupils: Pupils are equal, round, and reactive to light.  Pulmonary:     Effort: Pulmonary effort is normal. No respiratory distress.  Musculoskeletal:     Cervical back: Normal range of motion and neck supple.  Skin:    General: Skin is warm and dry.  Neurological:     Mental Status: She is alert and oriented to person, place, and time.      UC Treatments / Results  Labs (all labs ordered are listed, but only abnormal results are displayed) Labs Reviewed - No data to display  EKG   Radiology No results found.  Procedures Procedures (including critical care time)  Medications Ordered in UC Medications - No data to display  Initial Impression / Assessment and Plan / UC Course  I have reviewed the triage vital signs and the nursing notes.  Pertinent labs & imaging results that were available during my care of the patient were reviewed by me and  considered in my medical decision making (see chart for details).    Patient without concerns for STD, cytology deferred. Will treat for yeast with diflucan. Return precautions given.  Final Clinical Impressions(s) / UC Diagnoses   Final diagnoses:  Acute vaginitis    ED Prescriptions    Medication Sig Dispense Auth. Provider   fluconazole (  DIFLUCAN) 150 MG tablet Take 1 tablet (150 mg total) by mouth daily. Take second dose 72 hours later if symptoms still persists. 2 tablet Belinda Fisher, PA-C     PDMP not reviewed this encounter.   Belinda Fisher, PA-C 01/16/20 1138

## 2020-03-02 ENCOUNTER — Other Ambulatory Visit: Payer: Self-pay

## 2020-03-02 ENCOUNTER — Ambulatory Visit: Payer: Medicaid Other | Attending: Urology | Admitting: Physical Therapy

## 2020-03-02 ENCOUNTER — Encounter: Payer: Self-pay | Admitting: Physical Therapy

## 2020-03-02 DIAGNOSIS — R293 Abnormal posture: Secondary | ICD-10-CM | POA: Diagnosis present

## 2020-03-02 DIAGNOSIS — M6281 Muscle weakness (generalized): Secondary | ICD-10-CM | POA: Diagnosis present

## 2020-03-02 DIAGNOSIS — R252 Cramp and spasm: Secondary | ICD-10-CM | POA: Diagnosis present

## 2020-03-02 NOTE — Therapy (Signed)
[facility] [address] [place], [place], [address] Phone: [phone]   Fax:  [phone]  Physical Therapy Evaluation  Patient Details  Name: Alexis [name] MRN: [id number] Date of Birth: [birth year] Referring Provider (PT): Burman Foster, NP   Encounter Date: 03/02/2020   PT End of Session - 03/02/20 1101    Visit Number 1    Date for PT Re-Evaluation 05/25/20    Authorization Type Medicaid blue    Authorization - Visit Number 1    Authorization - Number of Visits 27    PT Start Time 1015    PT Stop Time 1055    PT Time Calculation (min) 40 min    Activity Tolerance Patient tolerated treatment well;No increased pain    Behavior During Therapy WFL for tasks assessed/performed           Past Medical History:  Diagnosis Date  . Anxiety   . Asthma   . Depression   . GERD (gastroesophageal reflux disease)   . Migraines     Past Surgical History:  Procedure Laterality Date  . CYSTO WITH HYDRODISTENSION N/A 04/10/2018   Procedure: CYSTOSCOPY/HYDRODISTENSION;  Surgeon: Crist Fat, MD;  Location: [facility];  Service: Urology;  Laterality: N/A;  . UPPER GI ENDOSCOPY      There were no vitals filed for this visit.    Subjective Assessment - 03/02/20 1021    Subjective Patient reports the symptoms began 01/2020. The issue is pain during intercourse. Sometimes I will bleed with intercourse. Patient is having burning with urination and pelvic pain, middle of back and shoulders. Patient stopped exercises due to being depressed.    Currently in Pain? Yes    Pain Score 3     Pain Location Vagina    Pain Orientation Mid    Pain Descriptors / Indicators Burning;Sharp    Pain Type Acute pain    Pain Onset More than a month ago    Pain Frequency Intermittent    Aggravating Factors  urination 3/10, intercourse 5/10, eat something that not suppose to, stress    Pain Relieving Factors  not urinating    Effect of Pain on Daily Activities reduce pain during intercourse, reduce back pain and strengthen the muscles    Multiple Pain Sites Yes    Pain Score 5    Pain Location Back    Pain Orientation Mid;Lower;Upper    Pain Descriptors / Indicators Dull    Pain Type Acute pain    Pain Onset More than a month ago    Pain Frequency Constant    Aggravating Factors  not exercising; lifting things at [employer], sitting long period of time, not having correct posture    Pain Relieving Factors stretches, heat, bath, correct posture              [facility] PT Assessment - 03/02/20 0001      Assessment   Medical Diagnosis chronic cystitis(w/o) N30.20; Dysuria R30.0; Pelvic/Perineal pain R10.2; Urinary Frequency R35.0    Referring Provider (PT) Burman Foster, NP    Onset Date/Surgical Date --   12/26/2019   Prior Therapy yes      Precautions   Precautions None      Restrictions   Weight Bearing Restrictions No      Balance Screen   Has the patient fallen in the past 6 months No    Has the patient had a decrease in activity level because  of a fear of falling?  No    Is the patient reluctant to leave their home because of a fear of falling?  No      Home [employer] managernvironment   Living Environment Private residence      Prior Function   Level of Independence Independent    Vocation Student    Leisure Modeling but taking a break for school      Cognition   Overall Cognitive Status Within Functional Limits for tasks assessed      Posture/Postural Control   Posture/Postural Control Postural limitations    Postural Limitations Forward head;Rounded Shoulders;Increased thoracic kyphosis      ROM / Strength   AROM / PROM / Strength AROM;PROM;Strength      AROM   Overall AROM Comments Lumbar ROM is full      Strength   Overall Strength Comments middle and lower  trap bil. 3/5    Right Hip ABduction 4-/5    Right Hip ADduction 3+/5    Left Hip External Rotation 3+/5    Left Hip  ABduction 4-/5    Left Hip ADduction 3+/5      Palpation   Spinal mobility T3-T0 decreased movement    SI assessment  ASIS is equal    Palpation comment tenderness located lower abdominal area                      Objective measurements completed on examination: See above findings.     Pelvic Floor Special Questions - 03/02/20 0001    Currently Sexually Active Yes    Is this Painful Yes   deep penetration   Marinoff Scale discomfort that does not affect completion    Skin Integrity Intact    External Palpation burning pain around the introitus    Pelvic Floor Internal Exam Patient confirms identification and approves PT to assess pelvic floor and treatment    Exam Type Vaginal    Palpation tenderness located in perineal body, bulbocavernsosis, levator ani, sides of bladder, obturator internist    Strength good squeeze, good lift, able to hold agaisnt strong resistance            [facility] Adult PT Treatment/Exercise - 03/02/20 0001      Self-Care   Self-Care Other Self-Care Comments    Other Self-Care Comments  discussed with patient on vaginal care with using coconut oil on the vulvar area, not washing the vulva with soap      Neuro Re-ed    Neuro Re-ed Details  diaphragmatic breathing to expand the pelvic floro but had difficulty and needed tactile cues                  PT Education - 03/02/20 1101    Education Details educated patient on vulva care    Person(s) Educated Patient    Methods Explanation    Comprehension Verbalized understanding            PT Short Term Goals - 03/02/20 1111      PT SHORT TERM GOAL #1   Title independent with initial HEP    Time 4    Period Weeks    Status New    Target Date 03/30/20             PT Long Term Goals - 03/02/20 1111      PT LONG TERM GOAL #1   Title independent with HEP    Baseline not educated yet  Time 12    Period Weeks    Status New    Target Date 05/25/20      PT LONG TERM  GOAL #2   Title improved posture to reduce the thoracic kyphosis so she has reduction in back pain to </= 1/10    Baseline pain level 5/10    Time 12    Period Weeks    Status New    Target Date 05/25/20      PT LONG TERM GOAL #3   Title burning with urination decreased to </= 1/10 due to improved vaginal health and elongation of tissue    Baseline pain level is 5/10    Time 12    Period Weeks    Status New    Target Date 05/25/20      PT LONG TERM GOAL #4   Title pain with penile penetraction decreased </= 1/10 due to improved elongation of the pelvic floor muscles    Baseline pain level is 5/10    Time 12    Period Weeks    Status New    Target Date 05/25/20                  Plan - 03/02/20 1102    Clinical Impression Statement Patient is a 19 year old female with pelvic pain, frequent urination, and Dysuria since 12/26/2019. Patient has Cystitis and had lemon in a meal that flared her up. Patient was in therapy in the past for similiar symptoms that felt better in June 2021. Patient reports her pain level is 3/10 with urination, 5/10 with penile penetration, and back pain is 5/10. Lumbar ROM is decreased by 25%. Weakness in bilateral hip abduction and adduction. Decreased movement of T3-T10. Pelvic floor strength is 4/4. Tenderness located in lower abdomin, bulbocavernosus, perineal body, levator ani, obturator internist, and side of bladder. Patient will benefit from skilled therapy to reduce her pain and improve her strength to reduce pelvic pain and back pain.    Personal Factors and Comorbidities Sex;Comorbidity 1    Comorbidities Interstitial Cystitis    Examination-Activity Limitations Toileting;Sit    Examination-Participation Restrictions Interpersonal Relationship;Community Activity    Stability/Clinical Decision Making Stable/Uncomplicated    Clinical Decision Making Low    Rehab Potential Good    PT Frequency 1x / week    PT Duration 12 weeks    PT  Treatment/Interventions ADLs/Self Care Home Management;Biofeedback;Cryotherapy;Electrical Stimulation;Moist Heat;Ultrasound;Neuromuscular re-education;Therapeutic exercise;Therapeutic activities;Patient/family education;Manual techniques;Dry needling;Spinal Manipulations    PT Next Visit Plan Dry needle back; internal manual work to pelvic floor, breathing to expand the lower rib cage, review manual work with wand    Consulted and Agree with Plan of Care Patient           Patient will benefit from skilled therapeutic intervention in order to improve the following deficits and impairments:  Decreased coordination, Decreased range of motion, Increased fascial restricitons, Increased muscle spasms, Pain, Decreased strength  Visit Diagnosis: Muscle weakness (generalized) - Plan: PT plan of care cert/re-cert  Cramp and spasm - Plan: PT plan of care cert/re-cert  Abnormal posture - Plan: PT plan of care cert/re-cert     Problem List Patient Active Problem List   Diagnosis Date Noted  . MDD (major depressive disorder), recurrent severe, without psychosis (HCC) 04/19/2018  . Suicide attempt by benzodiazepine overdose (HCC) 04/19/2018    Eulis Foster, PT 03/02/20 11:17 AM   [facility] [address] [place],  [place], [address] Phone: [phone]   Fax:  [phone]  Name: SARISSA DERN MRN: [id number] Date of Birth: 06/02/2000

## 2020-03-09 ENCOUNTER — Ambulatory Visit: Payer: Medicaid Other | Admitting: Physical Therapy

## 2020-03-09 ENCOUNTER — Other Ambulatory Visit: Payer: Self-pay

## 2020-03-09 ENCOUNTER — Encounter: Payer: Self-pay | Admitting: Physical Therapy

## 2020-03-09 DIAGNOSIS — M6281 Muscle weakness (generalized): Secondary | ICD-10-CM | POA: Diagnosis not present

## 2020-03-09 DIAGNOSIS — R293 Abnormal posture: Secondary | ICD-10-CM

## 2020-03-09 DIAGNOSIS — R252 Cramp and spasm: Secondary | ICD-10-CM

## 2020-03-09 NOTE — Therapy (Signed)
[facility] [address] [place], [place], [address] Phone: [phone]   Fax:  [phone]  Physical Therapy Treatment  Patient Details  Name: Alexis [name] MRN: [id number] Date of Birth: [birth year] Referring Provider (PT): Burman Foster, NP   Encounter Date: 03/09/2020   PT End of Session - 03/09/20 1100    Visit Number 2    Date for PT Re-Evaluation 05/25/20    Authorization Type Medicaid    Authorization Time Period 03/09/20-04/08/20    Authorization - Visit Number 1    Authorization - Number of Visits 3    PT Start Time 1015    PT Stop Time 1055    PT Time Calculation (min) 40 min    Activity Tolerance Patient tolerated treatment well;No increased pain    Behavior During Therapy WFL for tasks assessed/performed           Past Medical History:  Diagnosis Date  . Anxiety   . Asthma   . Depression   . GERD (gastroesophageal reflux disease)   . Migraines     Past Surgical History:  Procedure Laterality Date  . CYSTO WITH HYDRODISTENSION N/A 04/10/2018   Procedure: CYSTOSCOPY/HYDRODISTENSION;  Surgeon: Crist Fat, MD;  Location: [facility];  Service: Urology;  Laterality: N/A;  . UPPER GI ENDOSCOPY      There were no vitals filed for this visit.   Subjective Assessment - 03/09/20 1020    Subjective No changes.    Patient Stated Goals work on back pain and eliminate pain with sex, reduce burning    Currently in Pain? Yes    Pain Score 3     Pain Location Vagina    Pain Orientation Mid    Pain Descriptors / Indicators Burning;Sharp    Pain Type Acute pain    Pain Onset More than a month ago    Pain Frequency Intermittent    Aggravating Factors  urination 3/10, intercourse 5/10, eat something that not suppose to; stress    Pain Relieving Factors not urinating    Multiple Pain Sites Yes    Pain Score 6    Pain Location Back    Pain Orientation Mid;Upper    Pain  Descriptors / Indicators Dull    Pain Type Acute pain    Pain Onset More than a month ago    Pain Frequency Constant    Aggravating Factors  not exercising, lifting things at [employer], sitting long period of time, not having correct posture    Pain Relieving Factors stretches , heat, bath, correct posture                             [facility] Adult PT Treatment/Exercise - 03/09/20 0001      Lumbar Exercises: Aerobic   Stationary Bike level 3 for 5 minutes while assessing patient      Lumbar Exercises: Supine   Other Supine Lumbar Exercises lay on foam roll holding onto a cane to stretch pecs and upper back, y motion of shoulders with red band, horisontal abduction with red band,       Shoulder Exercises: Standing   Extension Strengthening;Both;12 reps;Theraband    Theraband Level (Shoulder Extension) Level 2 (Red)   1/2 kneel   Extension Limitations TActile cues to not over arch lumbar and engage abdominals      Manual Therapy   Manual Therapy Myofascial release  Myofascial Release release to the urogenital fascial going through the  3 layer to ease the pain and burning                   PT Education - 03/09/20 1059    Education Details Access Code: [email or web address]    Person(s) Educated Patient    Methods Explanation;Demonstration;Verbal cues;Handout    Comprehension Returned demonstration;Verbalized understanding            PT Short Term Goals - 03/02/20 1111      PT SHORT TERM GOAL #1   Title independent with initial HEP    Time 4    Period Weeks    Status New    Target Date 03/30/20             PT Long Term Goals - 03/02/20 1111      PT LONG TERM GOAL #1   Title independent with HEP    Baseline not educated yet    Time 12    Period Weeks    Status New    Target Date 05/25/20      PT LONG TERM GOAL #2   Title improved posture to reduce the thoracic kyphosis so she has reduction in back pain to </= 1/10    Baseline pain level 5/10     Time 12    Period Weeks    Status New    Target Date 05/25/20      PT LONG TERM GOAL #3   Title burning with urination decreased to </= 1/10 due to improved vaginal health and elongation of tissue    Baseline pain level is 5/10    Time 12    Period Weeks    Status New    Target Date 05/25/20      PT LONG TERM GOAL #4   Title pain with penile penetraction decreased </= 1/10 due to improved elongation of the pelvic floor muscles    Baseline pain level is 5/10    Time 12    Period Weeks    Status New    Target Date 05/25/20                 Plan - 03/09/20 1033    Clinical Impression Statement Patient had reduction of pain in upper back after therapy. Patient is using the wand at home to work on trigger points in the pelvic floor. Patient stands with increased thoracic kyphosis and needs to increase her strength in the back. Patient will benefit from skilled therapy to reduce her pain and improve her strength to reduce pelvic pain and back pain.    Personal Factors and Comorbidities Sex;Comorbidity 1    Comorbidities Interstitial Cystitis    Examination-Activity Limitations Toileting;Sit    Examination-Participation Restrictions Interpersonal Relationship;Community Activity    Stability/Clinical Decision Making Stable/Uncomplicated    Rehab Potential Good    PT Frequency 1x / week    PT Duration 12 weeks    PT Treatment/Interventions ADLs/Self Care Home Management;Biofeedback;Cryotherapy;Electrical Stimulation;Moist Heat;Ultrasound;Neuromuscular re-education;Therapeutic exercise;Therapeutic activities;Patient/family education;Manual techniques;Dry needling;Spinal Manipulations    PT Next Visit Plan Dry needle back; internal manual work to pelvic floor, breathing to expand the lower rib cage, internal manual work    PT Home Exercise Plan Access Code: [email or web address]    Recommended Other Services MD has signed initial note    Consulted and Agree with Plan of Care Patient            Patient will benefit from skilled  therapeutic intervention in order to improve the following deficits and impairments:  Decreased coordination, Decreased range of motion, Increased fascial restricitons, Increased muscle spasms, Pain, Decreased strength  Visit Diagnosis: Muscle weakness (generalized)  Cramp and spasm  Abnormal posture     Problem List Patient Active Problem List   Diagnosis Date Noted  . MDD (major depressive disorder), recurrent severe, without psychosis (HCC) 04/19/2018  . Suicide attempt by benzodiazepine overdose (HCC) 04/19/2018    Eulis Foster, PT 03/09/20 11:57 AM    [facility] [address] [place], [place], [address] Phone: [phone]   Fax:  [phone]  Name: SAINA WAAGE MRN: [id number] Date of Birth: 2001/04/25

## 2020-03-09 NOTE — Patient Instructions (Signed)
Access Code: [email or web address] URL: [email or web address] Date: 03/09/2020 Prepared by: Eulis Foster  Exercises Supine Shoulder Horizontal Abduction with Resistance - 1 x daily - 7 x weekly - 1 sets - 10 reps Supine March - 1 x daily - 7 x weekly - 1 sets - 10 reps Half-Kneeling Shoulder Extension with Resistance - 1 x daily - 7 x weekly - 1 sets - 10 reps [facility] [address] [place], [place] [address] Phone # [phone] Fax [phone]

## 2020-03-14 ENCOUNTER — Ambulatory Visit
Admission: RE | Admit: 2020-03-14 | Discharge: 2020-03-14 | Disposition: A | Discharge status: Home / Self Care | Payer: Medicaid Other | Source: Ambulatory Visit | Attending: Emergency Medicine | Admitting: Emergency Medicine

## 2020-03-14 ENCOUNTER — Other Ambulatory Visit: Payer: Self-pay

## 2020-03-14 VITALS — BP 120/79 | HR 107 | Temp 99.3°F | Resp 20

## 2020-03-14 DIAGNOSIS — N898 Other specified noninflammatory disorders of vagina: Secondary | ICD-10-CM | POA: Insufficient documentation

## 2020-03-14 LAB — POCT URINALYSIS DIP (MANUAL ENTRY)
Bilirubin, UA: NEGATIVE
Glucose, UA: NEGATIVE mg/dL
Leukocytes, UA: NEGATIVE
Nitrite, UA: NEGATIVE
Protein Ur, POC: NEGATIVE mg/dL
Spec Grav, UA: >=1.03 — AB (ref 1.010–1.025)
Urobilinogen, UA: 0.2 E.U./dL
pH, UA: 6 (ref 5.0–8.0)

## 2020-03-14 LAB — POCT URINE PREGNANCY: Preg Test, Ur: NEGATIVE

## 2020-03-14 MED ORDER — FLUCONAZOLE 150 MG PO TABS: 150.0000 mg | ORAL_TABLET | Freq: Every day | ORAL | 0 refills | Status: DC

## 2020-03-14 NOTE — ED Triage Notes (Signed)
Pt sts vaginal irritation x 4 days; would like STD testing and testing for BV and yeast

## 2020-03-14 NOTE — ED Provider Notes (Signed)
[name]    CSN: [id number] Arrival date & time: 03/14/20  1538      History   Chief Complaint Chief Complaint  Patient presents with  . Appointment    1600  . Vaginal Discharge    HPI Alexis [name] is a 19 y.o. female  Resenting for 4-day course of vaginal irritation and discharge.  Patient resting history of BV and yeast.  States he feels as a yeast infection as she is endorsing pruritus.  Denying pelvic or abdominal pain, back pain, fever.  No urinary symptoms such as frequency, urgency, hematuria.  Has not tried thing for this.  Also requesting STI testing as she is currently sexual active.  No known exposure.  Past Medical History:  Diagnosis Date  . Anxiety   . Asthma   . Depression   . GERD (gastroesophageal reflux disease)   . Migraines     Patient Active Problem List   Diagnosis Date Noted  . MDD (major depressive disorder), recurrent severe, without psychosis (HCC) 04/19/2018  . Suicide attempt by benzodiazepine overdose (HCC) 04/19/2018    Past Surgical History:  Procedure Laterality Date  . CYSTO WITH HYDRODISTENSION N/A 04/10/2018   Procedure: CYSTOSCOPY/HYDRODISTENSION;  Surgeon: Crist Fat, MD;  Location: [facility];  Service: Urology;  Laterality: N/A;  . UPPER GI ENDOSCOPY      OB History   No obstetric history on file.      Home Medications    Prior to Admission medications   Medication Sig Start Date End Date Taking? Authorizing Provider  albuterol (PROVENTIL HFA;VENTOLIN HFA) 108 (90 Base) MCG/ACT inhaler Inhale 1-2 puffs into the lungs every 6 (six) hours as needed for wheezing or shortness of breath. 04/22/18   Armandina Stammer I, NP  diazepam (VALIUM) 10 MG tablet Take 1 tablet (10 mg total) by mouth at bedtime. For severe anxiety 04/22/18   Armandina Stammer I, NP  escitalopram (LEXAPRO) 10 MG tablet Take 10 mg by mouth daily. Patient not taking: Reported on 03/02/2020    [provider]    famotidine (PEPCID) 20 MG tablet Take 1 tablet (20 mg total) by mouth daily. For acid reflux Patient not taking: Reported on 07/22/2019 04/23/18   Armandina Stammer I, NP  fluconazole (DIFLUCAN) 150 MG tablet Take 1 tablet (150 mg total) by mouth daily. May repeat in 72 hours if needed 03/14/20   Hall-Potvin, [place], PA-C  fluticasone (FLOVENT HFA) 110 MCG/ACT inhaler Inhale into the lungs. 05/03/19   [provider]  hydrOXYzine (ATARAX/VISTARIL) 25 MG tablet Take 1 tablet (25 mg total) by mouth every 8 (eight) hours as needed for anxiety. 04/22/18   Armandina Stammer I, NP  lamoTRIgine (LAMICTAL) 25 MG tablet Take 100 mg by mouth at bedtime. 11/05/19   [provider]  meloxicam (MOBIC) 15 MG tablet Take 1 tablet (15 mg total) by mouth daily. For pain Patient not taking: Reported on 03/02/2020 04/22/18   Armandina Stammer I, NP  mirabegron ER (MYRBETRIQ) 25 MG TB24 tablet Take 25 mg by mouth daily. Patient not taking: Reported on 03/02/2020    [provider]  Norgestimate-Ethinyl Estradiol Triphasic (TRI-LO-MARZIA) 0.18/0.215/0.25 MG-25 MCG tab Take 1 tablet by mouth at bedtime. Birth control method 04/22/18   Armandina Stammer I, NP  phenazopyridine (PYRIDIUM) 100 MG tablet Take 1 tablet (100 mg total) by mouth 3 (three) times daily. For bladder spasms 04/22/18   Armandina Stammer I, NP  phenazopyridine (PYRIDIUM) 200 MG tablet Take 1  tablet (200 mg total) by mouth daily as needed (urinary symptoms). 04/22/18   Armandina Stammer I, NP  sertraline (ZOLOFT) 25 MG tablet Take 1 tablet (25 mg total) by mouth daily. For depression Patient not taking: Reported on 07/22/2019 04/23/18   Armandina Stammer I, NP  traMADol (ULTRAM) 50 MG tablet Take 1 tablet (50 mg total) by mouth every 8 (eight) hours as needed for moderate pain. Patient not taking: Reported on 03/02/2020 04/22/18   Sanjuana Kava, NP    Family History Family History  Problem Relation Age of Onset  . Asthma Mother   . Thyroid disease Mother    . Healthy Sister     Social History Social History   Tobacco Use  . Smoking status: Never Smoker  . Smokeless tobacco: Never Used  Vaping Use  . Vaping Use: Never used  Substance Use Topics  . Alcohol use: Never  . Drug use: Never     Allergies   Other   Review of Systems As per HPI   Physical Exam Triage Vital Signs ED Triage Vitals  Enc Vitals Group     BP      Pulse      Resp      Temp      Temp src      SpO2      Weight      Height      Head Circumference      Peak Flow      Pain Score      Pain Loc      Pain Edu?      Excl. in GC?    No data found.  Updated Vital Signs BP 120/79 (BP Location: Left Arm)   Pulse (!) 107   Temp 99.3 F (37.4 C)   Resp 20   SpO2 98%   Visual Acuity Right Eye Distance:   Left Eye Distance:   Bilateral Distance:    Right Eye Near:   Left Eye Near:    Bilateral Near:     Physical Exam Constitutional:      General: She is not in acute distress. HENT:     Head: Normocephalic and atraumatic.  Eyes:     General: No scleral icterus.    Pupils: Pupils are equal, round, and reactive to light.  Cardiovascular:     Rate and Rhythm: Normal rate.  Pulmonary:     Effort: Pulmonary effort is normal.  Abdominal:     General: Bowel sounds are normal.     Palpations: Abdomen is soft.     Tenderness: There is no abdominal tenderness. There is no right CVA tenderness, left CVA tenderness or guarding.  Genitourinary:    Comments: Patient declined, self-swab performed Skin:    Coloration: Skin is not jaundiced or pale.  Neurological:     Mental Status: She is alert and oriented to person, place, and time.      UC Treatments / Results  Labs (all labs ordered are listed, but only abnormal results are displayed) Labs Reviewed  POCT URINALYSIS DIP (MANUAL ENTRY) - Abnormal; Notable for the following components:      Result Value   Ketones, POC UA trace (5) (*)    Spec Grav, UA >=1.030 (*)    Blood, UA small (*)     All other components within normal limits  POCT URINE PREGNANCY  CERVICOVAGINAL ANCILLARY ONLY    EKG   Radiology No results found.  Procedures Procedures (including critical  care time)  Medications Ordered in UC Medications - No data to display  Initial Impression / Assessment and Plan / UC Course  I have reviewed the triage vital signs and the nursing notes.  Pertinent labs & imaging results that were available during my care of the patient were reviewed by me and considered in my medical decision making (see chart for details).     Diagnostics as above.  Will treat empirically for yeast vaginitis.  Cytology pending: We will treat for STI if needed.  Return precautions discussed, pt verbalized understanding and is agreeable to plan. Final Clinical Impressions(s) / UC Diagnoses   Final diagnoses:  Vaginal discharge     Discharge Instructions     Today you received treatment for yeast. Testing for chlamydia, gonorrhea, trichomonas is pending: please look for these results on the MyChart app/website.  We will notify you if you are positive and outline treatment at that time.  Important to avoid all forms of sexual intercourse (oral, vaginal, anal) with any/all partners for the next 7 days to avoid spreading/reinfecting. Any/all sexual partners should be notified of testing/treatment today.  Return for persistent/worsening symptoms or if you develop fever, abdominal or pelvic pain, discharge, genital pain, blood in your urine, or are re-exposed to an STI.    ED Prescriptions    Medication Sig Dispense Auth. Provider   fluconazole (DIFLUCAN) 150 MG tablet Take 1 tablet (150 mg total) by mouth daily. May repeat in 72 hours if needed 2 tablet Hall-Potvin, [place], PA-C     PDMP not reviewed this encounter.   Hall-Potvin, [place], [place] 03/14/20 1758

## 2020-03-14 NOTE — Discharge Instructions (Addendum)
Today you received treatment for yeast. Testing for chlamydia, gonorrhea, trichomonas is pending: please look for these results on the MyChart app/website.  We will notify you if you are positive and outline treatment at that time.  Important to avoid all forms of sexual intercourse (oral, vaginal, anal) with any/all partners for the next 7 days to avoid spreading/reinfecting. Any/all sexual partners should be notified of testing/treatment today.  Return for persistent/worsening symptoms or if you develop fever, abdominal or pelvic pain, discharge, genital pain, blood in your urine, or are re-exposed to an STI. 

## 2020-03-16 ENCOUNTER — Other Ambulatory Visit: Payer: Self-pay

## 2020-03-16 ENCOUNTER — Ambulatory Visit: Payer: Medicaid Other | Admitting: Physical Therapy

## 2020-03-16 ENCOUNTER — Encounter: Payer: Self-pay | Admitting: Physical Therapy

## 2020-03-16 DIAGNOSIS — M6281 Muscle weakness (generalized): Secondary | ICD-10-CM | POA: Diagnosis not present

## 2020-03-16 DIAGNOSIS — R293 Abnormal posture: Secondary | ICD-10-CM

## 2020-03-16 DIAGNOSIS — R252 Cramp and spasm: Secondary | ICD-10-CM

## 2020-03-16 LAB — CERVICOVAGINAL ANCILLARY ONLY
Bacterial Vaginitis (gardnerella): NEGATIVE
Candida Glabrata: NEGATIVE
Candida Vaginitis: POSITIVE — AB
Chlamydia: NEGATIVE
Comment: NEGATIVE
Comment: NEGATIVE
Comment: NEGATIVE
Comment: NEGATIVE
Comment: NEGATIVE
Comment: NORMAL
Neisseria Gonorrhea: NEGATIVE
Trichomonas: NEGATIVE

## 2020-03-16 NOTE — Therapy (Signed)
[facility] [address] [place], [place], [address] Phone: [phone]   Fax:  [phone]  Physical Therapy Treatment  Patient Details  Name: Alexis [name] MRN: [id number] Date of Birth: 11/06/2000 Referring Provider (PT): Jed Limerick, NP   Encounter Date: 03/16/2020   PT End of Session - 03/16/20 1058    Visit Number 3    Date for PT Re-Evaluation 05/25/20    Authorization Type Medicaid    Authorization Time Period 03/09/20-04/08/20    Authorization - Visit Number 3    Authorization - Number of Visits 3    PT Start Time 5462    PT Stop Time 1055    PT Time Calculation (min) 40 min    Activity Tolerance Patient tolerated treatment well;No increased pain    Behavior During Therapy WFL for tasks assessed/performed           Past Medical History:  Diagnosis Date   Anxiety    Asthma    Depression    GERD (gastroesophageal reflux disease)    Migraines     Past Surgical History:  Procedure Laterality Date   CYSTO WITH HYDRODISTENSION N/A 04/10/2018   Procedure: CYSTOSCOPY/HYDRODISTENSION;  Surgeon: Ardis Hughs, MD;  Location: [facility];  Service: Urology;  Laterality: N/A;   UPPER GI ENDOSCOPY      There were no vitals filed for this visit.   Subjective Assessment - 03/16/20 1019    Subjective I went to the gym and my legs are still sore.    Patient Stated Goals work on back pain and eliminate pain with sex, reduce burning    Currently in Pain? Yes    Pain Score 3     Pain Location Vagina    Pain Orientation Mid    Pain Descriptors / Indicators Burning;Sharp    Pain Type Acute pain    Pain Onset More than a month ago    Pain Frequency Intermittent    Aggravating Factors  urination 3/10, intercourse 5/10, eat something that not suppose to, stress    Pain Relieving Factors not urinating    Multiple Pain Sites Yes    Pain Score 6    Pain Location Back     Pain Orientation Mid;Upper    Pain Descriptors / Indicators Dull    Pain Type Acute pain    Pain Onset More than a month ago    Pain Frequency Constant    Aggravating Factors  not exercising, lifting things at [employer], sitting long perioed of time, not having correct posture    Pain Relieving Factors stretches, heat, bath, correct posture                          Pelvic Floor Special Questions - 03/16/20 0001    Pelvic Floor Internal Exam Patient confirms identification and approves PT to assess pelvic floor and treatment    Exam Type Vaginal    Palpation tightness in the perineal body             [facility] Adult PT Treatment/Exercise - 03/16/20 0001      Lumbar Exercises: Stretches   Hip Flexor Stretch Right;Left;1 rep;60 seconds    Hip Flexor Stretch Limitations prone on foam roll    ITB Stretch Right;Left;1 rep;30 seconds    ITB Stretch Limitations foam roll on side    Piriformis Stretch Right;Left;1 rep;30 seconds    Piriformis Stretch Limitations foam  roll      Lumbar Exercises: Aerobic   Stationary Bike level 3 for 5 minutes while assessing patient      Lumbar Exercises: Prone   Other Prone Lumbar Exercises prone breathing into the back of her rib cage to move the diaphragm with tactile cues      Lumbar Exercises: Quadruped   Other Quadruped Lumbar Exercises breathing into the rib cage with expansion to open up the diaphgram with tactile and verbal cues to no move the chest forward      Manual Therapy   Manual Therapy Soft tissue mobilization;Internal Pelvic Floor    Manual therapy comments to assess for dry needling    Soft tissue mobilization using the addaday to the thoracic paraspinals to elongate the muscles after dry needling    Internal Pelvic Floor gentle manual work to the pernineal body while monitoring for pain and along the bulbocavernsosus            Trigger Point Dry Needling - 03/16/20 0001    Consent Given? Yes    Education Handout  Provided Yes    Muscles Treated Back/Hip Thoracic multifidi   T1-T5 bil.    Thoracic multifidi response Twitch response elicited;Palpable increased muscle length                PT Education - 03/16/20 1058    Education Details dry needling    Person(s) Educated Patient    Methods Explanation;Handout    Comprehension Verbalized understanding            PT Short Term Goals - 03/02/20 1111      PT SHORT TERM GOAL #1   Title independent with initial HEP    Time 4    Period Weeks    Status New    Target Date 03/30/20             PT Long Term Goals - 03/02/20 1111      PT LONG TERM GOAL #1   Title independent with HEP    Baseline not educated yet    Time 12    Period Weeks    Status New    Target Date 05/25/20      PT LONG TERM GOAL #2   Title improved posture to reduce the thoracic kyphosis so she has reduction in back pain to </= 1/10    Baseline pain level 5/10    Time 12    Period Weeks    Status New    Target Date 05/25/20      PT LONG TERM GOAL #3   Title burning with urination decreased to </= 1/10 due to improved vaginal health and elongation of tissue    Baseline pain level is 5/10    Time 12    Period Weeks    Status New    Target Date 05/25/20      PT LONG TERM GOAL #4   Title pain with penile penetraction decreased </= 1/10 due to improved elongation of the pelvic floor muscles    Baseline pain level is 5/10    Time 12    Period Weeks    Status New    Target Date 05/25/20                 Plan - 03/16/20 1059    Clinical Impression Statement Patient had a yeast infection several days ago. Patient was able to tolerate some manual work to the pelvic floor. She had tightness in the  pernineal body. Patient was sore from working on the legs with the foam roller. Patient contiues to have increased thoracic kyphosis. Patient was able to expand her lower rib cage by the end of the treatment. No goals met but is working toward them. Patient  will benefit from skilled therapy to reduce her pain and improve her strength to reduce pelvic pain and back pain.    Personal Factors and Comorbidities Sex;Comorbidity 1    Comorbidities Interstitial Cystitis    Examination-Activity Limitations Toileting;Sit    Examination-Participation Restrictions Interpersonal Relationship;Community Activity    Rehab Potential Good    PT Frequency 1x / week    PT Duration 12 weeks    PT Treatment/Interventions ADLs/Self Care Home Management;Biofeedback;Cryotherapy;Electrical Stimulation;Moist Heat;Ultrasound;Neuromuscular re-education;Therapeutic exercise;Therapeutic activities;Patient/family education;Manual techniques;Dry needling;Spinal Manipulations    PT Next Visit Plan put in medicaid continuation, internal work, expanding the rib cage    PT Home Exercise Plan Access Code: [email or web address]    Consulted and Agree with Plan of Care Patient           Patient will benefit from skilled therapeutic intervention in order to improve the following deficits and impairments:  Decreased coordination, Decreased range of motion, Increased fascial restricitons, Increased muscle spasms, Pain, Decreased strength  Visit Diagnosis: Muscle weakness (generalized)  Cramp and spasm  Abnormal posture     Problem List Patient Active Problem List   Diagnosis Date Noted   MDD (major depressive disorder), recurrent severe, without psychosis ([place]) 04/19/2018   Suicide attempt by benzodiazepine overdose ([place]) 04/19/2018    Earlie Counts, PT 03/16/20 11:02 AM   [facility] [address] [place], [place], [address] Phone: [phone]   Fax:  [phone]  Name: Alexis [name] MRN: [id number] Date of Birth: 2000-10-13

## 2020-03-16 NOTE — Patient Instructions (Addendum)

## 2020-03-23 ENCOUNTER — Encounter: Payer: Self-pay | Admitting: Physical Therapy

## 2020-03-23 ENCOUNTER — Ambulatory Visit: Payer: Medicaid Other | Admitting: Physical Therapy

## 2020-03-23 ENCOUNTER — Other Ambulatory Visit: Payer: Self-pay

## 2020-03-23 DIAGNOSIS — M6281 Muscle weakness (generalized): Secondary | ICD-10-CM | POA: Diagnosis not present

## 2020-03-23 DIAGNOSIS — R293 Abnormal posture: Secondary | ICD-10-CM

## 2020-03-23 DIAGNOSIS — R252 Cramp and spasm: Secondary | ICD-10-CM

## 2020-03-23 NOTE — Therapy (Signed)
[facility] [address] [place], [place], [address] Phone: [phone]   Fax:  [phone]  Physical Therapy Treatment  Patient Details  Name: Alexis [name] MRN: [id number] Date of Birth: 2000-11-12 Referring Provider (PT): Burman Foster, NP   Encounter Date: 03/23/2020   PT End of Session - 03/23/20 1054    Visit Number 4    Date for PT Re-Evaluation 05/25/20    Authorization Type Medicaid    Authorization Time Period 03/09/20-04/08/20    Authorization - Visit Number 3    Authorization - Number of Visits 3    PT Start Time 1015    PT Stop Time 1053    PT Time Calculation (min) 38 min    Activity Tolerance Patient tolerated treatment well;No increased pain    Behavior During Therapy WFL for tasks assessed/performed           Past Medical History:  Diagnosis Date  . Anxiety   . Asthma   . Depression   . GERD (gastroesophageal reflux disease)   . Migraines     Past Surgical History:  Procedure Laterality Date  . CYSTO WITH HYDRODISTENSION N/A 04/10/2018   Procedure: CYSTOSCOPY/HYDRODISTENSION;  Surgeon: Crist Fat, MD;  Location[facility];  Service: Urology;  Laterality: N/A;  . UPPER GI ENDOSCOPY      There were no vitals filed for this visit.   Subjective Assessment - 03/23/20 1024    Subjective reduction of pain with intercourse and urination. I have back pain.    Patient Stated Goals work on back pain and eliminate pain with sex, reduce burning    Currently in Pain? Yes    Pain Score 3     Pain Location Vagina    Pain Orientation Mid    Pain Descriptors / Indicators Burning;Sharp    Pain Type Acute pain    Pain Onset More than a month ago    Pain Frequency Intermittent    Aggravating Factors  urination 3/10, intercourse 3/10    Pain Relieving Factors not urinating    Multiple Pain Sites Yes    Pain Score 5    Pain Location Back    Pain Orientation  Mid;Upper    Pain Descriptors / Indicators Dull    Pain Type Acute pain    Pain Onset More than a month ago    Pain Frequency Constant    Aggravating Factors  not exercising, lifting things at [employer], sitting long period of time, not having correct posture    Pain Relieving Factors stretches, heat, bath, correct posture              [facility] PT Assessment - 03/23/20 0001      Assessment   Medical Diagnosis chronic cystitis(w/o) N30.20; Dysuria R30.0; Pelvic/Perineal pain R10.2; Urinary Frequency R35.0    Referring Provider (PT) Wallace Cullens Ronald Pippins, NP    Onset Date/Surgical Date --   12/26/2019   Prior Therapy yes      Precautions   Precautions None      Restrictions   Weight Bearing Restrictions No      Home Environment   Living Environment Private residence      Prior Function   Level of Independence Independent    Vocation Student    Leisure Modeling but taking a break for school      Cognition   Overall Cognitive Status Within Functional Limits for tasks assessed      Posture/Postural  Control   Posture/Postural Control Postural limitations    Postural Limitations Forward head;Rounded Shoulders;Increased thoracic kyphosis      ROM / Strength   AROM / PROM / Strength AROM;PROM;Strength      AROM   Overall AROM Comments Lumbar ROM is full      Strength   Overall Strength Comments middle and lower  trap bil. 3/5    Right Hip ABduction 4+/5    Right Hip ADduction 5/5    Left Hip External Rotation 5/5    Left Hip ABduction 4+/5    Left Hip ADduction 5/5                      Pelvic Floor Special Questions - 03/23/20 0001    Currently Sexually Active Yes    Is this Painful Yes   deep penetration   Marinoff Scale discomfort that does not affect completion    Exam Type Deferred   on cycle            [facility] Adult PT Treatment/Exercise - 03/23/20 0001      Therapeutic Activites    Therapeutic Activities Other Therapeutic Activities    Other  Therapeutic Activities educated patient on doing diaphragmatic breathing prior to urination to relax the pelvic floor, went over correct toileting with leaing on the thighs and breath work      Lumbar Exercises: Stretches   Other Lumbar Stretch Exercise sitting on foam roll to massage the pelvic floorr     Other Lumbar Stretch Exercise Latissimus dorsi stretch with emphasis to thoracic extension      Lumbar Exercises: Aerobic   Stationary Bike level 3 for 5 minutes while assessing patient      Lumbar Exercises: Supine   Other Supine Lumbar Exercises lay with foam roll perpendicular to thoracic spine and extend over it atdifferent levels    Other Supine Lumbar Exercises diaphragmatic breathing to expand the rib cage, abdomen then the pelvic floor but very difficult for patient      Lumbar Exercises: Prone   Single Arm Raise Right;Left;15 reps    Single Arm Raise Weights (lbs) therapist assisted at end range and assistance to extend the thoracic spine      Manual Therapy   Manual Therapy Soft tissue mobilization;Joint mobilization    Manual therapy comments to assess for dry needling    Joint Mobilization PA and rotational mobilization to T1-T8 to extend the thoracic spine    Soft tissue mobilization to elongate the thoracic paraspinal after dry needling            Trigger Point Dry Needling - 03/23/20 0001    Consent Given? Yes    Education Handout Provided Previously provided    Muscles Treated Back/Hip Thoracic multifidi   T1-T5 bil.    Thoracic multifidi response Twitch response elicited;Palpable increased muscle length                  PT Short Term Goals - 03/23/20 1018      PT SHORT TERM GOAL #1   Title independent with initial HEP    Time 4    Period Weeks    Status Achieved             PT Long Term Goals - 03/23/20 1019      PT LONG TERM GOAL #1   Title independent with HEP    Baseline still learnging as she progresses in therapy    Time 12  Period Weeks    Status On-going      PT LONG TERM GOAL #2   Title improved posture to reduce the thoracic kyphosis so she has reduction in back pain to </= 1/10    Baseline pain level 5/10; working on it    Time 12    Period Weeks    Status On-going      PT LONG TERM GOAL #3   Title burning with urination decreased to </= 1/10 due to improved vaginal health and elongation of tissue    Baseline pain level is 3/10    Time 12    Period Weeks    Status On-going      PT LONG TERM GOAL #4   Title pain with penile penetraction decreased </= 1/10 due to improved elongation of the pelvic floor muscles    Baseline pain level is 3/10    Time 12    Period Weeks    Status On-going                 Plan - 03/23/20 1056    Clinical Impression Statement Patient pain with penile penetration and burning with urination has decreased to 3/10 compared to 5/10. Patient continues with back pain due to her poor posture and decreased in spinal strength. Patient  has difficulty with expanding her lower rib cage and abdomen to elongate her abdomen. She has increased strength of her hips. Patient has been to the gym 1 time last week and was sore from it. Patient continues to have tightness in the pelvic floor. Patient will benefit from skilled therapy to reduce her pain and improve her strength to reduce pelvic pain and back pain.    Personal Factors and Comorbidities Sex;Comorbidity 1    Comorbidities Interstitial Cystitis    Examination-Activity Limitations Toileting;Sit    Examination-Participation Restrictions Interpersonal Relationship;Community Activity    Stability/Clinical Decision Making Stable/Uncomplicated    Rehab Potential Good    PT Frequency 1x / week    PT Treatment/Interventions ADLs/Self Care Home Management;Biofeedback;Cryotherapy;Electrical Stimulation;Moist Heat;Ultrasound;Neuromuscular re-education;Therapeutic exercise;Therapeutic activities;Patient/family education;Manual  techniques;Dry needling;Spinal Manipulations    PT Next Visit Plan internal work, expanding the rib cage    PT Home Exercise Plan Access Code: [email or web address]    Consulted and Agree with Plan of Care Patient           Patient will benefit from skilled therapeutic intervention in order to improve the following deficits and impairments:  Decreased coordination, Decreased range of motion, Increased fascial restricitons, Increased muscle spasms, Pain, Decreased strength  Visit Diagnosis: Muscle weakness (generalized)  Cramp and spasm  Abnormal posture     Problem List Patient Active Problem List   Diagnosis Date Noted  . MDD (major depressive disorder), recurrent severe, without psychosis ([facility]) 04/19/2018  . Suicide attempt by benzodiazepine overdose ([facility]) 04/19/2018    Eulis Foster, PT 03/23/20 10:59 AM   [facility] [address] [place], [place], [address] Phone: [phone]   Fax:  [phone]  Name: Alexis [name] MRN: [id number] Date of Birth: 2001-05-26

## 2020-03-30 ENCOUNTER — Encounter: Payer: Medicaid Other | Admitting: Physical Therapy

## 2020-04-09 ENCOUNTER — Emergency Department (HOSPITAL_COMMUNITY): Payer: Medicaid Other

## 2020-04-09 ENCOUNTER — Inpatient Hospital Stay (HOSPITAL_COMMUNITY)
Admission: EM | Admit: 2020-04-09 | Discharge: 2020-04-12 | DRG: 917 | Disposition: A | Discharge status: Other Acute Inpatient Hospital | Payer: Medicaid Other | Attending: Pulmonary Disease | Admitting: Pulmonary Disease

## 2020-04-09 ENCOUNTER — Other Ambulatory Visit: Payer: Self-pay

## 2020-04-09 ENCOUNTER — Encounter (HOSPITAL_COMMUNITY): Payer: Self-pay | Admitting: Emergency Medicine

## 2020-04-09 DIAGNOSIS — R739 Hyperglycemia, unspecified: Secondary | ICD-10-CM | POA: Diagnosis present

## 2020-04-09 DIAGNOSIS — Z79899 Other long term (current) drug therapy: Secondary | ICD-10-CM

## 2020-04-09 DIAGNOSIS — R4189 Other symptoms and signs involving cognitive functions and awareness: Secondary | ICD-10-CM | POA: Diagnosis not present

## 2020-04-09 DIAGNOSIS — R102 Pelvic and perineal pain: Secondary | ICD-10-CM | POA: Diagnosis not present

## 2020-04-09 DIAGNOSIS — R292 Abnormal reflex: Secondary | ICD-10-CM | POA: Diagnosis present

## 2020-04-09 DIAGNOSIS — J9610 Chronic respiratory failure, unspecified whether with hypoxia or hypercapnia: Secondary | ICD-10-CM | POA: Diagnosis present

## 2020-04-09 DIAGNOSIS — F329 Major depressive disorder, single episode, unspecified: Secondary | ICD-10-CM | POA: Diagnosis present

## 2020-04-09 DIAGNOSIS — T50901A Poisoning by unspecified drugs, medicaments and biological substances, accidental (unintentional), initial encounter: Secondary | ICD-10-CM | POA: Diagnosis present

## 2020-04-09 DIAGNOSIS — Z20822 Contact with and (suspected) exposure to covid-19: Secondary | ICD-10-CM | POA: Diagnosis present

## 2020-04-09 DIAGNOSIS — Z9151 Personal history of suicidal behavior: Secondary | ICD-10-CM | POA: Diagnosis not present

## 2020-04-09 DIAGNOSIS — Z818 Family history of other mental and behavioral disorders: Secondary | ICD-10-CM

## 2020-04-09 DIAGNOSIS — T50902A Poisoning by unspecified drugs, medicaments and biological substances, intentional self-harm, initial encounter: Secondary | ICD-10-CM

## 2020-04-09 DIAGNOSIS — R069 Unspecified abnormalities of breathing: Secondary | ICD-10-CM

## 2020-04-09 DIAGNOSIS — T426X1A Poisoning by other antiepileptic and sedative-hypnotic drugs, accidental (unintentional), initial encounter: Secondary | ICD-10-CM

## 2020-04-09 DIAGNOSIS — F1010 Alcohol abuse, uncomplicated: Secondary | ICD-10-CM | POA: Diagnosis present

## 2020-04-09 DIAGNOSIS — H532 Diplopia: Secondary | ICD-10-CM | POA: Diagnosis present

## 2020-04-09 DIAGNOSIS — M549 Dorsalgia, unspecified: Secondary | ICD-10-CM | POA: Diagnosis present

## 2020-04-09 DIAGNOSIS — F419 Anxiety disorder, unspecified: Secondary | ICD-10-CM | POA: Diagnosis present

## 2020-04-09 DIAGNOSIS — T426X2A Poisoning by other antiepileptic and sedative-hypnotic drugs, intentional self-harm, initial encounter: Principal | ICD-10-CM | POA: Diagnosis present

## 2020-04-09 DIAGNOSIS — E876 Hypokalemia: Secondary | ICD-10-CM | POA: Diagnosis present

## 2020-04-09 DIAGNOSIS — F1210 Cannabis abuse, uncomplicated: Secondary | ICD-10-CM | POA: Diagnosis present

## 2020-04-09 DIAGNOSIS — G9340 Encephalopathy, unspecified: Secondary | ICD-10-CM | POA: Diagnosis not present

## 2020-04-09 DIAGNOSIS — Y901 Blood alcohol level of 20-39 mg/100 ml: Secondary | ICD-10-CM | POA: Diagnosis not present

## 2020-04-09 DIAGNOSIS — G43909 Migraine, unspecified, not intractable, without status migrainosus: Secondary | ICD-10-CM | POA: Diagnosis present

## 2020-04-09 DIAGNOSIS — Z825 Family history of asthma and other chronic lower respiratory diseases: Secondary | ICD-10-CM | POA: Diagnosis not present

## 2020-04-09 DIAGNOSIS — K219 Gastro-esophageal reflux disease without esophagitis: Secondary | ICD-10-CM | POA: Diagnosis not present

## 2020-04-09 DIAGNOSIS — Z23 Encounter for immunization: Secondary | ICD-10-CM

## 2020-04-09 DIAGNOSIS — F332 Major depressive disorder, recurrent severe without psychotic features: Secondary | ICD-10-CM | POA: Diagnosis not present

## 2020-04-09 DIAGNOSIS — J45909 Unspecified asthma, uncomplicated: Secondary | ICD-10-CM | POA: Diagnosis present

## 2020-04-09 DIAGNOSIS — G8929 Other chronic pain: Secondary | ICD-10-CM | POA: Diagnosis not present

## 2020-04-09 DIAGNOSIS — R4587 Impulsiveness: Secondary | ICD-10-CM | POA: Diagnosis present

## 2020-04-09 DIAGNOSIS — R9431 Abnormal electrocardiogram [ECG] [EKG]: Secondary | ICD-10-CM | POA: Diagnosis present

## 2020-04-09 DIAGNOSIS — Z91018 Allergy to other foods: Secondary | ICD-10-CM

## 2020-04-09 DIAGNOSIS — G928 Other toxic encephalopathy: Secondary | ICD-10-CM | POA: Diagnosis present

## 2020-04-09 LAB — CBC WITH DIFFERENTIAL/PLATELET
Abs Immature Granulocytes: 0.04 10*3/uL (ref 0.00–0.07)
Basophils Absolute: 0 10*3/uL (ref 0.0–0.1)
Basophils Relative: 0 %
Eosinophils Absolute: 0.1 10*3/uL (ref 0.0–0.5)
Eosinophils Relative: 1 %
HCT: 41.8 % (ref 36.0–46.0)
Hemoglobin: 13.5 g/dL (ref 12.0–15.0)
Immature Granulocytes: 1 %
Lymphocytes Relative: 24 %
Lymphs Abs: 1.8 10*3/uL (ref 0.7–4.0)
MCH: 30.2 pg (ref 26.0–34.0)
MCHC: 32.3 g/dL (ref 30.0–36.0)
MCV: 93.5 fL (ref 80.0–100.0)
Monocytes Absolute: 0.4 10*3/uL (ref 0.1–1.0)
Monocytes Relative: 5 %
Neutro Abs: 5.4 10*3/uL (ref 1.7–7.7)
Neutrophils Relative %: 69 %
Platelets: 221 10*3/uL (ref 150–400)
RBC: 4.47 MIL/uL (ref 3.87–5.11)
RDW: 12.3 % (ref 11.5–15.5)
WBC: 7.8 10*3/uL (ref 4.0–10.5)
nRBC: 0 % (ref 0.0–0.2)

## 2020-04-09 LAB — PROTIME-INR
INR: 1 (ref 0.8–1.2)
Prothrombin Time: 13 seconds (ref 11.4–15.2)

## 2020-04-09 LAB — COMPREHENSIVE METABOLIC PANEL
ALT: 10 U/L (ref 0–44)
ALT: 10 U/L (ref 0–44)
AST: 17 U/L (ref 15–41)
AST: 16 U/L (ref 15–41)
Albumin: 4.3 g/dL (ref 3.5–5.0)
Albumin: 3.8 g/dL (ref 3.5–5.0)
Alkaline Phosphatase: 41 U/L (ref 38–126)
Alkaline Phosphatase: 38 U/L (ref 38–126)
Anion gap: 15 (ref 5–15)
Anion gap: 12 (ref 5–15)
BUN: 10 mg/dL (ref 6–20)
BUN: 9 mg/dL (ref 6–20)
CO2: 22 mmol/L (ref 22–32)
CO2: 21 mmol/L — ABNORMAL LOW (ref 22–32)
Calcium: 8.9 mg/dL (ref 8.9–10.3)
Calcium: 8.4 mg/dL — ABNORMAL LOW (ref 8.9–10.3)
Chloride: 104 mmol/L (ref 98–111)
Chloride: 106 mmol/L (ref 98–111)
Creatinine, Ser: 0.49 mg/dL (ref 0.44–1.00)
Creatinine, Ser: 0.57 mg/dL (ref 0.44–1.00)
GFR, Estimated: >60 mL/min (ref >60)
GFR, Estimated: >60 mL/min (ref >60)
Glucose, Bld: 167 mg/dL — ABNORMAL HIGH (ref 70–99)
Glucose, Bld: 149 mg/dL — ABNORMAL HIGH (ref 70–99)
Potassium: 3.3 mmol/L — ABNORMAL LOW (ref 3.5–5.1)
Potassium: 3.3 mmol/L — ABNORMAL LOW (ref 3.5–5.1)
Sodium: 141 mmol/L (ref 135–145)
Sodium: 139 mmol/L (ref 135–145)
Total Bilirubin: 0.6 mg/dL (ref 0.3–1.2)
Total Bilirubin: 0.8 mg/dL (ref 0.3–1.2)
Total Protein: 7 g/dL (ref 6.5–8.1)
Total Protein: 6.3 g/dL — ABNORMAL LOW (ref 6.5–8.1)

## 2020-04-09 LAB — BLOOD GAS, ARTERIAL
Acid-base deficit: 0.5 mmol/L (ref 0.0–2.0)
Bicarbonate: 21.4 mmol/L (ref 20.0–28.0)
FIO2: 100
MECHVT: 470 mL
O2 Saturation: 100 %
PEEP: 5 cmH2O
Patient temperature: 97.6
pCO2 arterial: 27.4 mmHg — ABNORMAL LOW (ref 32.0–48.0)
pH, Arterial: 7.503 — ABNORMAL HIGH (ref 7.350–7.450)
pO2, Arterial: 533 mmHg — ABNORMAL HIGH (ref 83.0–108.0)

## 2020-04-09 LAB — RESPIRATORY PANEL BY RT PCR (FLU A&B, COVID)
Influenza A by PCR: NEGATIVE
Influenza B by PCR: NEGATIVE
SARS Coronavirus 2 by RT PCR: NEGATIVE

## 2020-04-09 LAB — ETHANOL: Alcohol, Ethyl (B): 35 mg/dL — ABNORMAL HIGH (ref <10)

## 2020-04-09 LAB — HCG, SERUM, QUALITATIVE: Preg, Serum: NEGATIVE

## 2020-04-09 LAB — URINALYSIS, ROUTINE W REFLEX MICROSCOPIC
Bilirubin Urine: NEGATIVE
Glucose, UA: 50 mg/dL — AB
Hgb urine dipstick: NEGATIVE
Ketones, ur: 20 mg/dL — AB
Leukocytes,Ua: NEGATIVE
Nitrite: NEGATIVE
Protein, ur: NEGATIVE mg/dL
Specific Gravity, Urine: 1.013 (ref 1.005–1.030)
pH: 5 (ref 5.0–8.0)

## 2020-04-09 LAB — RAPID URINE DRUG SCREEN, HOSP PERFORMED
Amphetamines: NOT DETECTED
Barbiturates: NOT DETECTED
Benzodiazepines: NOT DETECTED
Cocaine: NOT DETECTED
Opiates: NOT DETECTED
Tetrahydrocannabinol: POSITIVE — AB

## 2020-04-09 LAB — ACETAMINOPHEN LEVEL: Acetaminophen (Tylenol), Serum: <10 ug/mL — ABNORMAL LOW (ref 10–30)

## 2020-04-09 LAB — CK: Total CK: 45 U/L (ref 38–234)

## 2020-04-09 LAB — CBG MONITORING, ED: Glucose-Capillary: 139 mg/dL — ABNORMAL HIGH (ref 70–99)

## 2020-04-09 LAB — SALICYLATE LEVEL: Salicylate Lvl: <7 mg/dL — ABNORMAL LOW (ref 7.0–30.0)

## 2020-04-09 LAB — MAGNESIUM: Magnesium: 1.7 mg/dL (ref 1.7–2.4)

## 2020-04-09 MED ORDER — LORAZEPAM 2 MG/ML IJ SOLN
INTRAMUSCULAR | Status: AC
  Filled 2020-04-09: qty 1

## 2020-04-09 MED ORDER — DEXTROSE-NACL 5-0.45 % IV SOLN: INTRAVENOUS | Status: DC

## 2020-04-09 MED ORDER — FLUTICASONE PROPIONATE HFA 110 MCG/ACT IN AERO: 1.0000 | INHALATION_SPRAY | Freq: Two times a day (BID) | RESPIRATORY_TRACT | Status: DC

## 2020-04-09 MED ORDER — SODIUM CHLORIDE 0.9 % IV BOLUS
1000.0000 mL | Freq: Once | INTRAVENOUS | Status: AC
  Administered 2020-04-09: 1000 mL via INTRAVENOUS

## 2020-04-09 MED ORDER — LEVETIRACETAM IN NACL 1000 MG/100ML IV SOLN
1000.0000 mg | Freq: Once | INTRAVENOUS | Status: AC
  Administered 2020-04-09: 1000 mg via INTRAVENOUS
  Filled 2020-04-09: qty 100

## 2020-04-09 MED ORDER — POLYETHYLENE GLYCOL 3350 17 G PO PACK: 17.0000 g | PACK | Freq: Every day | ORAL | Status: DC | PRN

## 2020-04-09 MED ORDER — SODIUM CHLORIDE 0.9 % IV BOLUS: 1000.0000 mL | Freq: Once | INTRAVENOUS | Status: DC

## 2020-04-09 MED ORDER — MIDAZOLAM 50MG/50ML (1MG/ML) PREMIX INFUSION
1.0000 mg/h | INTRAVENOUS | Status: DC
  Administered 2020-04-09: 1 mg/h via INTRAVENOUS
  Filled 2020-04-09 (×2): qty 50

## 2020-04-09 MED ORDER — FENTANYL 2500MCG IN NS 250ML (10MCG/ML) PREMIX INFUSION
25.0000 ug/h | INTRAVENOUS | Status: DC
  Administered 2020-04-09: 25 ug/h via INTRAVENOUS
  Filled 2020-04-09: qty 250

## 2020-04-09 MED ORDER — ACETAMINOPHEN 325 MG PO TABS
650.0000 mg | ORAL_TABLET | ORAL | Status: DC | PRN
  Administered 2020-04-11 – 2020-04-12 (×3): 650 mg via ORAL
  Filled 2020-04-09 (×3): qty 2

## 2020-04-09 MED ORDER — FAMOTIDINE IN NACL 20-0.9 MG/50ML-% IV SOLN
20.0000 mg | Freq: Two times a day (BID) | INTRAVENOUS | Status: DC
  Administered 2020-04-09: 20 mg via INTRAVENOUS
  Filled 2020-04-09: qty 50

## 2020-04-09 MED ORDER — POTASSIUM CHLORIDE 10 MEQ/100ML IV SOLN
10.0000 meq | INTRAVENOUS | Status: AC
  Administered 2020-04-09 – 2020-04-10 (×2): 10 meq via INTRAVENOUS
  Filled 2020-04-09 (×2): qty 100

## 2020-04-09 MED ORDER — ALBUTEROL SULFATE (2.5 MG/3ML) 0.083% IN NEBU: 2.5000 mg | INHALATION_SOLUTION | RESPIRATORY_TRACT | Status: DC | PRN

## 2020-04-09 MED ORDER — ETOMIDATE 2 MG/ML IV SOLN
INTRAVENOUS | Status: AC
  Filled 2020-04-09: qty 20

## 2020-04-09 MED ORDER — HEPARIN SODIUM (PORCINE) 5000 UNIT/ML IJ SOLN
5000.0000 [IU] | Freq: Three times a day (TID) | INTRAMUSCULAR | Status: DC
  Administered 2020-04-09 – 2020-04-12 (×9): 5000 [IU] via SUBCUTANEOUS
  Filled 2020-04-09 (×9): qty 1

## 2020-04-09 MED ORDER — ROCURONIUM BROMIDE 10 MG/ML (PF) SYRINGE
PREFILLED_SYRINGE | INTRAVENOUS | Status: AC
  Filled 2020-04-09: qty 10

## 2020-04-09 MED ORDER — DOCUSATE SODIUM 100 MG PO CAPS: 100.0000 mg | ORAL_CAPSULE | Freq: Two times a day (BID) | ORAL | Status: DC | PRN

## 2020-04-09 NOTE — ED Triage Notes (Signed)
19 yo female BIBA s/p overdose. Pt sent goodbye text to her sister around 4:45 pm today, when sister arrived at pts apartment, pt was found unresponsive with empty bottle of gabapentin at bedside. Sister called EMS, [facility] [employer] gave pt 4mg  of narcan, no evidence to suggest opiate use. EMS states pt did smell of ETOH use. Vitals were stable upon arrival. EMS states pt has history of mental illness, bipolar and depression.  Vitals : bp 110/70 Hr 100  rr 20 spo2 98 % ra cbg 148 ekg sinus tach per EMS No lines placed

## 2020-04-09 NOTE — H&P (Signed)
NAME:  Alexis [name], MRN:  [id number], DOB:  July 09, 2000, LOS: 0 ADMISSION DATE:  04/09/2020, CONSULTATION DATE:  04/09/20 REFERRING MD:  Dr. Stevie Kern, CHIEF COMPLAINT:  encephalopathy   Brief History   19 year old woman here with acute encephalopathy and agitation after intentional overdose.   History of present illness   19 year old woman here with apparent intentional overdose with suicidal ideation expressed to family.  Sent text expressing plan for suicide to sister at 445pm.  Found unresponsive by sister with empty bottle of gabapentin.  She also found empty bottle of lamictal.  ETOH level 35 mg/dl.  Reportedly was combative but non verbal  arrival to ED, became unarousable noted at 613pm and was intubated.   Saturation 100% on room air, intubated for airway protection with etomidate and rocuronium. Fentanyl and versed infusion started.  Poison control was notified, recommended supportive care.   Sinus tachycardia.    She also has access to diazepam, escitalopram, certraline, hydroxizine, meloxicam,  and tramadol.  Past Medical History  Anxiety Asthma Depression  GERD Migraines. Interstitial cystitis Back pain, chronic Pelvic pain Vaginal yeast infections  Significant Hospital Events     Consults:  Neurology  Procedures:  Intubation 04/09/20  Significant Diagnostic Tests:  CT head Pending  Micro Data:    Antimicrobials:    Interim history/subjective:    Objective   Blood pressure (!) 104/59, pulse (!) 103, resp. rate 13, height 5\' 6"  (1.676 m), SpO2 100 %.    Vent Mode: PRVC FiO2 (%):  [100 %] 100 % Set Rate:  [20 bmp] 20 bmp Vt Set:  [430 mL-470 mL] 470 mL PEEP:  [5 cmH20] 5 cmH20 Plateau Pressure:  [11 cmH20] 11 cmH20  No intake or output data in the 24 hours ending 04/09/20 1922 There were no vitals filed for this visit.  Examination: General: intermittent myoclonus, non responsive to verbal or painful stimuli consistently, myoclonus with  stimulation intermittently, intermittent agitation HENT: pupils dilated to about 8-9 mm, symmetric and reactive to light. Lungs: CTAB Cardiovascular: intermittent sinus tach Abdomen: NT, ND, NBS Extremities: no edema, no erythema Neuro: as above, non responsive consistently to pain or verbal stimuli.  Hyperreflexic DTRs, no clonus, no response to [facility] Problem list     Assessment & Plan:  # acute encephalopathy: Acute intentional drug overdose.  Likely took large amount gabapentin and lamotrigine.  Also possibly has access to diazepam, escitalopram, certraline, hydroxizine, meloxicam,  and tramadol.  Unk ingestion overall. Consider seizure activity given myoclonic activity, difficult to differentiate seizure from medication related myoclonus.   Consider serotonin syndrome.   Utox pending.   Continue supportive care, mech vent, fentanyl and versed as needed (infusions), starting empiric Keppra per neuro recs, stat EEG.   Tx to [place] for EEG needs. Also checking CT head non con.   Monitor cardiac status on tele, has mild QTC prolongation now.  Hold home meds except Pepcid.   U preg pending.  Replace K.     #Low UOP: monitor, cont fluids. Check CK.    Best practice:  Diet: NPO for now, maybe extubatable in AM.  Pain/Anxiety/Delirium protocol (if indicated): fentanyl/versed.  VAP protocol (if indicated):  DVT prophylaxis: heparin GI prophylaxis: pepcid Glucose control: prn Mobility: bedrest Code Status: Full  Family Communication: Discussed with father and sister at bedside.  Disposition: ICU   Labs   CBC: Recent Labs  Lab 04/09/20 1816  WBC 7.8  NEUTROABS 5.4  HGB 13.5  HCT 41.8  MCV 93.5  PLT 221    Basic Metabolic Panel: Recent Labs  Lab 04/09/20 1816  NA 141  K 3.3*  CL 104  CO2 22  GLUCOSE 167*  BUN 10  CREATININE 0.49  CALCIUM 8.9   GFR: CrCl cannot be calculated (Unknown ideal weight.). Recent Labs  Lab 04/09/20 1816    WBC 7.8    Liver Function Tests: Recent Labs  Lab 04/09/20 1816  AST 17  ALT 10  ALKPHOS 41  BILITOT 0.6  PROT 7.0  ALBUMIN 4.3   No results for input(s): LIPASE, AMYLASE in the last 168 hours. No results for input(s): AMMONIA in the last 168 hours.  ABG No results found for: PHART, PCO2ART, PO2ART, HCO3, TCO2, ACIDBASEDEF, O2SAT   Coagulation Profile: No results for input(s): INR, PROTIME in the last 168 hours.  Cardiac Enzymes: No results for input(s): CKTOTAL, CKMB, CKMBINDEX, TROPONINI in the last 168 hours.  HbA1C: No results found for: HGBA1C  CBG: Recent Labs  Lab 04/09/20 1854  GLUCAP 139*    Review of Systems:   Unable toassess   Past Medical History  She,  has a past medical history of Anxiety, Asthma, Depression, GERD (gastroesophageal reflux disease), and Migraines.   Surgical History    Past Surgical History:  Procedure Laterality Date  . CYSTO WITH HYDRODISTENSION N/A 04/10/2018   Procedure: CYSTOSCOPY/HYDRODISTENSION;  Surgeon: Crist Fat, MD;  Location: [facility];  Service: Urology;  Laterality: N/A;  . UPPER GI ENDOSCOPY       Social History   reports that she has never smoked. She has never used smokeless tobacco. She reports that she does not drink alcohol and does not use drugs.   Family History   Her family history includes Asthma in her mother; Healthy in her sister; Thyroid disease in her mother.   Allergies Allergies  Allergen Reactions  . Other Other (See Comments)    All acidic foods cause extreme bladder pain     Home Medications  Prior to Admission medications   Medication Sig Start Date End Date Taking? Authorizing Provider  albuterol (PROVENTIL HFA;VENTOLIN HFA) 108 (90 Base) MCG/ACT inhaler Inhale 1-2 puffs into the lungs every 6 (six) hours as needed for wheezing or shortness of breath. 04/22/18   Armandina Stammer I, NP  diazepam (VALIUM) 10 MG tablet Take 1 tablet (10 mg total) by mouth at  bedtime. For severe anxiety 04/22/18   Armandina Stammer I, NP  escitalopram (LEXAPRO) 10 MG tablet Take 10 mg by mouth daily. Patient not taking: Reported on 03/02/2020    [provider]  famotidine (PEPCID) 20 MG tablet Take 1 tablet (20 mg total) by mouth daily. For acid reflux Patient not taking: Reported on 07/22/2019 04/23/18   Armandina Stammer I, NP  fluconazole (DIFLUCAN) 150 MG tablet Take 1 tablet (150 mg total) by mouth daily. May repeat in 72 hours if needed 03/14/20   Hall-Potvin, [place], PA-C  fluticasone (FLOVENT HFA) 110 MCG/ACT inhaler Inhale into the lungs. 05/03/19   [provider]  hydrOXYzine (ATARAX/VISTARIL) 25 MG tablet Take 1 tablet (25 mg total) by mouth every 8 (eight) hours as needed for anxiety. 04/22/18   Armandina Stammer I, NP  lamoTRIgine (LAMICTAL) 25 MG tablet Take 100 mg by mouth at bedtime. 11/05/19   [provider]  meloxicam (MOBIC) 15 MG tablet Take 1 tablet (15 mg total) by mouth daily. For pain Patient not taking: Reported on 03/02/2020 04/22/18  Armandina Stammer I, NP  mirabegron ER (MYRBETRIQ) 25 MG TB24 tablet Take 25 mg by mouth daily. Patient not taking: Reported on 03/02/2020    [provider]  Norgestimate-Ethinyl Estradiol Triphasic (TRI-LO-MARZIA) 0.18/0.215/0.25 MG-25 MCG tab Take 1 tablet by mouth at bedtime. Birth control method 04/22/18   Armandina Stammer I, NP  phenazopyridine (PYRIDIUM) 100 MG tablet Take 1 tablet (100 mg total) by mouth 3 (three) times daily. For bladder spasms 04/22/18   Armandina Stammer I, NP  phenazopyridine (PYRIDIUM) 200 MG tablet Take 1 tablet (200 mg total) by mouth daily as needed (urinary symptoms). 04/22/18   Armandina Stammer I, NP  sertraline (ZOLOFT) 25 MG tablet Take 1 tablet (25 mg total) by mouth daily. For depression Patient not taking: Reported on 07/22/2019 04/23/18   Armandina Stammer I, NP  traMADol (ULTRAM) 50 MG tablet Take 1 tablet (50 mg total) by mouth every 8 (eight) hours as needed for moderate  pain. Patient not taking: Reported on 03/02/2020 04/22/18   Armandina Stammer I, NP     Critical care time: 45 minutes.

## 2020-04-09 NOTE — ED Provider Notes (Signed)
[facility]-EMERGENCY DEPT Provider Note   CSN: [id number] Arrival date & time: 04/09/20  1739     History No chief complaint on file.   Alexis [name] is a 19 y.o. female.  History limited due to altered mental status.  Per EMS report, "Pt sent goodbye text to her sister around 4:45 pm today, when sister arrived at pts apartment, pt was found unresponsive with empty bottle of gabapentin at bedside. Sister called EMS, [employer] gave pt 4mg  of narcan, no evidence to suggest opiate use. EMS states pt did smell of ETOH use."  Obtained additional history from father and sister.  Sister confirmed prior report regarding receiving worrisome texts.  The only empty bottle that she can recall was gabapentin but does know that patient had access to other medications though she cannot recall which medicines.  HPI     Past Medical History:  Diagnosis Date  . Anxiety   . Asthma   . Depression   . GERD (gastroesophageal reflux disease)   . Migraines     Patient Active Problem List   Diagnosis Date Noted  . MDD (major depressive disorder), recurrent severe, without psychosis (HCC) 04/19/2018  . Suicide attempt by benzodiazepine overdose (HCC) 04/19/2018    Past Surgical History:  Procedure Laterality Date  . CYSTO WITH HYDRODISTENSION N/A 04/10/2018   Procedure: CYSTOSCOPY/HYDRODISTENSION;  Surgeon: 04/12/2018, MD;  Location[facility];  Service: Urology;  Laterality: N/A;  . UPPER GI ENDOSCOPY       OB History   No obstetric history on file.     Family History  Problem Relation Age of Onset  . Asthma Mother   . Thyroid disease Mother   . Healthy Sister     Social History   Tobacco Use  . Smoking status: Never Smoker  . Smokeless tobacco: Never Used  Vaping Use  . Vaping Use: Never used  Substance Use Topics  . Alcohol use: Never  . Drug use: Never    Home Medications Prior to Admission medications   Medication Sig  Start Date End Date Taking? Authorizing Provider  albuterol (PROVENTIL HFA;VENTOLIN HFA) 108 (90 Base) MCG/ACT inhaler Inhale 1-2 puffs into the lungs every 6 (six) hours as needed for wheezing or shortness of breath. 04/22/18   04/24/18 I, NP  diazepam (VALIUM) 10 MG tablet Take 1 tablet (10 mg total) by mouth at bedtime. For severe anxiety 04/22/18   04/24/18 I, NP  escitalopram (LEXAPRO) 10 MG tablet Take 10 mg by mouth daily. Patient not taking: Reported on 03/02/2020    [provider]  famotidine (PEPCID) 20 MG tablet Take 1 tablet (20 mg total) by mouth daily. For acid reflux Patient not taking: Reported on 07/22/2019 04/23/18   04/25/18 I, NP  fluconazole (DIFLUCAN) 150 MG tablet Take 1 tablet (150 mg total) by mouth daily. May repeat in 72 hours if needed 03/14/20   Hall-Potvin, 03/16/20, PA-C  fluticasone (FLOVENT HFA) 110 MCG/ACT inhaler Inhale into the lungs. 05/03/19   [provider]  hydrOXYzine (ATARAX/VISTARIL) 25 MG tablet Take 1 tablet (25 mg total) by mouth every 8 (eight) hours as needed for anxiety. 04/22/18   04/24/18 I, NP  lamoTRIgine (LAMICTAL) 25 MG tablet Take 100 mg by mouth at bedtime. 11/05/19   [provider]  meloxicam (MOBIC) 15 MG tablet Take 1 tablet (15 mg total) by mouth daily. For pain Patient not taking: Reported on 03/02/2020 04/22/18  Armandina StammerNwoko, Agnes I, NP  mirabegron ER (MYRBETRIQ) 25 MG TB24 tablet Take 25 mg by mouth daily. Patient not taking: Reported on 03/02/2020    [provider]  Norgestimate-Ethinyl Estradiol Triphasic (TRI-LO-MARZIA) 0.18/0.215/0.25 MG-25 MCG tab Take 1 tablet by mouth at bedtime. Birth control method 04/22/18   Armandina StammerNwoko, Agnes I, NP  phenazopyridine (PYRIDIUM) 100 MG tablet Take 1 tablet (100 mg total) by mouth 3 (three) times daily. For bladder spasms 04/22/18   Armandina StammerNwoko, Agnes I, NP  phenazopyridine (PYRIDIUM) 200 MG tablet Take 1 tablet (200 mg total) by mouth daily as needed (urinary  symptoms). 04/22/18   Armandina StammerNwoko, Agnes I, NP  sertraline (ZOLOFT) 25 MG tablet Take 1 tablet (25 mg total) by mouth daily. For depression Patient not taking: Reported on 07/22/2019 04/23/18   Armandina StammerNwoko, Agnes I, NP  traMADol (ULTRAM) 50 MG tablet Take 1 tablet (50 mg total) by mouth every 8 (eight) hours as needed for moderate pain. Patient not taking: Reported on 03/02/2020 04/22/18   Armandina StammerNwoko, Agnes I, NP    Allergies    Other  Review of Systems   Review of Systems  Unable to perform ROS: Mental status change    Physical Exam Updated Vital Signs BP (!) 104/59   Pulse (!) 103   Resp 13   Ht 5\' 6"  (1.676 m)   SpO2 100%   BMI 17.43 kg/m   Physical Exam Constitutional:      Comments: Unresponsive, lying in bed  HENT:     Head: Normocephalic and atraumatic.     Nose: Nose normal.     Mouth/Throat:     Mouth: Mucous membranes are moist.  Eyes:     Comments: Mydriatic pupils  Cardiovascular:     Rate and Rhythm: Tachycardia present.     Pulses: Normal pulses.  Pulmonary:     Comments: Mildly slow respirations, breath sounds clear Neurological:     Mental Status: She is unresponsive.     GCS: GCS eye subscore is 1. GCS verbal subscore is 1. GCS motor subscore is 1.      ED Results / Procedures / Treatments   Labs (all labs ordered are listed, but only abnormal results are displayed) Labs Reviewed  COMPREHENSIVE METABOLIC PANEL - Abnormal; Notable for the following components:      Result Value   Potassium 3.3 (*)    Glucose, Bld 167 (*)    All other components within normal limits  SALICYLATE LEVEL - Abnormal; Notable for the following components:   Salicylate Lvl <7.0 (*)    All other components within normal limits  ACETAMINOPHEN LEVEL - Abnormal; Notable for the following components:   Acetaminophen (Tylenol), Serum <10 (*)    All other components within normal limits  ETHANOL - Abnormal; Notable for the following components:   Alcohol, Ethyl (B) 35 (*)    All other  components within normal limits  CBG MONITORING, ED - Abnormal; Notable for the following components:   Glucose-Capillary 139 (*)    All other components within normal limits  RESPIRATORY PANEL BY RT PCR (FLU A&B, COVID)  CBC WITH DIFFERENTIAL/PLATELET  RAPID URINE DRUG SCREEN, HOSP PERFORMED  MAGNESIUM  BLOOD GAS, ARTERIAL  LAMOTRIGINE LEVEL  I-STAT BETA HCG BLOOD, ED ([facility], [facility], [facility] ONLY)    EKG EKG Interpretation  Date/Time:  Sunday April 09 2020 18:57:02 EST Ventricular Rate:  104 PR Interval:    QRS Duration: 109 QT Interval:  360 QTC Calculation: 474 R Axis:   79  Text Interpretation: Sinus tachycardia Consider left atrial enlargement RSR' in V1 or V2, probably normal variant Borderline Q waves in lateral leads Borderline T wave abnormalities Confirmed by Marianna Fuss ([id number]) on 04/09/2020 7:07:42 PM   Radiology DG Chest Portable 1 View  Result Date: 04/09/2020 CLINICAL DATA:  Drug overdose EXAM: PORTABLE CHEST 1 VIEW COMPARISON:  None. FINDINGS: The heart size and mediastinal contours are within normal limits. Both lungs are clear. The visualized skeletal structures are unremarkable. Endotracheal tube tip at level of the clavicular heads. IMPRESSION: No active disease. Electronically Signed   By: Deatra Robinson M.D.   On: 04/09/2020 19:27    Procedures .Critical Care Performed by: Milagros Loll, MD Authorized by: Milagros Loll, MD   Critical care provider statement:    Critical care time (minutes):  41   Critical care was necessary to treat or prevent imminent or life-threatening deterioration of the following conditions:  Respiratory failure, CNS failure or compromise and toxidrome   Critical care was time spent personally by me on the following activities:  Discussions with consultants, evaluation of patient's response to treatment, examination of patient, ordering and performing treatments and interventions, ordering and review of laboratory studies,  ordering and review of radiographic studies, pulse oximetry, re-evaluation of patient's condition, obtaining history from patient or surrogate and review of old charts Procedure Name: Intubation Date/Time: 04/09/2020 7:39 PM Performed by: Milagros Loll, MD Pre-anesthesia Checklist: Patient identified, Patient being monitored, Emergency Drugs available, Timeout performed and Suction available Oxygen Delivery Method: Non-rebreather mask Preoxygenation: Pre-oxygenation with 100% oxygen Induction Type: Rapid sequence Ventilation: Mask ventilation without difficulty Laryngoscope Size: Glidescope and 3 Grade View: Grade I Tube size: 7.5 mm Number of attempts: 1 Airway Equipment and Method: Rigid stylet Placement Confirmation: ETT inserted through vocal cords under direct vision,  CO2 detector and Breath sounds checked- equal and bilateral Secured at: 22 cm Tube secured with: ETT holder Comments: Grade 1 view, 1 attempt, atraumatic, no immediate complications      (including critical care time)  Medications Ordered in ED Medications  rocuronium bromide 100 MG/10ML SOSY (has no administration in time range)  etomidate (AMIDATE) 2 MG/ML injection (has no administration in time range)  LORazepam (ATIVAN) 2 MG/ML injection (has no administration in time range)  midazolam (VERSED) 50 mg/50 mL (1 mg/mL) premix infusion (has no administration in time range)  fentaNYL in NS (19mcg/ml) infusion-PREMIX (has no administration in time range)  sodium chloride 0.9 % bolus 1,000 mL (has no administration in time range)    ED Course  I have reviewed the triage vital signs and the nursing notes.  Pertinent labs & imaging results that were available during my care of the patient were reviewed by me and considered in my medical decision making (see chart for details).  Clinical Course as of Apr 09 1936  Wynelle Link Apr 09, 2020  1926 D/w ICU attending   [RD]    Clinical Course User  Index [RD] Milagros Loll, MD   MDM Rules/Calculators/A&P                          19 year old lady presents to ER after concern for intentional overdose with gabapentin.  Had received Narcan prior to arrival with no improvement in mental status.  No known opiate ingestion.  Per staff, patient was initially combative, but suddenly became unresponsive.  I was called immediately to bedside and when I assessed her, her GCS  was 3.  Proceeded to intubate for airway protection.  RSI without immediate complication.  Vital signs noted for tachycardia.  On exam noted mydriatic pupils.  No clonus or hyperreflexia.  EKG with normal intervals, QTc borderline (474).  On labs noted low level alcohol. Case was reviewed with poison control, recommending supportive care. CXR confirmed good placement of ETT. RN to place OG, foley. Provided update to father and patient sister.  Admit to critical care.  Final Clinical Impression(s) / ED Diagnoses Final diagnoses:  Gabapentin overdose, intentional self-harm, initial encounter (HCC)  Unresponsive  Acute encephalopathy    Rx / DC Orders ED Discharge Orders    None       Milagros Loll, MD 04/09/20 1940

## 2020-04-09 NOTE — ED Notes (Signed)
Pt hard to arouse with use of multiple tactics, provider called to pt bedside. Vitals stable at this time

## 2020-04-09 NOTE — ED Notes (Signed)
Respiratory called to pt bedside

## 2020-04-09 NOTE — Consult Note (Signed)
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 Would recommend EEG as soon as possible to evaluate for epileptiform activity. As well, would start Keppra, in addition to sedation.  CT HEAD: Not Reviewed  Our recommendations are outlined below.  Diagnostic Studies: MRI brain w/wo contrast Stat EEG  Laboratory Studies: Comprehensive Metabolic profile CBC with diff Lactic acid ESR/CRP magnesium, phosphorus AED levels  Medications: Keppra 1gm now Keppra 500 mg BID  Nursing Recommendations: Maintain Euglycemia and Euthermia Neuro checks q1-2 hrs during ICU stay if critical  Seizure precautions: Seizure precautions including no driving for state mandated time frame were discussed with patient with clear understanding  DVT Prophylaxis: Choice of Primary Team  Disposition: Neurology will follow  Free Text:    Additional Recommendations:: Would check EEG when able check MRI brain   Imaging: NCHCT pending  Labs: reviewed  Metrics: [facility] Notification Time: 04/09/2020 20:57:27 Stamp Time: 04/09/2020 20:58:17 Callback Response Time: 04/09/2020 20:59:10   ----------------------------------------------------------------------------------------------------  Chief Complaint:    History of Present Illness:  Pt is a 19 y.o. female with a PMH of MDD with previous episode of intentional OD with benzo. Her history is obtained from the chart as she is intubated and sedated. Pt sent goodbye text to her sister around 4:45 pm and when her sister  arrived at pts apartment, pt was found unresponsive with empty bottle of gabapentin at bedside. Sister called EMS, she is s/p 6m of narcan without evidence to suggest opiate use. Possible EtOH use. Her sister also confirmed prior reports regarding receiving worrisome texts. Patient had access to other medications though she cannot recall which ones. On arrival to the ED, pt's pupils were dilated and fixed, now pinpoint. She is unresponsive to verbal and painful stimulus. She is reported to be having episodes of stimulus induced myoclonus that were not currently witnessed by me. She was awake at one point, she was agitated not answering questions or following commands.    Past Medical History:      There is NO history of Hypertension      There is NO history of Diabetes Mellitus      There is NO history of Hyperlipidemia      There is NO history of Atrial Fibrillation      There is NO history of Coronary Artery Disease      There is NO history of Stroke  Anticoagulant use:  No  Antiplatelet use: No     Examination: BP(112/63), Pulse(106), Blood Glucose(167) 1A: Level of Consciousness - Postures or Unresponsive + 3 1B: Ask Month and Age - Could Not Answer Either Question Correctly + 2 1C: Blink Eyes & Squeeze Hands - Performs Both Tasks + 0 2: Test Horizontal Extraocular Movements - Normal + 0 3: Test Visual Fields - No Visual Loss + 0 4: Test Facial Palsy (Use Grimace if Obtunded) - Normal symmetry + 0 5A: Test Left Arm Motor Drift - No Movement + 4 5B: Test Right Arm Motor Drift - No Movement + 4 6A: Test Left Leg Motor Drift - No Movement + 4 6B: Test Right Leg Motor Drift - No  Movement + 4 7: Test Limb Ataxia (FNF/Heel-Shin) - No Ataxia + 0 8: Test Sensation - Coma/Unresponsive + 2 9: Test Language/Aphasia - Coma/Unresponsive + 3 10: Test Dysarthria - Intubated/Unable to Test + 0 11: Test Extinction/Inattention - No abnormality + 0  NIHSS Score: 26   Patient/Family was  informed the Neurology Consult would occur via TeleHealth consult by way of interactive audio and video telecommunications and consented to receiving care in this manner.  Patient is being evaluated for possible acute neurologic impairment and high probability of imminent or life-threatening deterioration. I spent total of 35 minutes providing care to this patient, including time for face to face visit via telemedicine, review of medical records, imaging studies and discussion of findings with providers, the patient and/or family.   Dr Elzie Rings   [facility] [phone]  Case [id number]

## 2020-04-10 ENCOUNTER — Inpatient Hospital Stay (HOSPITAL_COMMUNITY): Payer: Medicaid Other

## 2020-04-10 DIAGNOSIS — J9610 Chronic respiratory failure, unspecified whether with hypoxia or hypercapnia: Secondary | ICD-10-CM

## 2020-04-10 DIAGNOSIS — R4189 Other symptoms and signs involving cognitive functions and awareness: Secondary | ICD-10-CM

## 2020-04-10 DIAGNOSIS — T50902A Poisoning by unspecified drugs, medicaments and biological substances, intentional self-harm, initial encounter: Secondary | ICD-10-CM | POA: Diagnosis not present

## 2020-04-10 DIAGNOSIS — T426X2A Poisoning by other antiepileptic and sedative-hypnotic drugs, intentional self-harm, initial encounter: Principal | ICD-10-CM

## 2020-04-10 DIAGNOSIS — T426X1A Poisoning by other antiepileptic and sedative-hypnotic drugs, accidental (unintentional), initial encounter: Secondary | ICD-10-CM

## 2020-04-10 DIAGNOSIS — G9340 Encephalopathy, unspecified: Secondary | ICD-10-CM

## 2020-04-10 LAB — MRSA PCR SCREENING: MRSA by PCR: NEGATIVE

## 2020-04-10 LAB — POCT I-STAT 7, (LYTES, BLD GAS, ICA,H+H)
Acid-Base Excess: 1 mmol/L (ref 0.0–2.0)
Bicarbonate: 23 mmol/L (ref 20.0–28.0)
Calcium, Ion: 1.18 mmol/L (ref 1.15–1.40)
HCT: 30 % — ABNORMAL LOW (ref 36.0–46.0)
Hemoglobin: 10.2 g/dL — ABNORMAL LOW (ref 12.0–15.0)
O2 Saturation: 100 %
Patient temperature: 98.9
Potassium: 3.3 mmol/L — ABNORMAL LOW (ref 3.5–5.1)
Sodium: 138 mmol/L (ref 135–145)
TCO2: 24 mmol/L (ref 22–32)
pCO2 arterial: 26.8 mmHg — ABNORMAL LOW (ref 32.0–48.0)
pH, Arterial: 7.543 — ABNORMAL HIGH (ref 7.350–7.450)
pO2, Arterial: 192 mmHg — ABNORMAL HIGH (ref 83.0–108.0)

## 2020-04-10 LAB — HIV ANTIBODY (ROUTINE TESTING W REFLEX): HIV Screen 4th Generation wRfx: NONREACTIVE

## 2020-04-10 MED ORDER — FAMOTIDINE 40 MG/5ML PO SUSR
20.0000 mg | Freq: Two times a day (BID) | ORAL | Status: DC
  Administered 2020-04-10 (×2): 20 mg
  Filled 2020-04-10 (×2): qty 2.5

## 2020-04-10 MED ORDER — LORAZEPAM 2 MG/ML IJ SOLN
INTRAMUSCULAR | Status: AC | PRN
  Administered 2020-04-10: 1 mg via INTRAVENOUS

## 2020-04-10 MED ORDER — LORAZEPAM 2 MG/ML IJ SOLN
4.0000 mg | INTRAMUSCULAR | Status: AC
  Administered 2020-04-10: 4 mg via INTRAVENOUS
  Filled 2020-04-10: qty 2

## 2020-04-10 MED ORDER — LORAZEPAM 2 MG/ML IJ SOLN: 2.0000 mg | INTRAMUSCULAR | Status: DC | PRN

## 2020-04-10 MED ORDER — LORAZEPAM 2 MG/ML IJ SOLN
INTRAMUSCULAR | Status: AC | PRN
  Administered 2020-04-09: 1 mg via INTRAVENOUS

## 2020-04-10 MED ORDER — ROCURONIUM BROMIDE 50 MG/5ML IV SOLN
INTRAVENOUS | Status: AC | PRN
  Administered 2020-04-09: 80 mg via INTRAVENOUS

## 2020-04-10 MED ORDER — CHLORHEXIDINE GLUCONATE CLOTH 2 % EX PADS
6.0000 | MEDICATED_PAD | Freq: Every day | CUTANEOUS | Status: DC
  Administered 2020-04-11 – 2020-04-12 (×2): 6 via TOPICAL

## 2020-04-10 MED ORDER — DIAZEPAM 1 MG/ML PO SOLN
5.0000 mg | Freq: Three times a day (TID) | ORAL | Status: DC
  Administered 2020-04-10 – 2020-04-11 (×3): 5 mg
  Filled 2020-04-10 (×3): qty 5

## 2020-04-10 MED ORDER — POTASSIUM CHLORIDE 10 MEQ/100ML IV SOLN
10.0000 meq | INTRAVENOUS | Status: AC
  Administered 2020-04-10 (×6): 10 meq via INTRAVENOUS
  Filled 2020-04-10 (×6): qty 100

## 2020-04-10 MED ORDER — ETOMIDATE 2 MG/ML IV SOLN
INTRAVENOUS | Status: AC | PRN
  Administered 2020-04-09: 20 mg via INTRAVENOUS

## 2020-04-10 MED ORDER — DEXMEDETOMIDINE HCL IN NACL 400 MCG/100ML IV SOLN
0.2000 ug/kg/h | INTRAVENOUS | Status: DC
  Administered 2020-04-10: 0.2 ug/kg/h via INTRAVENOUS
  Administered 2020-04-11: 0.7 ug/kg/h via INTRAVENOUS
  Filled 2020-04-10 (×2): qty 100

## 2020-04-10 MED ORDER — BUDESONIDE 0.25 MG/2ML IN SUSP
0.2500 mg | Freq: Two times a day (BID) | RESPIRATORY_TRACT | Status: DC
  Administered 2020-04-10 – 2020-04-12 (×5): 0.25 mg via RESPIRATORY_TRACT
  Filled 2020-04-10 (×5): qty 2

## 2020-04-10 MED ORDER — CHLORHEXIDINE GLUCONATE 0.12% ORAL RINSE (MEDLINE KIT)
15.0000 mL | Freq: Two times a day (BID) | OROMUCOSAL | Status: DC
  Administered 2020-04-10 (×2): 15 mL via OROMUCOSAL

## 2020-04-10 MED ORDER — ORAL CARE MOUTH RINSE
15.0000 mL | OROMUCOSAL | Status: DC
  Administered 2020-04-10 – 2020-04-11 (×9): 15 mL via OROMUCOSAL

## 2020-04-10 MED FILL — Medication: Qty: 1 | Status: AC

## 2020-04-10 NOTE — ED Notes (Signed)
[facility] called for pt transfer to [facility]

## 2020-04-10 NOTE — Progress Notes (Addendum)
Subjective: No clinical seizures this morning.  Patient's father at bedside states she has tried committing suicide by overdosing on medications in the past as well.  States she just turned 19 so he does not think that she drinks alcohol daily.  ROS: unable to obtain due to poor mental status  Examination  Vital signs in last 24 hours: Temp:  [95.7 F (35.4 C)-98.9 F (37.2 C)] 98 F (36.7 C) (11/15 0800) Pulse Rate:  [82-146] 130 (11/15 0900) Resp:  [13-29] 14 (11/15 0900) BP: (104-154)/(59-114) 128/98 (11/15 0900) SpO2:  [99 %-100 %] 100 % (11/15 0900) FiO2 (%):  [40 %-100 %] 40 % (11/15 0739) Weight:  [49.4 kg] 49.4 kg (11/15 0309)  General: lying in bed, NAD CVS: pulse-normal rate and rhythm RS: breathing comfortably, intubated Extremities: normal, wamr  Neuro: Does not open eyes to repeated tactile stimuli, squeezes hands on command but is unable to lift her thumb or show 2 fingers, pupils equally round and reactive to light, no forced gaze deviation, moving all 4 extremities spontaneously in bed  Basic Metabolic Panel: Recent Labs  Lab 04/09/20 1816 04/09/20 Sep 11, 2134 04/10/20 0442  NA 141 139 138  K 3.3* 3.3* 3.3*  CL 104 106  --   CO2 22 21*  --   GLUCOSE 167* 149*  --   BUN 10 9  --   CREATININE 0.49 0.57  --   CALCIUM 8.9 8.4*  --   MG  --  1.7  --     CBC: Recent Labs  Lab 04/09/20 1816 04/10/20 0442  WBC 7.8  --   NEUTROABS 5.4  --   HGB 13.5 10.2*  HCT 41.8 30.0*  MCV 93.5  --   PLT 221  --      Coagulation Studies: Recent Labs    04/09/20 2134/09/11  LABPROT 13.0  INR 1.0    Imaging CTH wo contrast 04/10/2020: Normal examination   ASSESSMENT AND PLAN: 19 year old female with past medical history of anxiety, migraines who presented with intentional overdose on gabapentin and possibly lamotrigine.  Ethanol level on arrival was 35.  Urine drug screen was positive for THC.   Suspected intentional overdose Alcohol use disorder with alcohol  toxicity Cannabis use disorder Hypokalemia Hyperglycemia -LTM EEG is consistent with sedation, no evidence of ictal-interictal activity -It is possible that patient had myoclonus due to gabapentin toxicity.   Recommendations -Okay to wean off sedation as EEG did not show any evidence of seizures. -Patient is supposed to be on Valium at home.  Given high alcohol level, would recommend starting patient on  Valium 5 mg every 8 hours, can be increased if needed -Okay to use Precedex for agitation if needed -We will consider resuming gabapentin once patient is more awake and alert.  Gabapentin does have a short half-life so I anticipate that it will be out of her system in couple days. -Continue seizure precautions -As needed IV Ativan 2 mg for clinical seizure-like activity -No need to obtain MRI brain yet but will consider if patient has any focal deficits after sedation is weaned off -Management of rest of the comorbidities per primary team -Discussed plan in detail with patient's father at bedside, NP Zenia Resides as well as patient's RN.  CRITICAL CARE Performed by: Charlsie Quest   Total critical care time: 35 minutes  Critical care time was exclusive of separately billable procedures and treating other patients.  Critical care was necessary to treat or prevent imminent or life-threatening deterioration.  Critical care was time spent personally by me on the following activities: development of treatment plan with patient and/or surrogate as well as nursing, discussions with consultants, evaluation of patient's response to treatment, examination of patient, obtaining history from patient or surrogate, ordering and performing treatments and interventions, ordering and review of laboratory studies, ordering and review of radiographic studies, pulse oximetry and re-evaluation of patient's condition.   Lindie Spruce Epilepsy [facility] For questions after 5pm please  refer to AMION to reach the Neurologist on call

## 2020-04-10 NOTE — Progress Notes (Signed)
eLink Physician-Brief Progress Note Patient Name: Alexis [name] DOB: 2001-03-09 MRN: [id number]   Date of Service  04/10/2020  HPI/Events of Note  Agitation - Request for bilateral soft wrist restraints.   eICU Interventions  Will order bilateral soft wrist restraints X 8 hours.      Intervention Category Major Interventions: Delirium, psychosis, severe agitation - evaluation and management  Harman Ferrin Eugene 04/10/2020, 3:04 AM

## 2020-04-10 NOTE — Progress Notes (Signed)
This RT called Toniann Fail RT at [facility] and gave report. [facility] here at [facility] to transport patient.

## 2020-04-10 NOTE — Progress Notes (Signed)
LTM EEG hooked up and running - no initial skin breakdown - push button tested - neuro notified.  Same leads used.  

## 2020-04-10 NOTE — Progress Notes (Signed)
Pt arrived from [facility] via [place] with the following belongings:  Pink tshirt, tan bra, black underwear, striped socks. NO other belongings.

## 2020-04-10 NOTE — ED Notes (Signed)
Pts father and older sister at bedside.

## 2020-04-10 NOTE — Procedures (Signed)
Patient Name: CHIDINMA CLITES  MRN: [id number]  Epilepsy Attending: Charlsie Quest  Referring Physician/Provider: Dr. Peterson Lombard Date: 04/10/2020 Duration: 25.34 minutes  Patient history: 19 year old female who presented with intentional overdose of gabapentin and potentially lamotrigine on exam was noted to be altered with hyperreflexia.  EEG to evaluate for seizures.  Level of alertness: Asleep/sedated  AEDs during EEG study: Versed  Technical aspects: This EEG study was done with scalp electrodes positioned according to the 10-20 International system of electrode placement. Electrical activity was acquired at a sampling rate of 500Hz  and reviewed with a high frequency filter of 70Hz  and a low frequency filter of 1Hz . EEG data were recorded continuously and digitally stored.   Description: EEG showed continuous 3 to 5 Hz theta-delta slowing admixed with 15 to 18 Hz, at times sharply contoured generalized beta activity. Hyperventilation and photic stimulation were not performed.     ABNORMALITY -Continuous slow, generalized  IMPRESSION: This study is suggestive of severe diffuse encephalopathy, nonspecific etiology but most likely secondary to sedation.  No seizures or epileptiform discharges were seen throughout the recording.  Cyrus Ramsburg 

## 2020-04-10 NOTE — Consult Note (Signed)
NEUROLOGY CONSULTATION NOTE   Date of service: April 10, 2020 Patient Name: Alexis [name] MRN:  [id number] DOB:  June 20, 2000 Reason for consult: "Overdose with episodes concerning for myoclonus vs seizures"  History of Present Illness  Alexis [name] is a 19 y.o. female with PMH significant for Anxiety, Asthma, Depression, GERD (gastroesophageal reflux disease), and Migraines who presents with intentional overdose. Patient unable to provide history. Per notes, patient was found unresponsive next to an empty bottle of gabapentin and lamictal. Et OH levels were 35 at presentation. She was given 4mg  of Narcan.  She was reported to be combative but non verbal on arrival to the ED and became unarousable and was intubated.  Poison control recommended supportive care.  Patient was noted to have stimulus induced brief movements concerning for seizures vs myoclonus. Telenruology was consulted and recommended loading with Keppra and transfer to [facility] for a STAT cEEG.   ROS  Unable to obtain PMH, PSH, FHx, SHx, ROS due to intubation. No family at bedside. Past History   Past Medical History:  Diagnosis Date   Anxiety    Asthma    Depression    GERD (gastroesophageal reflux disease)    Migraines    Past Surgical History:  Procedure Laterality Date   CYSTO WITH HYDRODISTENSION N/A 04/10/2018   Procedure: CYSTOSCOPY/HYDRODISTENSION;  Surgeon: 04/12/2018, MD;  Location: [facility];  Service: Urology;  Laterality: N/A;   UPPER GI ENDOSCOPY     Family History  Problem Relation Age of Onset   Asthma Mother    Thyroid disease Mother    Healthy Sister    Social History   Socioeconomic History   Marital status: Single    Spouse name: Not on file   Number of children: 0   Years of education: 14   Highest education level: Some college, no degree  Occupational History   Not on file  Tobacco Use   Smoking status: Never Smoker   Smokeless  tobacco: Never Used  [facility] Use: Never used  Substance and Sexual Activity   Alcohol use: Never   Drug use: Never   Sexual activity: Yes    Birth control/protection: Pill  Other Topics Concern   Not on file  Social History Narrative   Not on file   Social Determinants of Health   Financial Resource Strain:    Difficulty of Paying Living Expenses: Not on file  Food Insecurity:    Worried About Building services engineer in the Last Year: Not on file   Programme researcher, broadcasting/film/video of Food in the Last Year: Not on file  Transportation Needs:    Lack of Transportation (Medical): Not on file   Lack of Transportation (Non-Medical): Not on file  Physical Activity:    Days of Exercise per Week: Not on file   Minutes of Exercise per Session: Not on file  Stress:    Feeling of Stress : Not on file  Social Connections:    Frequency of Communication with Friends and Family: Not on file   Frequency of Social Gatherings with Friends and Family: Not on file   Attends Religious Services: Not on file   Active Member of Clubs or Organizations: Not on file   Attends [employer] Meetings: Not on file   Marital Status: Not on file   Allergies  Allergen Reactions   Other Other (See Comments)    All acidic foods cause extreme bladder pain  Medications   Medications Prior to Admission  Medication Sig Dispense Refill Last Dose   albuterol (PROVENTIL HFA;VENTOLIN HFA) 108 (90 Base) MCG/ACT inhaler Inhale 1-2 puffs into the lungs every 6 (six) hours as needed for wheezing or shortness of breath.      diazepam (VALIUM) 10 MG tablet Take 1 tablet (10 mg total) by mouth at bedtime. For severe anxiety 5 tablet 0    escitalopram (LEXAPRO) 10 MG tablet Take 10 mg by mouth daily. (Patient not taking: Reported on 03/02/2020)      famotidine (PEPCID) 20 MG tablet Take 1 tablet (20 mg total) by mouth daily. For acid reflux (Patient not taking: Reported on 07/22/2019) 5 tablet 0     fluconazole (DIFLUCAN) 150 MG tablet Take 1 tablet (150 mg total) by mouth daily. May repeat in 72 hours if needed 2 tablet 0    fluticasone (FLOVENT HFA) 110 MCG/ACT inhaler Inhale 1 puff into the lungs daily.       gabapentin (NEURONTIN) 100 MG capsule Take 100 mg by mouth 2 (two) times daily.       hydrOXYzine (ATARAX/VISTARIL) 25 MG tablet Take 1 tablet (25 mg total) by mouth every 8 (eight) hours as needed for anxiety. 60 tablet 0    lamoTRIgine (LAMICTAL) 100 MG tablet Take 100 mg by mouth daily.      lamoTRIgine (LAMICTAL) 25 MG tablet Take 100 mg by mouth at bedtime.      meloxicam (MOBIC) 15 MG tablet Take 1 tablet (15 mg total) by mouth daily. For pain (Patient not taking: Reported on 03/02/2020) 5 tablet 0    mirabegron ER (MYRBETRIQ) 25 MG TB24 tablet Take 25 mg by mouth daily. (Patient not taking: Reported on 03/02/2020)      Norgestimate-Ethinyl Estradiol Triphasic (TRI-LO-MARZIA) 0.18/0.215/0.25 MG-25 MCG tab Take 1 tablet by mouth at bedtime. Birth control method 1 Package 11    oxybutynin (DITROPAN) 5 MG tablet Take 5 mg by mouth 3 (three) times daily.      phenazopyridine (PYRIDIUM) 100 MG tablet Take 1 tablet (100 mg total) by mouth 3 (three) times daily. For bladder spasms 15 tablet 0    phenazopyridine (PYRIDIUM) 200 MG tablet Take 1 tablet (200 mg total) by mouth daily as needed (urinary symptoms). 1 tablet 0    sertraline (ZOLOFT) 25 MG tablet Take 1 tablet (25 mg total) by mouth daily. For depression (Patient not taking: Reported on 07/22/2019) 30 tablet 0    traMADol (ULTRAM) 50 MG tablet Take 1 tablet (50 mg total) by mouth every 8 (eight) hours as needed for moderate pain. (Patient not taking: Reported on 03/02/2020) 1 tablet 0      Vitals   Vitals:   04/10/20 0000 04/10/20 0038 04/10/20 0115 04/10/20 0235  BP: 115/80 (!) 104/59 110/62 105/75  Pulse: 92 (!) 102 97 93  Resp: 16 13 16 16   Temp: 98.1 F (36.7 C)  98.2 F (36.8 C) 98.9 F (37.2 C)  TempSrc:     Axillary  SpO2: 100% 100% 100% 100%  Height:         Body mass index is 17.43 kg/m.  Physical Exam   General: Laying in bed, intubated. HENT: Normal oropharynx and mucosa. Normal external appearance of ears and nose. Neck: Supple, no pain or tenderness CV: No JVD. No peripheral edema. Pulmonary: Symmetric Chest rise. Normal respiratory effort. Abdomen: Soft to touch, non-tender. Ext: No cyanosis, edema, or deformity Skin: No rash. Normal palpation of skin.  Musculoskeletal: Normal digits  and nails by inspection. No clubbing.  Neurologic Examination on Versed and Fentanyl.  Mental status/Cognition: No response to voice, loud clap, with noxious stimuli she curls up into fetal position mostly to her left. No abnormal twitch/jerk concerning for a seizure or myoclonus.  Brainstem reflexes: Pupils: 41mm BL, equal round and reactive to light. Corneal: + BL Cough: absent Gag: positive.  Motor/sensory: no movement in any extremities to noxious stimuli but she does curl up into fetal position with stimuli.  Reflexes:  Right Left Comments  Pectoralis      Biceps (C5/6) 2+ 2+   Brachioradialis (C5/6) 2+ 2+    Triceps (C6/7) 2+ 2+    Patellar (L3/4) 3 3 Cross adductors + BL   Achilles (S1) 4 4 2-3 beats of clonus BL   Hoffman + +    Plantar mute mute   Jaw jerk     Labs   CBC:  Recent Labs  Lab 04/09/20 1816  WBC 7.8  NEUTROABS 5.4  HGB 13.5  HCT 41.8  MCV 93.5  PLT 221    Basic Metabolic Panel:  Lab Results  Component Value Date   NA 139 04/09/2020   K 3.3 (L) 04/09/2020   CO2 21 (L) 04/09/2020   GLUCOSE 149 (H) 04/09/2020   BUN 9 04/09/2020   CREATININE 0.57 04/09/2020   CALCIUM 8.4 (L) 04/09/2020   GFRNONAA >60 04/09/2020   GFRAA >60 04/22/2018   Lipid Panel: No results found for: LDLCALC HgbA1c: No results found for: HGBA1C Urine Drug Screen:     Component Value Date/Time   LABOPIA NONE DETECTED 04/09/2020 2136   COCAINSCRNUR NONE DETECTED  04/09/2020 2136   LABBENZ NONE DETECTED 04/09/2020 2136   AMPHETMU NONE DETECTED 04/09/2020 2136   THCU POSITIVE (A) 04/09/2020 2136   LABBARB NONE DETECTED 04/09/2020 2136    Alcohol Level     Component Value Date/Time   ETH 35 (H) 04/09/2020 1816     CTH without contrast: Pending.   Impression   ALLAINA BROTZMAN is a 19 y.o. female with PMH significant for  Anxiety, Asthma, Depression, GERD (gastroesophageal reflux disease), and Migraines who presents with intentional overdo on Gabapentin and ?lamitcal but maybe other medications too. Her neurologic examination is notable for obtunded mentation and poor neuro exam on Versed and Fentanyl with hyperreflexia. There were concerns for abnormal movements concerning for myoclonus vs seizures. However, none that I noted on my exam. Given poor responsiveness and initial concerns for seizures, we will hook her upto cEEG to rule out seizures.  Recommendations  - Agree with CTH without contrast - cEEG ordered and tech notified. ______________________________________________________________________   Thank you for the opportunity to take part in the care of this patient. If you have any further questions, please contact the neurology consultation attending.  Signed,  Erick Blinks [facility] Pager Number [phone]

## 2020-04-10 NOTE — Progress Notes (Signed)
RT obtained ABG on pt with the following results. RT will continue to monitor.   Results for Alexis [name], Alexis [name] (MRN [id number]) as of 04/10/2020 05:21  Ref. Range 04/10/2020 04:42  Sample type Unknown ARTERIAL  pH, Arterial Latest Ref Range: 7.35 - 7.45  7.543 (H)  pCO2 arterial Latest Ref Range: 32 - 48 mmHg 26.8 (L)  pO2, Arterial Latest Ref Range: 83 - 108 mmHg 192 (H)  TCO2 Latest Ref Range: 22 - 32 mmol/L 24  Acid-Base Excess Latest Ref Range: 0.0 - 2.0 mmol/L 1.0  Bicarbonate Latest Ref Range: 20.0 - 28.0 mmol/L 23.0  O2 Saturation Latest Units: % 100.0  Patient temperature Unknown 98.9 F  Collection site Unknown Radial

## 2020-04-10 NOTE — ED Notes (Signed)
Versed titrated upward, pt moving around and attempting to yank at tubes

## 2020-04-10 NOTE — Progress Notes (Signed)
NAME:  Alexis [name], MRN:  [id number], DOB:  2000/10/05, LOS: 1 ADMISSION DATE:  04/09/2020, CONSULTATION DATE:  04/09/20 REFERRING MD:  Dr. Stevie Kern, CHIEF COMPLAINT:  encephalopathy   Brief History   19 year old woman here with acute encephalopathy and agitation after intentional overdose. Became less responsive after initially combative. Had stimulus induced myoclonic activity   Found unresponsive by sister with empty bottle of gabapentin.  She also found empty bottle of lamictal.  ETOH level 35 mg/dl.    She also has access to diazepam, escitalopram, certraline, hydroxizine, meloxicam,  and tramadol.  Past Medical History  Anxiety, Asthma, Depression, GERD Migraines, interstitial cystitis, Back pain, chronic, Pelvic pain, Vaginal yeast infections  Significant Hospital Events     Consults:  Neurology  Procedures:  Intubation 04/09/20  Significant Diagnostic Tests:  CT head 11/14: normal  EEG/LTM 11/15: study is suggestive of severe diffuse encephalopathy, nonspecific etiology but most likely secondary to sedation.  No seizures or epileptiform discharges   Micro Data:    Antimicrobials:    Interim history/subjective:   More awake  Objective   Blood pressure (Abnormal) 128/98, pulse (Abnormal) 130, temperature 98 F (36.7 C), temperature source Axillary, resp. rate 14, height 5\' 6"  (1.676 m), weight 49.4 kg, SpO2 100 %.    Vent Mode: PRVC FiO2 (%):  [40 %-100 %] 40 % Set Rate:  [14 bmp-20 bmp] 14 bmp Vt Set:  [430 mL-470 mL] 470 mL PEEP:  [5 cmH20] 5 cmH20 Plateau Pressure:  [11 cmH20-12 cmH20] 12 cmH20   Intake/Output Summary (Last 24 hours) at 04/10/2020 1015 Last data filed at 04/10/2020 0800 Gross per 24 hour  Intake 3373.66 ml  Output 300 ml  Net 3073.66 ml   Filed Weights   04/10/20 0309  Weight: 49.4 kg    Examination: General otherwise healthy 19 year old white female sedated on vent HENT NCAT no JVD pulm clear excellent cough. Triggers apnea  alarm but has good VT on PSV Card RRR tachycardic at times abd soft Neuro will follow commands. Moves ext. Sits up. Reaches for tube Ext warm and dry  gu cl yellow   Resolved Hospital Problem list     Assessment & Plan:  acute toxic and metabolic encephalopathy w/ myoclonic activity : Acute intentional drug overdose.  Likely took large amount gabapentin and lamotrigine.  Also possibly has access to diazepam, escitalopram, certraline, hydroxizine, meloxicam,  and tramadol.  Unk ingestion overall.  -EEG negative, CT neg Plan Cont supportive care IV hydration  Seizure precautions Holding Lamictal and Neurontin for now. She has been loaded w/ Keppra, but this was not continued. Will defer to Neurology at some point she will be at risk of wd from the neurontin. Not sure about the lamictal-->these may need to be added back Will check serial lamictal levels PAD protocol RASS goal 0 Add scheduled valium 5mg  q 8 Cont LTM She will need [facility] consult  Respiratory failure due to ineffective airway protection Plan Cont full vent support PAD protocol VAP bundle Anticipate rapid extubation in next hour or so  Hypokalemia Plan Replaced and recheck     Best practice:  Diet: NPO for now, maybe extubatable in AM.  Pain/Anxiety/Delirium protocol (if indicated): fentanyl/versed.  VAP protocol (if indicated):  DVT prophylaxis: heparin GI prophylaxis: pepcid Glucose control: prn Mobility: bedrest Code Status: Full  Family Communication: Discussed with father and sister at bedside.  Disposition: ICU  Critical care time: 32 min      ACNP-BC [place] Pager # [phone] OR # [phone] if no answer

## 2020-04-10 NOTE — ED Notes (Signed)
Report called to ciarra rn @ [facility] ICU for pt transfer

## 2020-04-10 NOTE — ED Notes (Signed)
[facility]link at pt bedside

## 2020-04-10 NOTE — Progress Notes (Signed)
STAT EEG complete - results pending.LTM to follow same leads used   

## 2020-04-10 NOTE — Procedures (Signed)
Extubation Procedure Note  Patient Details:   Name: Alexis [name] DOB: April 14, 2001 MRN: [id number]   Airway Documentation:   Patient extubated per orders. Patient had positive cuff leak prior to extubation. Patient on room air with sats of 99%. Will continue to monitor.  Vent end date: 04/10/20 Vent end time: 1359   Evaluation  O2 sats: stable throughout Complications: No apparent complications Patient did tolerate procedure well. Bilateral Breath Sounds: Clear, Diminished   Yes  Suszanne Conners 04/10/2020, 2:00 PM

## 2020-04-10 NOTE — Progress Notes (Signed)
Pt transported from 4N21 to CT and back w/out complication. Pt respiratory status stable throughout transport. RT will continue to monitor.

## 2020-04-11 DIAGNOSIS — T50902A Poisoning by unspecified drugs, medicaments and biological substances, intentional self-harm, initial encounter: Secondary | ICD-10-CM | POA: Diagnosis not present

## 2020-04-11 LAB — COMPREHENSIVE METABOLIC PANEL
ALT: 9 U/L (ref 0–44)
AST: 17 U/L (ref 15–41)
Albumin: 3.1 g/dL — ABNORMAL LOW (ref 3.5–5.0)
Alkaline Phosphatase: 41 U/L (ref 38–126)
Anion gap: 10 (ref 5–15)
BUN: <5 mg/dL — ABNORMAL LOW (ref 6–20)
CO2: 25 mmol/L (ref 22–32)
Calcium: 8.6 mg/dL — ABNORMAL LOW (ref 8.9–10.3)
Chloride: 104 mmol/L (ref 98–111)
Creatinine, Ser: 0.69 mg/dL (ref 0.44–1.00)
GFR, Estimated: >60 mL/min (ref >60)
Glucose, Bld: 118 mg/dL — ABNORMAL HIGH (ref 70–99)
Potassium: 3.2 mmol/L — ABNORMAL LOW (ref 3.5–5.1)
Sodium: 139 mmol/L (ref 135–145)
Total Bilirubin: 0.4 mg/dL (ref 0.3–1.2)
Total Protein: 5.2 g/dL — ABNORMAL LOW (ref 6.5–8.1)

## 2020-04-11 LAB — LAMOTRIGINE LEVEL
Lamotrigine Lvl: 37.9 ug/mL — ABNORMAL HIGH (ref 2.0–20.0)
Lamotrigine Lvl: 31 ug/mL — ABNORMAL HIGH (ref 2.0–20.0)

## 2020-04-11 LAB — PROLACTIN: Prolactin: 33.8 ng/mL — ABNORMAL HIGH (ref 4.8–23.3)

## 2020-04-11 MED ORDER — FAMOTIDINE 20 MG PO TABS
20.0000 mg | ORAL_TABLET | Freq: Two times a day (BID) | ORAL | Status: DC
  Administered 2020-04-11 (×2): 20 mg via ORAL
  Filled 2020-04-11 (×2): qty 1

## 2020-04-11 MED ORDER — POTASSIUM CHLORIDE 10 MEQ/100ML IV SOLN
10.0000 meq | INTRAVENOUS | Status: AC
  Administered 2020-04-11 (×3): 10 meq via INTRAVENOUS
  Filled 2020-04-11 (×3): qty 100

## 2020-04-11 MED ORDER — ONDANSETRON HCL 4 MG/2ML IJ SOLN
4.0000 mg | Freq: Four times a day (QID) | INTRAMUSCULAR | Status: DC | PRN
  Administered 2020-04-11: 4 mg via INTRAVENOUS

## 2020-04-11 MED ORDER — POTASSIUM CHLORIDE CRYS ER 20 MEQ PO TBCR
20.0000 meq | EXTENDED_RELEASE_TABLET | ORAL | Status: AC
  Administered 2020-04-11 (×2): 20 meq via ORAL
  Filled 2020-04-11 (×2): qty 1

## 2020-04-11 MED ORDER — POTASSIUM CHLORIDE 20 MEQ/15ML (10%) PO SOLN: 20.0000 meq | ORAL | Status: DC

## 2020-04-11 MED ORDER — MAGNESIUM SULFATE 2 GM/50ML IV SOLN
2.0000 g | Freq: Once | INTRAVENOUS | Status: AC
  Administered 2020-04-11: 2 g via INTRAVENOUS
  Filled 2020-04-11: qty 50

## 2020-04-11 MED ORDER — DIAZEPAM 5 MG PO TABS
5.0000 mg | ORAL_TABLET | Freq: Three times a day (TID) | ORAL | Status: DC
  Administered 2020-04-11 – 2020-04-12 (×4): 5 mg via ORAL
  Filled 2020-04-11 (×4): qty 1

## 2020-04-11 NOTE — Evaluation (Signed)
Physical Therapy Evaluation Patient Details Name: Alexis [name] MRN: [id number] DOB: 10/07/2000 Today's Date: 04/11/2020   History of Present Illness  Patient is a 19 y/o female who was found unresponsive at home due to an intentional overdose of gabapentin. + ETOH, + THC. Intubated 11/14-11/15.  It is possible that patient had myoclonus due to gabapentin toxicity. PMH includes depression, anxiety, migraines.  Clinical Impression  Patient presents with generalized weakness, impaired balance, ataxia, double vision, and impaired mobility s/p above. Pt is independent for ADLs/IADLs and lives with roommates PTA. 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 Family lives close by per father. Pt was also going to OPPT PTA. Today, pt requires Min-Mod A for bed mobility, transfers and stepping along side bed due to ataxia, impaired balance and safety. Poor activity tolerance today. Pt noted to have slower processing, which dad confirms. Will follow acutely to maximize independence and mobility prior to return home.    Follow Up Recommendations Other (comment);Supervision for mobility/OOB;Supervision/Assistance - 24 hour (likely inpatient psych)    Equipment Recommendations  Other (comment) (TBA pending improvement)    Recommendations for Other Services       Precautions / Restrictions Precautions Precautions: Fall Restrictions Weight Bearing Restrictions: No      Mobility  Bed Mobility Overal bed mobility: Needs Assistance Bed Mobility: Supine to Sit     Supine to sit: Min assist;HOB elevated     General bed mobility comments: Min A to help with trunk to get to EOB, Mod A once EOB due to truncal/UEs ataxia. Improved wtih approximation    Transfers Overall transfer level: Needs assistance Equipment used: 1 person hand held assist Transfers: Sit to/from Stand Sit to Stand: Min assist         General transfer comment: Pt impulsively standing when asked to scoot bottom forward, Min A  for balance due to ataxia and unsteadiness.  Ambulation/Gait Ambulation/Gait assistance: Mod assist;+2 physical assistance Gait Distance (Feet): 4 Feet Assistive device: 2 person hand held assist Gait Pattern/deviations: Ataxic Gait velocity: decreased   General Gait Details: Able to side step along side bed with ataxic like movements throughout extremities/trunk, needed Mod A of 2 for balance and safety. Partial LOB.  Stairs            Wheelchair Mobility    Modified Rankin (Stroke Patients Only)       Balance Overall balance assessment: Needs assistance Sitting-balance support: Feet supported;Bilateral upper extremity supported Sitting balance-Leahy Scale: Poor Sitting balance - Comments: Requiring mod A for support due to ataxia, requires approximation through trunk for pt to be able to use UEs to donn socks reaching outside BoS. Posterior pelvic tilt and flexed posturing.   Standing balance support: During functional activity Standing balance-Leahy Scale: Poor Standing balance comment: Requires external support for standing balance. Mod A of 2 for dynamic tasks.                             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 Was seeing OPPT for bladder retraining, back pain and LE strengthening.     Hand Dominance        Extremity/Trunk Assessment   Upper Extremity Assessment Upper Extremity Assessment: Defer to OT evaluation  (Ataxia present BUEs esp when not WB, improved with approximation)    Lower Extremity Assessment Lower Extremity Assessment: Generalized weakness    Cervical / Trunk Assessment Cervical / Trunk Assessment: Other exceptions Cervical / Trunk Exceptions: ataxia noted throughout trunk with movement  Communication   Communication: No difficulties  Cognition Arousal/Alertness: Awake/alert Behavior During Therapy: Impulsive Overall Cognitive Status: Impaired/Different from baseline Area of Impairment: Problem solving;Following commands                       Following Commands: Follows one step commands consistently (with repetition at times)     Problem Solving: Slow processing;Requires verbal cues;Requires tactile cues General Comments: Father reports slower processing than normal. Memory appears intact. Following commands with some repetition needed. Impulsive at times.      General Comments General comments (skin integrity, edema, etc.): Father and sitter present during session.    Exercises     Assessment/Plan    PT Assessment Patient needs continued PT services  PT Problem List Decreased strength;Decreased mobility;Decreased coordination;Decreased activity tolerance;Decreased cognition;Decreased balance;Decreased knowledge of use of DME       PT Treatment Interventions Therapeutic activities;Gait training;Therapeutic exercise;Patient/family education;Balance training;Functional mobility training;Neuromuscular re-education;Stair training;DME instruction;Cognitive remediation    PT Goals (Current goals can be found in the Care Plan section)  Acute Rehab PT Goals Patient Stated Goal: "to lay down" PT Goal Formulation: With patient Time For Goal Achievement: 04/25/20 Potential to Achieve Goals: Fair    Frequency Min 3X/week   Barriers to discharge Decreased caregiver support      Co-evaluation PT/OT/SLP Co-Evaluation/Treatment: Yes Reason for Co-Treatment:  For patient/therapist safety;To address functional/ADL transfers PT goals addressed during session: Mobility/safety with mobility;Balance;Strengthening/ROM         AM-PAC PT "6 Clicks" Mobility  Outcome Measure Help needed turning from your back to your side while in a flat bed without using bedrails?: A Little Help needed moving from lying on your back to sitting on the side of a flat bed without using bedrails?: A Little Help needed moving to and from a bed to a chair (including a wheelchair)?: A Lot Help needed standing up from a chair using your arms (e.g., wheelchair or bedside chair)?: A Lot Help needed to walk in hospital room?: A Lot Help needed climbing 3-5 steps with a railing? : A Lot 6 Click Score: 14    End of Session   Activity Tolerance: Patient tolerated treatment well;Patient limited by fatigue Patient left: in bed;with call bell/phone within reach;with bed alarm set;with family/visitor present;with nursing/sitter in room Nurse Communication: Mobility status PT Visit Diagnosis: Difficulty in walking, not elsewhere classified (R26.2);Unsteadiness on feet (R26.81);Ataxic gait (R26.0)    Time: 1400-1415 PT Time Calculation (min) (ACUTE ONLY): 15 min   Charges:   PT Evaluation $PT Eval Moderate Complexity: 1 Mod          Vale Haven, PT, DPT Acute Rehabilitation Services Pager [phone] Office [phone]      Blake Divine A Lanier Ensign 04/11/2020, 3:19 PM

## 2020-04-11 NOTE — Procedures (Addendum)
Patient Name: Alexis [name]  MRN: [id number]  Epilepsy Attending: Charlsie Quest  Referring Physician/Provider: Dr. Erick Blinks Duration: 04/10/2020 6770 to 04/11/2020 3403  Patient history: 19 year old female who presented with intentional overdose of gabapentin and potentially lamotrigine on exam was noted to be altered with hyperreflexia.  EEG to evaluate for seizures.  Level of alertness: Awake,  Asleep  AEDs during EEG study: Valium  Technical aspects: This EEG study was done with scalp electrodes positioned according to the 10-20 International system of electrode placement. Electrical activity was acquired at a sampling rate of 500Hz  and reviewed with a high frequency filter of 70Hz  and a low frequency filter of 1Hz . EEG data were recorded continuously and digitally stored.   Description:  During awake state, no posterior dominant rhythm was seen.  Sleep was characterized by vertex waves, sleep spindles (12 to 14 Hz), maximal frontocentral region.  EEG showed continuous generalized 3 to 5 Hz theta-delta slowing admixed with 15 to 18 Hz generalized beta activity. Hyperventilation and photic stimulation were not performed.     ABNORMALITY -Continuous slow, generalized  IMPRESSION: This study is suggestive of moderate diffuse encephalopathy, nonspecific etiology but most likely secondary to sedation.  No seizures or epileptiform discharges were seen throughout the recording.  EEG appears to be improving compared to previous day.  Alexis [name] 

## 2020-04-11 NOTE — Progress Notes (Signed)
   NAME:  Alexis [name], MRN:  [id number], DOB:  March 19, 2001, LOS: 2 ADMISSION DATE:  04/09/2020, CONSULTATION DATE:  04/09/20 REFERRING MD:  Dr. Stevie Kern, CHIEF COMPLAINT:  encephalopathy   Brief History   19 year old woman here with acute encephalopathy and agitation after intentional overdose. Became less responsive after initially combative. Had stimulus induced myoclonic activity   Found unresponsive by sister with empty bottle of gabapentin.  She also found empty bottle of lamictal.  ETOH level 35 mg/dl.    She also has access to diazepam, escitalopram, certraline, hydroxizine, meloxicam,  and tramadol.  Past Medical History  Anxiety, Asthma, Depression, GERD Migraines, interstitial cystitis, Back pain, chronic, Pelvic pain, Vaginal yeast infections  Significant Hospital Events     Consults:  Neurology  Procedures:  Intubation 04/09/20  Significant Diagnostic Tests:  CT head 11/14: normal  EEG/LTM 11/15: study is suggestive of severe diffuse encephalopathy, nonspecific etiology but most likely secondary to sedation.  No seizures or epileptiform discharges   Micro Data:    Antimicrobials:    Interim history/subjective:   No extubating and interactive. Objective   Blood pressure 107/79, pulse (!) 119, temperature 98.4 F (36.9 C), temperature source Axillary, resp. rate (!) 22, height 5\' 6"  (1.676 m), weight 49.4 kg, SpO2 99 %.    Vent Mode: PRVC FiO2 (%):  [21 %-40 %] 21 % Set Rate:  [14 bmp] 14 bmp Vt Set:  [470 mL] 470 mL PEEP:  [5 cmH20] 5 cmH20   Intake/Output Summary (Last 24 hours) at 04/11/2020 0846 Last data filed at 04/11/2020 0800 Gross per 24 hour  Intake 2814.44 ml  Output 2250 ml  Net 564.44 ml   Filed Weights   04/10/20 0309  Weight: 49.4 kg    Examination: General: Well-nourished well-developed female no acute distress HEENT: NG tube in place will be removed Neuro: Grossly intact without focal defect there was strange affect CV: Heart  sounds are regular PULM: Lungs are clear to auscultation  GI: soft, bsx4 active  Extremities: warm/dry,  edema  Skin: no rashes or lesions   Resolved Hospital Problem list     Assessment & Plan:  acute toxic and metabolic encephalopathy w/ myoclonic activity : Acute intentional drug overdose.  Likely took large amount gabapentin and lamotrigine.  Also possibly has access to diazepam, escitalopram, certraline, hydroxizine, meloxicam,  and tramadol.  Unk ingestion overall.  -EEG negative, CT neg 04/11/2020 patient denies intentional suicide attempt Plan Now extubating We will call psychiatric consult Appreciate neurological's input Continue LTM   Respiratory failure due to ineffective airway protection Plan Extubate and in no acute respiratory distress  Hypokalemia Recent Labs  Lab 04/09/20 2136 04/10/20 0442 04/11/20 0354  K 3.3* 3.3* 3.2*    Plan Monitor replete as needed    Best practice:  Diet: NPO for now, maybe extubatable in AM.  Pain/Anxiety/Delirium protocol (if indicated): Off sedation VAP protocol (if indicated):  DVT prophylaxis: heparin GI prophylaxis: pepcid Glucose control: prn Mobility: Out of bed Code Status: Full  Family Communication: None 16 2021 patient updated at bedside Disposition: Currently intensive care unit may be transferred to stepdown unit once off Precedex  Critical care time: 30 min     2022 Alexis [name] ACNP Acute Care Nurse Practitioner [facility] Pulmonary/Critical Care Please consult Amion 04/11/2020, 8:47 AM

## 2020-04-11 NOTE — Evaluation (Signed)
Occupational Therapy Evaluation Patient Details Name: Alexis [name] MRN: [id number] DOB: 2000/10/25 Today's Date: 04/11/2020    History of Present Illness Patient is a 19 y/o female who was found unresponsive at home due to an intentional overdose of gabapentin. + ETOH, + THC. Intubated 11/14-11/15.  It is possible that patient had myoclonus due to gabapentin toxicity. PMH includes depression, anxiety, migraines.   Clinical Impression   This 19 y/o female presents with the above. PTA pt living with roommates and independent with ADL, iADL and functional mobility. Pt currently presenting with the above and below listed deficits including impaired vision, cognition, balance and with ataxia at this time. Pt requiring minA-modA for balance EOB and sit<>Stand, but requiring two person assist for attempting side steps towards [facility] given instability and bil UE/truncal ataxia. Pt also reporting vertical diplopia (though not currently experiencing at time of eval). She requires up to maxA for ADL. Pt to benefit from continued acute OT services to maximize her safety and independence with ADL and mobility tasks; per RN pt may likely d/c to inpt psych facility. Will follow.     Follow Up Recommendations  Supervision/Assistance - 24 hour;Other (comment) (per RN likely to d/c to inpt psych)    Equipment Recommendations  Other (comment) (TBD)           Precautions / Restrictions Precautions Precautions: Fall Restrictions Weight Bearing Restrictions: No      Mobility Bed Mobility Overal bed mobility: Needs Assistance Bed Mobility: Supine to Sit     Supine to sit: Min assist;HOB elevated     General bed mobility comments: Min A to help with trunk to get to EOB, Mod A once EOB due to truncal/UEs ataxia. Improved wtih approximation    Transfers Overall transfer level: Needs assistance Equipment used: 1 person hand held assist Transfers: Sit to/from Stand Sit to Stand: Min assist          General transfer comment: Pt impulsively standing when asked to scoot bottom forward, Min A for balance due to ataxia and unsteadiness.    Balance Overall balance assessment: Needs assistance Sitting-balance support: Feet supported;Bilateral upper extremity supported Sitting balance-Leahy Scale: Poor Sitting balance - Comments: Requiring mod A for support due to ataxia, requires approximation through trunk for pt to be able to use UEs to donn socks reaching outside BoS. Posterior pelvic tilt and flexed posturing.   Standing balance support: Single extremity supported;Bilateral upper extremity supported;During functional activity Standing balance-Leahy Scale: Poor Standing balance comment: Requires external support for standing balance. Mod A of 2 for dynamic tasks.                           ADL either performed or assessed with clinical judgement   ADL Overall ADL's : Needs assistance/impaired Eating/Feeding: Minimal assistance;Sitting   Grooming: Moderate assistance;Sitting Grooming Details (indicate cue type and reason): given ataxia Upper Body Bathing: Moderate assistance;Sitting   Lower Body Bathing: +2 for physical assistance;Sit to/from stand;Moderate assistance   Upper Body Dressing : Moderate assistance;Sitting   Lower Body Dressing: Maximal assistance;+2 for physical assistance;+2 for safety/equipment;Sit to/from stand Lower Body Dressing Details (indicate cue type and reason): minA for static standing     Toileting- Clothing Manipulation and Hygiene: Maximal assistance;+2 for physical assistance;+2 for safety/equipment;Sit to/from stand       Functional mobility during ADLs: Minimal assistance;Moderate assistance;+2 for physical assistance;+2 for safety/equipment (HHA, minA to stand; modA+2 for side steps given ataxia )  Vision Baseline Vision/History: Wears glasses Wears Glasses: At all times Patient Visual Report: Diplopia;Blurring of  vision Vision Assessment?: Yes;Vision impaired- to be further tested in functional context Diplopia Assessment: Objects split on top of one another Additional Comments: will need to further assess ; pt reports diplopia but wasn't experiencing at time of eval      Perception     Praxis      Pertinent Vitals/Pain Pain Assessment: No/denies pain     Hand Dominance Right   Extremity/Trunk Assessment Upper Extremity Assessment Upper Extremity Assessment: RUE deficits/detail;LUE deficits/detail RUE Deficits / Details: ataxia present bil UEs especially with intentional movements, when not WB RUE Coordination: decreased gross motor;decreased fine motor LUE Deficits / Details: ataxia present bil UEs especially with intentional movements, when not WB LUE Coordination: decreased fine motor;decreased gross motor   Lower Extremity Assessment Lower Extremity Assessment: Defer to PT evaluation   Cervical / Trunk Assessment Cervical / Trunk Assessment: Other exceptions Cervical / Trunk Exceptions: ataxia noted throughout trunk with movement   Communication Communication Communication: No difficulties   Cognition Arousal/Alertness: Awake/alert Behavior During Therapy: Impulsive Overall Cognitive Status: Impaired/Different from baseline Area of Impairment: Problem solving;Following commands                       Following Commands: Follows one step commands consistently (with repetition)     Problem Solving: Slow processing;Requires verbal cues;Requires tactile cues General Comments: Father reports slower processing than normal. Memory appears intact. Following commands with some repetition needed. Impulsive at times.   General Comments  VSS, father and safety sitter present during session    Exercises     Shoulder Instructions      Home Living Family/patient expects to be discharged to:: Private residence Living Arrangements: Non-relatives/Friends;Other (Comment)  (roommates) Available Help at Discharge: Friend(s);Available PRN/intermittently;Family Type of Home: House Home Access: Stairs to enter [employer] of Steps: "a few" on the porch   Home Layout: One level     Bathroom Shower/Tub: Chief Strategy Officer: Standard     Home Equipment: None          Prior Functioning/Environment Level of Independence: Independent        Comments: Works as a Production assistant, 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 Was seeing OPPT for bladder retraining, back pain and LE strengthening.        OT Problem List: Decreased strength;Decreased range of motion;Decreased activity tolerance;Impaired balance (sitting and/or standing);Decreased coordination;Decreased cognition;Decreased safety awareness;Decreased knowledge of use of DME or AE;Impaired UE functional use;Impaired vision/perception      OT Treatment/Interventions: Self-care/ADL training;Therapeutic exercise;Energy conservation;DME and/or AE instruction;Therapeutic activities;Cognitive remediation/compensation;Patient/family education;Balance training;Visual/perceptual remediation/compensation    OT Goals(Current goals can be found in the care plan section) Acute Rehab OT Goals Patient Stated Goal: "to lay down" OT Goal Formulation: With patient Time For Goal Achievement: 04/25/20 Potential to Achieve Goals: Good  OT Frequency: Min 2X/week   Barriers to D/C:            Co-evaluation PT/OT/SLP Co-Evaluation/Treatment: Yes Reason for Co-Treatment: For patient/therapist safety;To address functional/ADL transfers PT goals addressed during session: Mobility/safety with mobility;Balance;Strengthening/ROM OT goals addressed during session: Strengthening/ROM      AM-PAC OT "6 Clicks" Daily Activity     Outcome Measure Help from another person eating meals?: A Lot Help from another person taking care of personal grooming?: A Lot Help from another person toileting, which includes using  toliet, bedpan, or urinal?: A Lot Help from another  person bathing (including washing, rinsing, drying)?: A Lot Help from another person to put on and taking off regular upper body clothing?: A Lot Help from another person to put on and taking off regular lower body clothing?: A Lot 6 Click Score: 12   End of Session Nurse Communication: Mobility status  Activity Tolerance: Patient tolerated treatment well Patient left: in bed;with call bell/phone within reach;with restraints reapplied;with bed alarm set;with nursing/sitter in room;with family/visitor present  OT Visit Diagnosis: Other abnormalities of gait and mobility (R26.89);Other symptoms and signs involving the nervous system (R29.898);Other symptoms and signs involving cognitive function                Time: 1358-1415 OT Time Calculation (min): 17 min Charges:  OT General Charges $OT Visit: 1 Visit OT Evaluation $OT Eval Moderate Complexity: 1 Mod  Marcy Siren, OT Acute Rehabilitation Services Pager [phone] Office [phone]   Orlando Penner 04/11/2020, 4:14 PM

## 2020-04-11 NOTE — Progress Notes (Signed)
[facility] ADULT ICU REPLACEMENT PROTOCOL   The patient does apply for the [facility] Adult ICU Electrolyte Replacment Protocol based on the criteria listed below:   1. Is GFR >/= 30 ml/min? Yes.    Patient's GFR today is >60 2. Is SCr </= 2? Yes.   Patient's SCr is 0.69 ml/kg/hr 3. Did SCr increase >/= 0.5 in 24 hours? No. 4. Abnormal electrolyte(s): k 3.2 5. Ordered repletion with: protocol 6. If a panic level lab has been reported, has the CCM MD in charge been notified? No..   Physician:    Markus Daft A 04/11/2020 5:40 AM

## 2020-04-11 NOTE — Consult Note (Signed)
[facility]-to[facility] Consult   Reason for Consult:  Suicide attempt Referring Physician:  Devra DoppSteve Minor, NP Patient Identification: Alexis GeneCasey M Meriweather MRN:  [id number] Principal Diagnosis: <principal problem not specified> Diagnosis:  Active Problems:   Overdose   Acute encephalopathy   Gabapentin overdose   Chronic respiratory failure (HCC)   Total Time spent with patient: 45 minutes  Subjective:   Alexis [name] is a 19 y.o. female patient admitted with intentional overdose to attempt suicide. Psych consult was placed for suicide attempt. She does admit to an overdose of Lamictal. Further clarification was sought and she denies taking any gabapentin, however her medical team feels she may have over dosed on gabapentin and Lamictal (2 empty bottles where found beside her). She denies any recent triggers, stressors, or indicators that preceded her attempt. She admits to impulsive behaviors and poor impulse control. SHe reports 4 other previous attempts that include hanging and overdose, one of which required medical intervention. She reports she was seen by psychiatry 2 months ago, who determine her to be stable and prescribed her 3 month prescription. She also reports seeing a therapist weekly, and unable to recall their name. She denies any depressive symptoms at this time, and appears preoccupied on going home. SHe declines wanting to come into the hospital "they dont do nothing. I have a therapist and I have a  Psychiatrist. They make me go to groups that dont help. " SHe denies current suicidal ideations, homicidal ideations and or hallucinations.   HPI:    Past Psychiatric History: Bipolar, MDD< GAD. She is currently being prescribed Lamictal and gabapentin by a psychiatrist. She is a poor historian and unable to recall the name of the psychiatrist or the therapist. Francis DowseSHe reports [facility] (same time 2 [facility]), similar presentation via overdose.  SHe reports 5 suicide attempts in her life time(hanging attempt boyfriend grabbed her and overdose -->vomiting), 2 in which required medical interventions.   Risk to Self:  Yes Risk to Others:  Yes [facility]:  [facility] Prior Outpatient Therapy:  Yes unable to recall.   Past Medical History:  Past Medical History:  Diagnosis Date  . Anxiety   . Asthma   . Depression   . GERD (gastroesophageal reflux disease)   . Migraines     Past Surgical History:  Procedure Laterality Date  . CYSTO WITH HYDRODISTENSION N/A 04/10/2018   Procedure: CYSTOSCOPY/HYDRODISTENSION;  Surgeon: Crist FatHerrick, Benjamin W, MD;  [facility] ;  Service: Urology;  Laterality: N/A;  . UPPER GI ENDOSCOPY     Family History:  Family History  Problem Relation Age of Onset  . Asthma Mother   . Thyroid disease Mother   . Healthy Sister    Family Psychiatric  History: Per patient and her father. Patient mother has history of Bipolar, Borderline, MDD, and anxiety. Per dad Mother has substance abuse issues and lives on the streets. BOth reports paternal grandmother completed suicide by overdose.  Social History:  Social History   Substance and Sexual Activity  Alcohol Use Never     Social History   Substance and Sexual Activity  Drug Use Never    Social History   Socioeconomic History  . Marital status: Single    Spouse name: Not on file  . Number of children: 0  . Years of education: 6314  . Highest education level: Some college, no degree  Occupational History  . Not on file  Tobacco Use  . Smoking status: Never Smoker  . Smokeless tobacco: Never Used  Vaping Use  . Vaping Use: Never used  Substance and Sexual Activity  . Alcohol use: Never  . Drug use: Never  . Sexual activity: Yes    Birth control/protection: Pill  Other Topics Concern  . Not on file  Social History Narrative  . Not on file   Social Determinants of Health   Financial Resource  Strain:   . Difficulty of Paying Living Expenses: Not on file  Food Insecurity:   . Worried About Programme researcher, broadcasting/film/video in the Last Year: Not on file  . Ran Out of Food in the Last Year: Not on file  Transportation Needs:   . Lack of Transportation (Medical): Not on file  . Lack of Transportation (Non-Medical): Not on file  Physical Activity:   . Days of Exercise per Week: Not on file  . Minutes of Exercise per Session: Not on file  Stress:   . Feeling of Stress : Not on file  Social Connections:   . Frequency of Communication with Friends and Family: Not on file  . Frequency of Social Gatherings with Friends and Family: Not on file  . Attends Religious Services: Not on file  . Active Member of Clubs or Organizations: Not on file  . Attends Banker Meetings: Not on file  . Marital Status: Not on file   Additional Social History:    Allergies:   Allergies  Allergen Reactions  . Other Other (See Comments)    All acidic foods cause extreme bladder pain    Labs:  Results for orders placed or performed during the hospital encounter of 04/09/20 (from the past 48 hour(s))  Comprehensive metabolic panel     Status: Abnormal   Collection Time: 04/09/20  6:16 PM  Result Value Ref Range   Sodium 141 135 - 145 mmol/L   Potassium 3.3 (L) 3.5 - 5.1 mmol/L   Chloride 104 98 - 111 mmol/L   CO2 22 22 - 32 mmol/L   Glucose, Bld 167 (H) 70 - 99 mg/dL    Comment: Glucose reference range applies only to samples taken after fasting for at least 8 hours.   BUN 10 6 - 20 mg/dL   Creatinine, Ser 3.50 0.44 - 1.00 mg/dL   Calcium 8.9 8.9 - 09.3 mg/dL   Total Protein 7.0 6.5 - 8.1 g/dL   Albumin 4.3 3.5 - 5.0 g/dL   AST 17 15 - 41 U/L   ALT 10 0 - 44 U/L   Alkaline Phosphatase 41 38 - 126 U/L   Total Bilirubin 0.6 0.3 - 1.2 mg/dL   GFR, Estimated >81 >82 mL/min    Comment: (NOTE) Calculated using the CKD-EPI Creatinine Equation (2021)    Anion gap 15 5 - 15    Comment:  Performed at [facility], [address], [place], [place] [address]  Salicylate level     Status: Abnormal   Collection Time: 04/09/20  6:16 PM  Result Value Ref Range   Salicylate Lvl <7.0 (L) 7.0 - 30.0 mg/dL    Comment: Performed at [facility], [address], [place], [place] [address]  Acetaminophen level     Status: Abnormal   Collection Time: 04/09/20  6:16 PM  Result Value Ref Range   Acetaminophen (Tylenol), Serum <10 (L) 10 - 30 ug/mL    Comment: (NOTE) Therapeutic concentrations vary significantly. A range of 10-30  ug/mL  may be an effective concentration for many patients. However, some  are best treated at concentrations outside of this range. Acetaminophen concentrations >150 ug/mL at 4 hours after ingestion  and >50 ug/mL at 12 hours after ingestion are often associated with  toxic reactions.  Performed at [facility], [address] [place], [place] [address]   Ethanol     Status: Abnormal   Collection Time: 04/09/20  6:16 PM  Result Value Ref Range   Alcohol, Ethyl (B) 35 (H) <10 mg/dL    Comment: (NOTE) Lowest detectable limit for serum alcohol is 10 mg/dL.  For medical purposes only. Performed at [facility], [address], [place], [place] [address]   CBC WITH DIFFERENTIAL     Status: None   Collection Time: 04/09/20  6:16 PM  Result Value Ref Range   WBC 7.8 4.0 - 10.5 K/uL   RBC 4.47 3.87 - 5.11 MIL/uL   Hemoglobin 13.5 12.0 - 15.0 g/dL   HCT 78.4 36 - 46 %   MCV 93.5 80.0 - 100.0 fL   MCH 30.2 26.0 - 34.0 pg   MCHC 32.3 30.0 - 36.0 g/dL   RDW 69.6 29.5 - 28.4 %   Platelets 221 150 - 400 K/uL   nRBC 0.0 0.0 - 0.2 %   Neutrophils Relative % 69 %   Neutro Abs 5.4 1.7 - 7.7 K/uL   Lymphocytes Relative 24 %   Lymphs Abs 1.8 0.7 - 4.0 K/uL   Monocytes Relative 5 %   Monocytes Absolute 0.4 0.1 - 1.0 K/uL   Eosinophils Relative 1 %   Eosinophils Absolute 0.1 0.0 - 0.5  K/uL   Basophils Relative 0 %   Basophils Absolute 0.0 0.0 - 0.1 K/uL   Immature Granulocytes 1 %   Abs Immature Granulocytes 0.04 0.00 - 0.07 K/uL    Comment: Performed at [facility], [address] [place], [place] [address]  Respiratory Panel by RT PCR (Flu A&B, Covid) - Nasopharyngeal Swab     Status: None   Collection Time: 04/09/20  6:46 PM   Specimen: Nasopharyngeal Swab  Result Value Ref Range   SARS Coronavirus 2 by RT PCR NEGATIVE NEGATIVE    Comment: (NOTE) SARS-CoV-2 target nucleic acids are NOT DETECTED.  The SARS-CoV-2 RNA is generally detectable in upper respiratoy specimens during the acute phase of infection. The lowest concentration of SARS-CoV-2 viral copies this assay can detect is 131 copies/mL. A negative result does not preclude SARS-Cov-2 infection and should not be used as the sole basis for treatment or other patient management decisions. A negative result may occur with  improper specimen collection/handling, submission of specimen other than nasopharyngeal swab, presence of viral mutation(s) within the areas targeted by this assay, and inadequate number of viral copies (<131 copies/mL). A negative result must be combined with clinical observations, patient history, and epidemiological information. The expected result is Negative.  Fact Sheet for Patients:  https://www.moore.com/  Fact Sheet for Healthcare Providers:  https://www.young.biz/  This test is no t yet approved or cleared by the [place] FDA and  has been authorized for detection and/or diagnosis of SARS-CoV-2 by FDA under an Emergency Use Authorization (EUA). This EUA will remain  in effect (meaning this test can be used) for the duration of the COVID-19 declaration under Section 564(b)(1) of the Act, 21 U.S.C. section 360bbb-3(b)(1), unless the authorization is terminated or revoked sooner.     Influenza A by  PCR NEGATIVE  NEGATIVE   Influenza B by PCR NEGATIVE NEGATIVE    Comment: (NOTE) The Xpert Xpress SARS-CoV-2/FLU/RSV assay is intended as an aid in  the diagnosis of influenza from Nasopharyngeal swab specimens and  should not be used as a sole basis for treatment. Nasal washings and  aspirates are unacceptable for Xpert Xpress SARS-CoV-2/FLU/RSV  testing.  Fact Sheet for Patients: https://www.moore.com/  Fact Sheet for Healthcare Providers: https://www.young.biz/  This test is not yet approved or cleared by the [place] FDA and  has been authorized for detection and/or diagnosis of SARS-CoV-2 by  FDA under an Emergency Use Authorization (EUA). This EUA will remain  in effect (meaning this test can be used) for the duration of the  Covid-19 declaration under Section 564(b)(1) of the Act, 21  U.S.C. section 360bbb-3(b)(1), unless the authorization is  terminated or revoked. Performed at [facility], [address], [place], [place] [address]   CBG monitoring, ED     Status: Abnormal   Collection Time: 04/09/20  6:54 PM  Result Value Ref Range   Glucose-Capillary 139 (H) 70 - 99 mg/dL    Comment: Glucose reference range applies only to samples taken after fasting for at least 8 hours.  Blood gas, arterial     Status: Abnormal   Collection Time: 04/09/20  7:35 PM  Result Value Ref Range   FIO2 100.00    Delivery systems VENTILATOR    Mode PRESSURE REGULATED VOLUME CONTROL    VT 470 mL   Peep/cpap 5.0 cm H20   pH, Arterial 7.503 (H) 7.35 - 7.45   pCO2 arterial 27.4 (L) 32 - 48 mmHg   pO2, Arterial 533 (H) 83 - 108 mmHg   Bicarbonate 21.4 20.0 - 28.0 mmol/L   Acid-base deficit 0.5 0.0 - 2.0 mmol/L   O2 Saturation 100.0 %   Patient temperature 97.6     Comment: Performed at [facility], [address] [place], [place] [address]  hCG, serum, qualitative     Status: None   Collection Time: 04/09/20  8:41 PM   Result Value Ref Range   Preg, Serum NEGATIVE NEGATIVE    Comment: Performed at [facility], [address] [place], [place] [address]  Urine rapid drug screen (hosp performed)     Status: Abnormal   Collection Time: 04/09/20  9:36 PM  Result Value Ref Range   Opiates NONE DETECTED NONE DETECTED   Cocaine NONE DETECTED NONE DETECTED   Benzodiazepines NONE DETECTED NONE DETECTED   Amphetamines NONE DETECTED NONE DETECTED   Tetrahydrocannabinol POSITIVE (A) NONE DETECTED   Barbiturates NONE DETECTED NONE DETECTED    Comment: (NOTE) DRUG SCREEN FOR MEDICAL PURPOSES ONLY.  IF CONFIRMATION IS NEEDED FOR ANY PURPOSE, NOTIFY LAB WITHIN 5 DAYS.  LOWEST DETECTABLE LIMITS FOR URINE DRUG SCREEN Drug Class                     Cutoff (ng/mL) Amphetamine and metabolites    1000 Barbiturate and metabolites    200 Benzodiazepine                 200 Tricyclics and metabolites     300 Opiates and metabolites        300 Cocaine and metabolites        300 THC  50 Performed at [facility], [address], [place], [place] [address]   Magnesium     Status: None   Collection Time: 04/09/20  9:36 PM  Result Value Ref Range   Magnesium 1.7 1.7 - 2.4 mg/dL    Comment: Performed at [facility], [address] [place], [place] [address]  Protime-INR     Status: None   Collection Time: 04/09/20  9:36 PM  Result Value Ref Range   Prothrombin Time 13.0 11.4 - 15.2 seconds   INR 1.0 0.8 - 1.2    Comment: (NOTE) INR goal varies based on device and disease states. Performed at [facility], [address], [place], [place] [address]   CK     Status: None   Collection Time: 04/09/20  9:36 PM  Result Value Ref Range   Total CK 45 38.0 - 234.0 U/L    Comment: Performed at [facility], [address], [place], [place] [address]  Prolactin     Status: Abnormal   Collection  Time: 04/09/20  9:36 PM  Result Value Ref Range   Prolactin 33.8 (H) 4.8 - 23.3 ng/mL    Comment: (NOTE) Performed At: [facility] [address] [place], [place] [id number] Jolene Schimke MD MV:[id number]   Urinalysis, Routine w reflex microscopic Urine, Catheterized     Status: Abnormal   Collection Time: 04/09/20  9:36 PM  Result Value Ref Range   Color, Urine YELLOW YELLOW   APPearance CLEAR CLEAR   Specific Gravity, Urine 1.013 1.005 - 1.030   pH 5.0 5.0 - 8.0   Glucose, UA 50 (A) NEGATIVE mg/dL   Hgb urine dipstick NEGATIVE NEGATIVE   Bilirubin Urine NEGATIVE NEGATIVE   Ketones, ur 20 (A) NEGATIVE mg/dL   Protein, ur NEGATIVE NEGATIVE mg/dL   Nitrite NEGATIVE NEGATIVE   Leukocytes,Ua NEGATIVE NEGATIVE    Comment: Performed at [facility], [address], [place], [place] [address]  Comprehensive metabolic panel     Status: Abnormal   Collection Time: 04/09/20  9:36 PM  Result Value Ref Range   Sodium 139 135 - 145 mmol/L   Potassium 3.3 (L) 3.5 - 5.1 mmol/L   Chloride 106 98 - 111 mmol/L   CO2 21 (L) 22 - 32 mmol/L   Glucose, Bld 149 (H) 70 - 99 mg/dL    Comment: Glucose reference range applies only to samples taken after fasting for at least 8 hours.   BUN 9 6 - 20 mg/dL   Creatinine, Ser 4.40 0.44 - 1.00 mg/dL   Calcium 8.4 (L) 8.9 - 10.3 mg/dL   Total Protein 6.3 (L) 6.5 - 8.1 g/dL   Albumin 3.8 3.5 - 5.0 g/dL   AST 16 15 - 41 U/L   ALT 10 0 - 44 U/L   Alkaline Phosphatase 38 38 - 126 U/L   Total Bilirubin 0.8 0.3 - 1.2 mg/dL   GFR, Estimated >10 >27 mL/min    Comment: (NOTE) Calculated using the CKD-EPI Creatinine Equation (2021)    Anion gap 12 5 - 15    Comment: Performed at [facility], [address], [place], [place] [address]  HIV Antibody (routine testing w rflx)     Status: None   Collection Time: 04/09/20  9:36 PM  Result Value Ref Range   HIV Screen 4th Generation wRfx Non Reactive Non Reactive     Comment: Performed at [facility], [address], [place], [place] [address]  MRSA PCR Screening     Status: None   Collection Time: 04/10/20  2:29 AM   Specimen: Nasal Mucosa; Nasopharyngeal  Result Value Ref Range   MRSA by PCR NEGATIVE NEGATIVE    Comment:        The GeneXpert MRSA Assay (FDA approved for NASAL specimens only), is one component of a comprehensive MRSA colonization surveillance program. It is not intended to diagnose MRSA infection nor to guide or monitor treatment for MRSA infections. Performed at [facility], [address], [place], [place] [address]   I-STAT 7, (LYTES, BLD GAS, ICA, H+H)     Status: Abnormal   Collection Time: 04/10/20  4:42 AM  Result Value Ref Range   pH, Arterial 7.543 (H) 7.35 - 7.45   pCO2 arterial 26.8 (L) 32 - 48 mmHg   pO2, Arterial 192 (H) 83 - 108 mmHg   Bicarbonate 23.0 20.0 - 28.0 mmol/L   TCO2 24 22 - 32 mmol/L   O2 Saturation 100.0 %   Acid-Base Excess 1.0 0.0 - 2.0 mmol/L   Sodium 138 135 - 145 mmol/L   Potassium 3.3 (L) 3.5 - 5.1 mmol/L   Calcium, Ion 1.18 1.15 - 1.40 mmol/L   HCT 30.0 (L) 36 - 46 %   Hemoglobin 10.2 (L) 12.0 - 15.0 g/dL   Patient temperature 51.0 F    Collection site Radial    Drawn by RT    Sample type ARTERIAL   Comprehensive metabolic panel     Status: Abnormal   Collection Time: 04/11/20  3:54 AM  Result Value Ref Range   Sodium 139 135 - 145 mmol/L   Potassium 3.2 (L) 3.5 - 5.1 mmol/L   Chloride 104 98 - 111 mmol/L   CO2 25 22 - 32 mmol/L   Glucose, Bld 118 (H) 70 - 99 mg/dL    Comment: Glucose reference range applies only to samples taken after fasting for at least 8 hours.   BUN <5 (L) 6 - 20 mg/dL   Creatinine, Ser 2.58 0.44 - 1.00 mg/dL   Calcium 8.6 (L) 8.9 - 10.3 mg/dL   Total Protein 5.2 (L) 6.5 - 8.1 g/dL   Albumin 3.1 (L) 3.5 - 5.0 g/dL   AST 17 15 - 41 U/L   ALT 9 0 - 44 U/L   Alkaline Phosphatase 41 38 - 126 U/L   Total Bilirubin 0.4 0.3 - 1.2 mg/dL   GFR,  Estimated >52 >77 mL/min    Comment: (NOTE) Calculated using the CKD-EPI Creatinine Equation (2021)    Anion gap 10 5 - 15    Comment: Performed at [facility], [address] [place], [place] [address]    Current Facility-Administered Medications  Medication Dose Route Frequency Provider Last Rate Last Admin  . acetaminophen (TYLENOL) tablet 650 mg  650 mg Oral Q4H PRN Charlotte Sanes, MD      . albuterol (PROVENTIL) (2.5 MG/3ML) 0.083% nebulizer solution 2.5 mg  2.5 mg Nebulization Q4H PRN Charlotte Sanes, MD      . budesonide (PULMICORT) nebulizer solution 0.25 mg  0.25 mg Nebulization BID Charlotte Sanes, MD   0.25 mg at 04/11/20 0754  . Chlorhexidine Gluconate Cloth 2 % PADS 6 each  6 each Topical Daily Karl Ito, MD      . dexmedetomidine (PRECEDEX) 400 MCG/100ML (4 mcg/mL) infusion  0.2-0.7 mcg/kg/hr Intravenous Continuous Simonne Martinet, NP   Stopped at 04/11/20  0801  . dextrose 5 %-0.45 % sodium chloride infusion   Intravenous Continuous Charlotte Sanes, MD   Stopped at 04/11/20 1156  . diazepam (VALIUM) tablet 5 mg  5 mg Oral Q8H Agarwala, Ravi, MD      . docusate sodium (COLACE) capsule 100 mg  100 mg Oral BID PRN Charlotte Sanes, MD      . famotidine (PEPCID) tablet 20 mg  20 mg Oral BID Lynnell Catalan, MD   20 mg at 04/11/20 0953  . heparin injection 5,000 Units  5,000 Units Subcutaneous Q8H Charlotte Sanes, MD   5,000 Units at 04/11/20 0602  . LORazepam (ATIVAN) injection 2 mg  2 mg Intravenous PRN Charlsie Quest, MD      . magnesium sulfate IVPB 2 g 50 mL  2 g Intravenous Once Minor, Vilinda Blanks, NP 50 mL/hr at 04/11/20 1200 Rate Verify at 04/11/20 1200  . polyethylene glycol (MIRALAX / GLYCOLAX) packet 17 g  17 g Oral Daily PRN Charlotte Sanes, MD      . potassium chloride SA (KLOR-CON) CR tablet 20 mEq  20 mEq Oral Q4H Karl Ito, MD   20 mEq at 04/11/20 6010    Musculoskeletal: Strength & Muscle Tone: within normal limits Gait & Station:  normal Patient leans: N/A  Psychiatric Specialty Exam: Physical Exam  Review of Systems  Blood pressure (!) 129/95, pulse (!) 119, temperature 99.1 F (37.3 C), temperature source Oral, resp. rate (!) 25, height 5\' 6"  (1.676 m), weight 49.4 kg, SpO2 98 %.Body mass index is 17.58 kg/m.  General Appearance: Disheveled  Eye Contact:  fair, squinting noted.   Speech:  Clear and Coherent and Slow  Volume:  Normal  Mood:  fine  Affect:  Constricted  Thought Process:  Linear and Descriptions of Associations: Tangential  Orientation:  Full (Time, Place, and Person)  Thought Content:  Logical and Tangential  Suicidal Thoughts:  She denies at this time, although current admission resulted in an attempt.  Homicidal Thoughts:  No  Memory:  Immediate;   Fair Recent;   Poor Remote;   Poor  Judgement:  Poor  Insight:  Lacking  Psychomotor Activity:  Normal  Concentration:  Concentration: Poor and Attention Span: Fair  Recall:  Poor  Fund of Knowledge:  Fair  Language:  Fair  Akathisia:  No  Handed:  Right  AIMS (if indicated):     Assets:  Communication Skills Desire for Improvement Financial Resources/Insurance Housing Leisure Time Physical Health Social Support  ADL's:  Intact  Cognition:  WNL  Sleep:      MAREA REASNER is a 19 year old female with a past psychiatric history significant for major depression, Bipolar, and generalized anxiety. She denies any diagnosis of borderline personality although she feels her mother have this. She has poor insight and impulse control, admits to a significant suicide attempt by overdose on lamictal with no triggers. SHe has history of suicide attempt that resulted in one previous psychiatric admission. Consent obtained with father who was very tearful, and very concerned about is daughters behaviors. " Im afraid she is going to complete suicide. SHe has always been a difficult child. Usually she gives 36 signs but this time there was nothing, if  we didn't go check on her she would be dead. My mother completed at the age of 40. Im so concerned for her and I dont want her to admitted involuntary." Patient denies suicidal ideations at this time. Despite her receiving current outpatient  history, patient still attempted which place her at high risk for another repeat suicide attempt.   Treatment Plan Summary: Plan WIll recommend inpatient once she is medically stable. Patient will need to be placed under IVC. Will continue with safety sitterm until she is admitted to psychiatric hospital. Recommend working closely with CSW to facilitate inpatient psychiatric admission once she is medically stable.  -will start medications once she is admitted to the psychiatric hospital.  -Patient may benefit from DBT referral as well due to emotional dysregulation, poor impulse control, multiple suicide attempts with no triggers, and family history.  Disposition: Recommend psychiatric Inpatient admission when medically cleared. Patient will need to be placed under IVC, she has poor insight and judgement into her significatnt almost lethal suicide attempt 2 days ago.   Maryagnes Amos, FNP 04/11/2020 12:34 PM

## 2020-04-11 NOTE — Progress Notes (Signed)
IVC paperwork completed, signed by MD, and faxed to magistrate.  Waiting on custody order.  Unable to reach magistrate by phone to confirm. Daleen Squibb, MSW, LCSW 11/16/20212:44 PM

## 2020-04-11 NOTE — Progress Notes (Signed)
vLTM EEG complete. No skin breakdown 

## 2020-04-11 NOTE — Progress Notes (Addendum)
Subjective: Patient awake, alert.  States that she does not want to go to psych unit.  Requesting eyeglasses but she cannot see clearly and reporting double vision.  ROS: negative except above  Examination  Vital signs in last 24 hours: Temp:  [98.4 F (36.9 C)-99.4 F (37.4 C)] 99.1 F (37.3 C) (11/16 1200) Pulse Rate:  [88-165] 120 (11/16 1300) Resp:  [19-36] 21 (11/16 1300) BP: (88-142)/(60-100) 110/78 (11/16 1300) SpO2:  [95 %-100 %] 97 % (11/16 1300) FiO2 (%):  [21 %] 21 % (11/15 2200)  General: lying in bed, not in apparent distress CVS: pulse-normal rate and rhythm RS: breathing comfortably, CTA B Extremities: normal, warm  Neuro: MS: Alert, oriented, follows commands CN: pupils equal and reactive,  EOMI, face symmetric, tongue midline, normal sensation over face, Motor: 5/5 strength in all 4 extremities Reflexes: 2+ bilaterally over patella, biceps, plantars: flexor Coordination: normal Gait: not tested  Basic Metabolic Panel: Recent Labs  Lab 04/09/20 1816 04/09/20 September 21, 2134 04/10/20 0442 04/11/20 0354  NA 141 139 138 139  K 3.3* 3.3* 3.3* 3.2*  CL 104 106  --  104  CO2 22 21*  --  25  GLUCOSE 167* 149*  --  118*  BUN 10 9  --  <5*  CREATININE 0.49 0.57  --  0.69  CALCIUM 8.9 8.4*  --  8.6*  MG  --  1.7  --   --     CBC: Recent Labs  Lab 04/09/20 1816 04/10/20 0442  WBC 7.8  --   NEUTROABS 5.4  --   HGB 13.5 10.2*  HCT 41.8 30.0*  MCV 93.5  --   PLT 221  --      Coagulation Studies: Recent Labs    04/09/20 21-Sep-2134  LABPROT 13.0  INR 1.0    Imaging No new brain imaging overnight  ASSESSMENT AND PLAN: 19 year old female with past medical history of anxiety, migraines who presented with intentional overdose on gabapentin and possibly lamotrigine.  Ethanol level on arrival was 35.  Urine drug screen was positive for THC.   Suspected intentional overdose Binocular diplopia Alcohol use disorder with alcohol toxicity Cannabis use  disorder -EEG did not show any further seizures, consistent with encephalopathy.  It is possible that patient had myoclonus due to gabapentin toxicity.  Diplopia might be secondary to medication overdose   Recommendations -We will discontinue LTM EEG as no seizures noted. -Patient is currently on Valium 5 mg every 8 hours.  Okay to start weaning off over the next 48 to 72 hours. - Gabapentin does have a short half-life of avg 6 hours so I anticipate that it will be cleared in about 48 hours will defer to psychiatry if this can be resumed or not -We will consider ordering MRI brain patient continues to have persistent diplopia -Continue seizure precautions -As needed IV Ativan 2 mg for clinical seizure-like activity -Management of rest of the comorbidities per primary team -Discussed plan in detail with patient and her father at bedside  Thank you for allowing [place] to participate in the care of this patient.  We will follow peripherally.  Please call for questions.   I have spent a total of   35 minutes with the patient reviewing hospital notes,  test results, labs and examining the patient as well as establishing an assessment and plan that was discussed personally with the patient.  > 50% of time was spent in direct patient care.        Alexis [name]  [facility] [name] [facility] For questions after 5pm please refer to AMION to reach the Neurologist on call

## 2020-04-11 NOTE — Procedures (Signed)
Patient Name:Alexis [name] HFG:[id number] Epilepsy Attending:Pinkey Mcjunkin Annabelle Harman Referring Physician/Provider:Dr. Erick Blinks Duration: 04/11/2020 0802 to 04/11/2020 1116  Patient history:19 year old female who presented with intentional overdose of gabapentin and potentially lamotrigine on exam was noted to be altered with hyperreflexia. EEG to evaluate for seizures.  Level of alertness: Awake, Asleep  AEDs during EEG study:Valium  Technical aspects: This EEG study was done with scalp electrodes positioned according to the 10-20 International system of electrode placement. Electrical activity was acquired at a sampling rate of 500Hz  and reviewed with a high frequency filter of 70Hz  and a low frequency filter of 1Hz . EEG data were recorded continuously and digitally stored.   Description: During awake state, no posterior dominant rhythm was seen.  Sleep was characterized by vertex waves, sleep spindles (12 to 14 Hz), maximal frontocentral region.  EEG showed continuous generalized 3 to 5 Hz theta-delta slowing admixed with 15 to 18 Hz generalized beta activity.Hyperventilation and photic stimulation were not performed.   ABNORMALITY -Continuous slow, generalized  IMPRESSION: This study issuggestive of moderate diffuse encephalopathy, nonspecific etiology but most likely secondary to sedation.No seizures or epileptiform discharges were seen throughout the recording.  [name] 

## 2020-04-12 ENCOUNTER — Inpatient Hospital Stay (HOSPITAL_COMMUNITY): Payer: Medicaid Other

## 2020-04-12 ENCOUNTER — Inpatient Hospital Stay (HOSPITAL_COMMUNITY)
Admission: AD | Admit: 2020-04-12 | Discharge: 2020-04-16 | DRG: 885 | Disposition: A | Discharge status: Home / Self Care | Payer: Medicaid Other | Source: Intra-hospital | Attending: Psychiatry | Admitting: Psychiatry

## 2020-04-12 ENCOUNTER — Other Ambulatory Visit: Payer: Self-pay | Admitting: Psychiatric/Mental Health

## 2020-04-12 ENCOUNTER — Encounter (HOSPITAL_COMMUNITY): Payer: Self-pay | Admitting: Family

## 2020-04-12 DIAGNOSIS — Z9151 Personal history of suicidal behavior: Secondary | ICD-10-CM | POA: Diagnosis not present

## 2020-04-12 DIAGNOSIS — F332 Major depressive disorder, recurrent severe without psychotic features: Secondary | ICD-10-CM | POA: Diagnosis not present

## 2020-04-12 DIAGNOSIS — K219 Gastro-esophageal reflux disease without esophagitis: Secondary | ICD-10-CM | POA: Diagnosis present

## 2020-04-12 DIAGNOSIS — Z793 Long term (current) use of hormonal contraceptives: Secondary | ICD-10-CM | POA: Diagnosis not present

## 2020-04-12 DIAGNOSIS — T50902A Poisoning by unspecified drugs, medicaments and biological substances, intentional self-harm, initial encounter: Secondary | ICD-10-CM | POA: Diagnosis not present

## 2020-04-12 DIAGNOSIS — R4587 Impulsiveness: Secondary | ICD-10-CM | POA: Diagnosis present

## 2020-04-12 DIAGNOSIS — Z818 Family history of other mental and behavioral disorders: Secondary | ICD-10-CM

## 2020-04-12 DIAGNOSIS — F4010 Social phobia, unspecified: Secondary | ICD-10-CM | POA: Diagnosis present

## 2020-04-12 DIAGNOSIS — Z91018 Allergy to other foods: Secondary | ICD-10-CM | POA: Diagnosis not present

## 2020-04-12 DIAGNOSIS — F4310 Post-traumatic stress disorder, unspecified: Secondary | ICD-10-CM | POA: Diagnosis present

## 2020-04-12 DIAGNOSIS — N3010 Interstitial cystitis (chronic) without hematuria: Secondary | ICD-10-CM | POA: Diagnosis present

## 2020-04-12 DIAGNOSIS — F314 Bipolar disorder, current episode depressed, severe, without psychotic features: Secondary | ICD-10-CM | POA: Diagnosis present

## 2020-04-12 DIAGNOSIS — F319 Bipolar disorder, unspecified: Secondary | ICD-10-CM | POA: Diagnosis present

## 2020-04-12 DIAGNOSIS — Z79899 Other long term (current) drug therapy: Secondary | ICD-10-CM

## 2020-04-12 DIAGNOSIS — K5900 Constipation, unspecified: Secondary | ICD-10-CM | POA: Diagnosis present

## 2020-04-12 DIAGNOSIS — Z7951 Long term (current) use of inhaled steroids: Secondary | ICD-10-CM

## 2020-04-12 HISTORY — DX: Bipolar disorder, unspecified: F31.9

## 2020-04-12 LAB — BASIC METABOLIC PANEL
Anion gap: 9 (ref 5–15)
BUN: <5 mg/dL — ABNORMAL LOW (ref 6–20)
CO2: 26 mmol/L (ref 22–32)
Calcium: 9 mg/dL (ref 8.9–10.3)
Chloride: 104 mmol/L (ref 98–111)
Creatinine, Ser: 0.6 mg/dL (ref 0.44–1.00)
GFR, Estimated: >60 mL/min (ref >60)
Glucose, Bld: 96 mg/dL (ref 70–99)
Potassium: 3.1 mmol/L — ABNORMAL LOW (ref 3.5–5.1)
Sodium: 139 mmol/L (ref 135–145)

## 2020-04-12 LAB — MAGNESIUM: Magnesium: 2.1 mg/dL (ref 1.7–2.4)

## 2020-04-12 LAB — PHOSPHORUS: Phosphorus: 3 mg/dL (ref 2.5–4.6)

## 2020-04-12 MED ORDER — HYDROXYZINE HCL 25 MG PO TABS
25.0000 mg | ORAL_TABLET | Freq: Three times a day (TID) | ORAL | Status: DC | PRN
  Administered 2020-04-15: 25 mg via ORAL
  Filled 2020-04-12: qty 1

## 2020-04-12 MED ORDER — ACETAMINOPHEN 325 MG PO TABS
650.0000 mg | ORAL_TABLET | Freq: Four times a day (QID) | ORAL | Status: DC | PRN
  Administered 2020-04-13 – 2020-04-14 (×2): 650 mg via ORAL
  Filled 2020-04-12 (×2): qty 2

## 2020-04-12 MED ORDER — ONDANSETRON 4 MG PO TBDP: 4.0000 mg | ORAL_TABLET | Freq: Four times a day (QID) | ORAL | Status: AC | PRN

## 2020-04-12 MED ORDER — GABAPENTIN 100 MG PO CAPS: 100.0000 mg | ORAL_CAPSULE | ORAL | Status: DC | PRN

## 2020-04-12 MED ORDER — HYDROXYZINE HCL 25 MG PO TABS: 25.0000 mg | ORAL_TABLET | Freq: Four times a day (QID) | ORAL | Status: DC | PRN

## 2020-04-12 MED ORDER — INFLUENZA VAC SPLIT QUAD 0.5 ML IM SUSY: 0.5000 mL | PREFILLED_SYRINGE | INTRAMUSCULAR | Status: DC

## 2020-04-12 MED ORDER — NORGESTIM-ETH ESTRAD TRIPHASIC 0.18/0.215/0.25 MG-25 MCG PO TABS
1.0000 | ORAL_TABLET | Freq: Every day | ORAL | Status: DC
  Filled 2020-04-12: qty 1

## 2020-04-12 MED ORDER — NON FORMULARY: 110.0000 ug | Freq: Two times a day (BID) | Status: DC

## 2020-04-12 MED ORDER — ALBUTEROL SULFATE HFA 108 (90 BASE) MCG/ACT IN AERS: 1.0000 | INHALATION_SPRAY | Freq: Four times a day (QID) | RESPIRATORY_TRACT | Status: DC | PRN

## 2020-04-12 MED ORDER — HYDROXYZINE HCL 25 MG PO TABS: 25.0000 mg | ORAL_TABLET | Freq: Three times a day (TID) | ORAL | Status: DC | PRN

## 2020-04-12 MED ORDER — ENSURE ENLIVE PO LIQD
237.0000 mL | Freq: Two times a day (BID) | ORAL | Status: DC
  Administered 2020-04-13 – 2020-04-16 (×6): 237 mL via ORAL
  Filled 2020-04-12 (×11): qty 237

## 2020-04-12 MED ORDER — INFLUENZA VAC SPLIT QUAD 0.5 ML IM SUSY
0.5000 mL | PREFILLED_SYRINGE | Freq: Once | INTRAMUSCULAR | Status: AC
  Administered 2020-04-12: 0.5 mL via INTRAMUSCULAR
  Filled 2020-04-12: qty 0.5

## 2020-04-12 MED ORDER — LAMOTRIGINE 100 MG PO TABS
100.0000 mg | ORAL_TABLET | Freq: Every day | ORAL | Status: DC
  Administered 2020-04-13 – 2020-04-16 (×4): 100 mg via ORAL
  Filled 2020-04-12 (×6): qty 1

## 2020-04-12 MED ORDER — TRAZODONE HCL 50 MG PO TABS: 50.0000 mg | ORAL_TABLET | Freq: Every evening | ORAL | Status: DC | PRN

## 2020-04-12 MED ORDER — LOPERAMIDE HCL 2 MG PO CAPS: 2.0000 mg | ORAL_CAPSULE | ORAL | Status: AC | PRN

## 2020-04-12 MED ORDER — DIAZEPAM 5 MG PO TABS
5.0000 mg | ORAL_TABLET | Freq: Three times a day (TID) | ORAL | Status: DC
  Administered 2020-04-13 – 2020-04-16 (×9): 5 mg via ORAL
  Filled 2020-04-12 (×9): qty 1

## 2020-04-12 MED ORDER — POTASSIUM CHLORIDE CRYS ER 20 MEQ PO TBCR
20.0000 meq | EXTENDED_RELEASE_TABLET | ORAL | Status: AC
  Administered 2020-04-12 (×2): 20 meq via ORAL
  Filled 2020-04-12 (×2): qty 1

## 2020-04-12 MED ORDER — LORAZEPAM 1 MG PO TABS: 1.0000 mg | ORAL_TABLET | Freq: Four times a day (QID) | ORAL | Status: AC | PRN

## 2020-04-12 MED ORDER — FAMOTIDINE 20 MG PO TABS
20.0000 mg | ORAL_TABLET | Freq: Two times a day (BID) | ORAL | Status: DC
  Administered 2020-04-13 – 2020-04-16 (×6): 20 mg via ORAL
  Filled 2020-04-12 (×11): qty 1

## 2020-04-12 MED ORDER — FLUTICASONE PROPIONATE HFA 110 MCG/ACT IN AERO
1.0000 | INHALATION_SPRAY | Freq: Two times a day (BID) | RESPIRATORY_TRACT | Status: DC
  Administered 2020-04-13 – 2020-04-16 (×7): 1 via RESPIRATORY_TRACT
  Filled 2020-04-12: qty 12

## 2020-04-12 MED ORDER — MAGNESIUM HYDROXIDE 400 MG/5ML PO SUSP: 30.0000 mL | Freq: Every day | ORAL | Status: DC | PRN

## 2020-04-12 MED ORDER — SERTRALINE HCL 25 MG PO TABS
25.0000 mg | ORAL_TABLET | Freq: Every day | ORAL | Status: DC
  Filled 2020-04-12 (×2): qty 1

## 2020-04-12 MED ORDER — THIAMINE HCL 100 MG PO TABS
100.0000 mg | ORAL_TABLET | Freq: Every day | ORAL | Status: DC
  Administered 2020-04-13 – 2020-04-16 (×4): 100 mg via ORAL
  Filled 2020-04-12 (×7): qty 1

## 2020-04-12 MED ORDER — ALUM & MAG HYDROXIDE-SIMETH 200-200-20 MG/5ML PO SUSP: 30.0000 mL | ORAL | Status: DC | PRN

## 2020-04-12 MED ORDER — ADULT MULTIVITAMIN W/MINERALS CH
1.0000 | ORAL_TABLET | Freq: Every day | ORAL | Status: DC
  Administered 2020-04-13 – 2020-04-16 (×4): 1 via ORAL
  Filled 2020-04-12 (×6): qty 1

## 2020-04-12 MED ORDER — POTASSIUM CHLORIDE 10 MEQ/100ML IV SOLN
10.0000 meq | INTRAVENOUS | Status: AC
  Administered 2020-04-12 (×4): 10 meq via INTRAVENOUS
  Filled 2020-04-12 (×3): qty 100

## 2020-04-12 NOTE — Progress Notes (Signed)
Report given to [facility].

## 2020-04-12 NOTE — Discharge Summary (Signed)
Physician Discharge Summary  Patient ID: Alexis [name] MRN: [id number] DOB/AGE: [birth year] 19 y.o.  Admit date: 04/09/2020 Discharge date: 04/12/2020  Admission Diagnoses: Acute encephalopathy due to polysubstance overdose.   Discharge Diagnoses:  Active Problems:   Overdose   Acute encephalopathy   Gabapentin overdose   Chronic respiratory failure [facility])   Discharged Condition: good  Hospital Course: 19 year old woman who presented initially combative but then became increasingly somnolent following apparent intentional overdose. Intubated for airway protection on 11/14. Eventually extubated 11/15. Remained agitated. IVC completed 11/16. Evaluated by psychiatry who accepted her for inpatient admission.   Consults: psychiatry  Significant Diagnostic Studies: negative CT head and EEG with no evidence of seizures.   Treatments: mechanical ventilation and sedation  Discharge Exam: Blood pressure 111/74, pulse 94, temperature 99.5 F (37.5 C), temperature source Oral, resp. rate 20, height 5\' 6"  (1.676 m), weight 49.4 kg, SpO2 99 %. General appearance: alert and cooperative Resp: clear to auscultation bilaterally Cardio: regular rate and rhythm, S1, S2 normal, no murmur, click, rub or gallop GI: soft, non-tender; bowel sounds normal; no masses,  no organomegaly Neurologic: Grossly normal  Disposition:   Transfer to [facility]    No current facility-administered medications on file prior to encounter.   Current Outpatient Medications on File Prior to Encounter  Medication Sig Dispense Refill  . albuterol (PROVENTIL HFA;VENTOLIN HFA) 108 (90 Base) MCG/ACT inhaler Inhale 1-2 puffs into the lungs every 6 (six) hours as needed for wheezing or shortness of breath.    . diazepam (VALIUM) 10 MG tablet Take 1 tablet (10 mg total) by mouth at bedtime. For severe anxiety (Patient taking differently: Take 10 mg by mouth as needed for anxiety. For severe anxiety) 5 tablet 0  . fluticasone  (FLOVENT HFA) 110 MCG/ACT inhaler Inhale 1 puff into the lungs 2 (two) times daily.     [facility] gabapentin (NEURONTIN) 100 MG capsule Take 100 mg by mouth as needed (anxiety).     Marland Kitchen lamoTRIgine (LAMICTAL) 100 MG tablet Take 100 mg by mouth daily.    . Norgestimate-Ethinyl Estradiol Triphasic (TRI-LO-MARZIA) 0.18/0.215/0.25 MG-25 MCG tab Take 1 tablet by mouth at bedtime. Birth control method 1 Package 11  . phenazopyridine (PYRIDIUM) 100 MG tablet Take 1 tablet (100 mg total) by mouth 3 (three) times daily. For bladder spasms (Patient taking differently: Take 100 mg by mouth as needed for pain (bladder spasms). ) 15 tablet 0  . phenazopyridine (PYRIDIUM) 200 MG tablet Take 1 tablet (200 mg total) by mouth daily as needed (urinary symptoms). (Patient taking differently: Take 200 mg by mouth as needed for pain (urinary symptoms). ) 1 tablet 0  . hydrOXYzine (ATARAX/VISTARIL) 25 MG tablet Take 1 tablet (25 mg total) by mouth every 8 (eight) hours as needed for anxiety. (Patient not taking: Reported on 04/12/2020) 60 tablet 0  . meloxicam (MOBIC) 15 MG tablet Take 1 tablet (15 mg total) by mouth daily. For pain (Patient not taking: Reported on 03/02/2020) 5 tablet 0  . sertraline (ZOLOFT) 25 MG tablet Take 1 tablet (25 mg total) by mouth daily. For depression (Patient not taking: Reported on 07/22/2019) 30 tablet 0       Signed: 07/24/2019 04/12/2020, 4:42 PM

## 2020-04-12 NOTE — Progress Notes (Signed)
[employer] updated that patient is ready to transfer.

## 2020-04-12 NOTE — Progress Notes (Signed)
AVS reviewed with patient.

## 2020-04-12 NOTE — Progress Notes (Signed)
[facility] ADULT ICU REPLACEMENT PROTOCOL   The patient does apply for the [facility] Adult ICU Electrolyte Replacment Protocol based on the criteria listed below:   1. Is GFR >/= 30 ml/min? Yes.    Patient's GFR today is >60 2. Is SCr </= 2? Yes.   Patient's SCr is 0.60 ml/kg/hr 3. Did SCr increase >/= 0.5 in 24 hours? No. 4. Abnormal electrolyte(s): K 3.1 5. Ordered repletion with: protocol 6. If a panic level lab has been reported, has the CCM MD in charge been notified? No..   Physician:    Markus Daft A 04/12/2020 2:20 AM

## 2020-04-12 NOTE — Progress Notes (Signed)
Critical Care attending  Patient has BH bed available. She is medically cleared to go. Med-Surg request made to move her out of ICU pending BH availability.  Lynnell Catalan, MD [facility] ICU Physician [facility] [place] [facility]  Pager: [phone] Or Epic Secure Chat After hours: [phone].  04/12/2020, 3:42 PM

## 2020-04-12 NOTE — Progress Notes (Signed)
CSW messaged [facility] intake/Sarah Gaynell Face about bed availability. Daleen Squibb, MSW, LCSW 11/17/20212:45 PM

## 2020-04-12 NOTE — Progress Notes (Signed)
Patient discharged with [facility] to go to [facility]. [employer] given patient's belongings and two copies of IVC paperwork. AVS paperwork put into patient's backpack.

## 2020-04-12 NOTE — Progress Notes (Signed)
Posey restraint removed and discontinued. She is alert/oriented to person, place, time, situation. Calm and cooperative. Tearful at times When discussing events that led to her hospitalization. No need or indication for the use of any type of restraints at this time. VS stable. Continuing to monitor.

## 2020-04-12 NOTE — Progress Notes (Signed)
   NAME:  Alexis [name], MRN:  [id number], DOB:  [birth year], LOS: 3 ADMISSION DATE:  04/09/2020, CONSULTATION DATE:  04/09/20 REFERRING MD:  Dr. Stevie Kern, CHIEF COMPLAINT:  encephalopathy   Brief History   19 year old woman here with acute encephalopathy and agitation after intentional overdose. Became less responsive after initially combative. Had stimulus induced myoclonic activity   Found unresponsive by sister with empty bottle of gabapentin.  She also found empty bottle of lamictal.  ETOH level 35 mg/dl.    She also has access to diazepam, escitalopram, certraline, hydroxizine, meloxicam,  and tramadol.  Past Medical History  Anxiety, Asthma, Depression, GERD Migraines, interstitial cystitis, Back pain, chronic, Pelvic pain, Vaginal yeast infections  Significant Hospital Events     Consults:  Neurology  Procedures:  Intubation 04/09/20  Significant Diagnostic Tests:  CT head 11/14: normal  EEG/LTM 11/15: study is suggestive of severe diffuse encephalopathy, nonspecific etiology but most likely secondary to sedation.  No seizures or epileptiform discharges   Micro Data:    Antimicrobials:    Interim history/subjective:  No acute events. Psych recommending inpatient treatment. IVC paperwork competed 11/16.  Objective   Blood pressure 102/67, pulse 97, temperature 98.8 F (37.1 C), temperature source Oral, resp. rate (!) 21, height 5\' 6"  (1.676 m), weight 49.4 kg, SpO2 98 %.        Intake/Output Summary (Last 24 hours) at 04/12/2020 0753 Last data filed at 04/12/2020 0400 Gross per 24 hour  Intake 2909.89 ml  Output 3300 ml  Net -390.11 ml   Filed Weights   04/10/20 0309 04/11/20 0500  Weight: 49.4 kg 49.4 kg    Examination: General: Well-nourished well-developed female no acute distress HEENT: NG tube in place will be removed Neuro: Grossly intact without focal defect there was strange affect CV: Heart sounds are regular PULM: Lungs are clear to  auscultation  GI: soft, bsx4 active  Extremities: warm/dry,  edema  Skin: no rashes or lesions   Resolved Hospital Problem list     Assessment & Plan:   Acute toxic and metabolic encephalopathy w/ myoclonic activity - Acute intentional drug overdose; though, pt denies SI on 04/11/20.  Likely took large amount gabapentin and lamotrigine.  Also possibly has access to diazepam, escitalopram, certraline, hydroxizine, meloxicam,  and tramadol.  Pysch consulted 11/16, recommending IVC and inpatient admission for ongoing treatment. Plan Continue 12/16. Transfer out of ICU. Will inquire about bed availability at [facility]. Continue valium 5mg  q8hrs for now and start to wean starting 11/18. Continue PRN ativan. Further management per psych once transfers.  Diplopia - possibly 2/2 overdose. Plan MRI brain if continues.  Hypokalemia - being repleted. Plan Monitor BMP.  OK to transfer out of ICU to med surge.  Ultimately needs admission to [facility] once a bed is available.  Will ask [facility] to assume care in AM 11/18 with PCCM off at that time.  Best practice:  Diet: Regular Pain/Anxiety/Delirium protocol (if indicated): None VAP protocol (if indicated): N/A DVT prophylaxis: heparin GI prophylaxis: pepcid Glucose control: prn Mobility: Out of bed Code Status: Full  Family Communication: None 16 2021 patient updated at bedside Disposition: Transfer out of ICU to med surge.  Once bed at behavioral health is available, will need to be transferred there per psych recs.   12/18, 2022 [facility] 04/12/2020, 8:07 AM
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
## 2020-04-13 ENCOUNTER — Ambulatory Visit: Payer: Medicaid Other | Admitting: Physical Therapy

## 2020-04-13 ENCOUNTER — Other Ambulatory Visit: Payer: Self-pay

## 2020-04-13 LAB — COMPREHENSIVE METABOLIC PANEL
ALT: 10 U/L (ref 0–44)
AST: 16 U/L (ref 15–41)
Albumin: 4.6 g/dL (ref 3.5–5.0)
Alkaline Phosphatase: 51 U/L (ref 38–126)
Anion gap: 12 (ref 5–15)
BUN: 7 mg/dL (ref 6–20)
CO2: 27 mmol/L (ref 22–32)
Calcium: 9.9 mg/dL (ref 8.9–10.3)
Chloride: 99 mmol/L (ref 98–111)
Creatinine, Ser: 0.69 mg/dL (ref 0.44–1.00)
GFR, Estimated: >60 mL/min (ref >60)
Glucose, Bld: 95 mg/dL (ref 70–99)
Potassium: 3.7 mmol/L (ref 3.5–5.1)
Sodium: 138 mmol/L (ref 135–145)
Total Bilirubin: 0.5 mg/dL (ref 0.3–1.2)
Total Protein: 7.9 g/dL (ref 6.5–8.1)

## 2020-04-13 LAB — LIPID PANEL
Cholesterol: 180 mg/dL (ref 0–200)
HDL: 66 mg/dL (ref >40)
LDL Cholesterol: 87 mg/dL (ref 0–99)
Total CHOL/HDL Ratio: 2.7 RATIO
Triglycerides: 134 mg/dL (ref <150)
VLDL: 27 mg/dL (ref 0–40)

## 2020-04-13 LAB — HEMOGLOBIN A1C
Hgb A1c MFr Bld: 5.1 % (ref 4.8–5.6)
Mean Plasma Glucose: 99.67 mg/dL

## 2020-04-13 LAB — CBC
HCT: 46 % (ref 36.0–46.0)
Hemoglobin: 14.9 g/dL (ref 12.0–15.0)
MCH: 30 pg (ref 26.0–34.0)
MCHC: 32.4 g/dL (ref 30.0–36.0)
MCV: 92.7 fL (ref 80.0–100.0)
Platelets: 229 10*3/uL (ref 150–400)
RBC: 4.96 MIL/uL (ref 3.87–5.11)
RDW: 12.3 % (ref 11.5–15.5)
WBC: 5.7 10*3/uL (ref 4.0–10.5)
nRBC: 0 % (ref 0.0–0.2)

## 2020-04-13 LAB — TSH: TSH: 1.017 u[IU]/mL (ref 0.350–4.500)
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
## 2020-04-13 NOTE — BHH Group Notes (Signed)
The focus of this group is to help patients establish daily goals to achieve during treatment and discuss how the patient can incorporate goal setting into their daily lives to aide in recovery.   Patient attended group and contributed to group. 
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
## 2020-04-13 NOTE — BHH Suicide Risk Assessment (Signed)
[facility] [place] Admission Suicide Risk Assessment   Nursing information obtained from:  Patient Demographic factors:  Caucasian Current Mental Status:  NA Loss Factors:  NA Historical Factors:  Prior suicide attempts, Impulsivity, Family history of suicide (grandmother) Risk Reduction Factors:  Positive therapeutic relationship, Positive social support  Total Time spent with patient: 30 minutes Principal Problem: <principal problem not specified> Diagnosis:  Active Problems:   MDD (major depressive disorder), recurrent episode, severe (HCC)  Subjective Data: Patient is seen and examined.  Patient is a 19 year old female with a reported past psychiatric history significant for bipolar disorder who originally presented to the [facility] emergency department on 04/09/2020 after an intentional overdose.  She was brought to the hospital by emergency services.  She had been found by her sister and her to patient's apartment.  She was unresponsive with empty bottles of gabapentin at the bedside.  Her blood alcohol on admission was 35.  She was reportedly combative on admission but nonverbal when she arrived in the emergency department, and was intubated.  Neurology consultation was obtained.  Diagnosis was encephalopathy that was multifactorial.  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  Keppra was started for seizure protection.  She underwent an EEG that showed slow continuous theta-delta slowing.  It was suggestive of severe diffuse encephalopathy.  No seizures or epileptiform discharges were noted.  After extubation she was evaluated by the psychiatric consultation service.  The patient denied taking gabapentin however medical team feels as though she did overdose on gabapentin as well as Lamictal.  There were 2 empty bottles found at her bedside after the overdose.  At that time she denied any  recent stressors or triggers.  She did admit to impulsive behaviors and poor impulse control.  She also reported 5 suicide attempts in the past.  Recommendation was for inpatient treatment.  On examination today the patient denied suicidal ideation.  She stated that she had recently started working at [employer], and that they had "started yelling at me".  She stated that was the only stressor that she could recall.  She stated that she is followed at [facility].  She stated she had been diagnosed with bipolar disorder in the past.  She admitted to episodes of several days at a time without getting tired, driving around [facility] for days at a time, as well as euphoria and irritability and impulsivity.  Her medications in the intensive care unit included diazepam as needed, gabapentin as needed anxiety, lamotrigine 100 mg p.o. daily, hydroxyzine, Ativan, birth control.  She was also placed on Zoloft, but she stated that Zoloft made her feel suicidal and wanted that stopped.  Her last psychiatric hospitalization at our facility was in November 2019.  She presented to the [facility] emergency department at that time after an intentional overdose of hydroxyzine and diazepam.  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  On review of her previous medication she denied having previously been treated with lithium, Depakote, Seroquel, Zyprexa, Abilify or other mood stabilizers.  She stated that she did not want to gain weight.  Her discharge medications at that time included albuterol, diazepam, Pepcid, hydroxyzine, meloxicam, oral contraceptives, Pyridium, sertraline and tramadol.  She denied current suicidality.  She was admitted to our facility for continued treatment and evaluation.  Continued Clinical Symptoms:  Alcohol Use Disorder Identification Test  Final Score (AUDIT): 6 The "Alcohol Use Disorders Identification Test", Guidelines  for Use in Primary Care, Second Edition.  World Science writer [facility]). Score between 0-7:  no or low risk or alcohol related problems. Score between 8-15:  moderate risk of alcohol related problems. Score between 16-19:  high risk of alcohol related problems. Score 20 or above:  warrants further diagnostic evaluation for alcohol dependence and treatment.   CLINICAL FACTORS:   Bipolar Disorder:   Mixed State Personality Disorders:   Cluster B Previous Psychiatric Diagnoses and Treatments   Musculoskeletal: Strength & Muscle Tone: within normal limits Gait & Station: normal Patient leans: N/A  Psychiatric Specialty Exam: Physical Exam Vitals and nursing note reviewed.  Constitutional:      Appearance: Normal appearance.  HENT:     Head: Normocephalic and atraumatic.  Pulmonary:     Effort: Pulmonary effort is normal.  Neurological:     General: No focal deficit present.     Mental Status: She is alert and oriented to person, place, and time.     Review of Systems  Blood pressure (!) 125/92, pulse (!) 107, temperature 98.9 F (37.2 C), temperature source Oral, resp. rate 16, height 5\' 6"  (1.676 m), weight 47.2 kg, last menstrual period 04/12/2020, SpO2 97 %.Body mass index is 16.79 kg/m.  General Appearance: Disheveled  Eye Contact:  Fair  Speech:  Normal Rate  Volume:  Normal  Mood:  Anxious  Affect:  Congruent  Thought Process:  Coherent and Descriptions of Associations: Circumstantial  Orientation:  Full (Time, Place, and Person)  Thought Content:  Logical  Suicidal Thoughts:  No  Homicidal Thoughts:  No  Memory:  Immediate;   Fair Recent;   Fair Remote;   Fair  Judgement:  Intact  Insight:  Lacking  Psychomotor Activity:  Decreased  Concentration:  Concentration: Fair and Attention Span: Fair  Recall:  04/14/2020 of Knowledge:  Fair  Language:  Good  Akathisia:  Negative  Handed:  Right  AIMS (if indicated):     Assets:  Desire for  Improvement Housing Resilience Social Support  ADL's:  Intact  Cognition:  WNL  Sleep:  Number of Hours: 4.25      COGNITIVE FEATURES THAT CONTRIBUTE TO RISK:  None    SUICIDE RISK:   Moderate:  Frequent suicidal ideation with limited intensity, and duration, some specificity in terms of plans, no associated intent, good self-control, limited dysphoria/symptomatology, some risk factors present, and identifiable protective factors, including available and accessible social support.  PLAN OF CARE: Patient is seen and examined.  Patient is a 19 year old female with the above-stated past psychiatric history was transferred to our facility after an intentional overdose.  She will be admitted to the hospital.  She will be integrated in the milieu.  She will be encouraged to attend groups.  I have stopped the Zoloft, and we will restart fluoxetine 20 mg p.o. daily.  We will monitor for any manic symptoms that could potentially show up.  We will continue the Lamictal at 100 mg p.o. daily, and there will be consideration of increasing that dosage if necessary.  The PMP database revealed her last diazepam prescription from 12/24/2019.  She received 2010 mg tablets.  There were no refills.  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  We will try and wean this down to a lower dosage.  Additionally, I will increase her Neurontin to 100 mg p.o. 3 times daily for anxiety and mood stability.  We will see if that is effective.  We will continue the lorazepam 1 mg p.o. every 6 hours as needed a CIWA greater than 10.  The patient stated that she is planning on going to stay with her sister after discharge, and we will contact her for collateral information.  Review of her laboratories from the medical admission revealed normal electrolytes including creatinine and liver function enzymes.  Her CK on admission to the medical floor was only 45.  Lipid panel was normal.  CBC from 11/18 was essentially normal  including platelets at 229,000.  PT and INR were within normal limits.  Her Lamictal level on 11/15 was 31.  This basically confirms the lamotrigine overdose.  Her prolactin was 33.8.  Pregnancy test was negative.  TSH was normal at 1.017.  Urinalysis from 11/14 showed trace ketones but otherwise essentially negative.  Blood alcohol on admission was 35.  Drug screen was positive for marijuana.  Her CT scan of the head on admission at [facility] showed essentially normal examination with some mild abnormalities in her sinuses.  Her EKG showed a sinus tachycardia with a rate of 114 and a normal QTc interval.  Currently her vital signs are stable, she is afebrile.  I certify that inpatient services furnished can reasonably be expected to improve the patient's condition.   Antonieta Pert, MD 04/13/2020, 10:36 AM

## 2020-04-13 NOTE — Tx Team (Signed)
Initial Treatment Plan 04/13/2020 12:42 AM Alexis [name] ANV:[id number]    PATIENT STRESSORS: Other: nothing stressing pt out at the moment   PATIENT STRENGTHS: Average or above average intelligence Capable of independent living Motivation for treatment/growth Supportive family/friends   PATIENT IDENTIFIED PROBLEMS: Suicide attempt (OD Lamictal)  Bipolar d/o (per pt)  Poor impulse control  Anxiety  Depression  ("wants to work on anxiety and depression")           DISCHARGE CRITERIA:  Improved stabilization in mood, thinking, and/or behavior Motivation to continue treatment in a less acute level of care Verbal commitment to aftercare and medication compliance  PRELIMINARY DISCHARGE PLAN: Attend aftercare/continuing care group Attend PHP/IOP Outpatient therapy Return to previous living arrangement Return to previous work or school arrangements  PATIENT/FAMILY INVOLVEMENT: This treatment plan has been presented to and reviewed with the patient, Alexis [name], and/or family member.  The patient and family have been given the opportunity to ask questions and make suggestions.  Victorino December, RN 04/13/2020, 12:42 AM

## 2020-04-13 NOTE — Progress Notes (Signed)
Pt denies SI/HI/AVH. Pt says that she feels she does not need to be at [facility] and that she should be allowed to go home. Pt denies anxiety and depression today.  Pt is pleasant and supportive of her peers on the unit.  Pt took medications without incident.  Pt remains safe with q 15 min checks in place.

## 2020-04-13 NOTE — Progress Notes (Signed)
   04/12/20 2320  Psych Admission Type (Psych Patients Only)  Admission Status Involuntary  Psychosocial Assessment  Patient Complaints Anxiety;Depression  Eye Contact Brief  Facial Expression Anxious  Affect Anxious;Depressed  Speech Logical/coherent  Interaction Other (Comment) (appropriate)  Motor Activity Slow  Appearance/Hygiene Unremarkable  Behavior Characteristics Cooperative;Anxious  Mood Depressed;Anxious;Pleasant  Thought Process  Coherency WDL  Content WDL  Delusions None reported or observed  Perception WDL  Hallucination None reported or observed  Judgment Poor  Confusion None  Danger to Self  Current suicidal ideation? Denies  Danger to Others  Danger to Others None reported or observed

## 2020-04-13 NOTE — Progress Notes (Signed)
NUTRITION ASSESSMENT  Pt identified as at risk on the Malnutrition Screen Tool  INTERVENTION: 1. Supplements: Ensure Plus BID, each provides 350 kcals and 13g protein  NUTRITION DIAGNOSIS: Unintentional weight loss related to sub-optimal intake as evidenced by pt report.   Goal: Pt to meet >/= 90% of their estimated nutrition needs.  Monitor:  PO intake  Assessment:  Pt admitted to [facility] from [facility] following intentional overdose. Pt was intubated 11/14 and extubated 11/15. IVC. Per weight records, pt weighed 108 lbs on 11/15. Current weight: 103 lbs. Will order Ensure supplements given recent stay in ICU, intubation and underweight status.    Height: Ht Readings from Last 1 Encounters:  04/12/20 5\' 6"  (1.676 m)    Weight: Wt Readings from Last 1 Encounters:  04/12/20 47.2 kg    Weight Hx: Wt Readings from Last 10 Encounters:  04/12/20 47.2 kg  04/11/20 49.4 kg  04/19/18 49 kg (13 %, Z= -1.13)*  04/19/18 (S) 49.9 kg (16 %, Z= -0.98)*  04/10/18 52.5 kg (27 %, Z= -0.60)*  09/29/17 47.4 kg (9 %, Z= -1.34)*   * Growth percentiles are based on CDC (Girls, 2-20 Years) data.    BMI:  Body mass index is 16.79 kg/m. Pt meets criteria for underweight based on current BMI.  Estimated Nutritional Needs: Kcal: 25-30 kcal/kg Protein: > 1 gram protein/kg Fluid: 1 ml/kcal  Diet Order:  Diet Order            Diet regular Room service appropriate? Yes; Fluid consistency: Thin  Diet effective now                Pt is also offered choice of unit snacks mid-morning and mid-afternoon.  Pt is eating as desired.   Lab results and medications reviewed.   11/29/17, MS, RD, LDN Inpatient Clinical Dietitian Contact information available via Amion

## 2020-04-14 MED ORDER — BISACODYL 10 MG RE SUPP: 10.0000 mg | Freq: Every day | RECTAL | Status: DC | PRN

## 2020-04-14 MED ORDER — LIDOCAINE VISCOUS HCL 2 % MT SOLN
15.0000 mL | OROMUCOSAL | Status: DC | PRN
  Filled 2020-04-14: qty 15

## 2020-04-14 MED ORDER — PHENAZOPYRIDINE HCL 100 MG PO TABS
100.0000 mg | ORAL_TABLET | Freq: Three times a day (TID) | ORAL | Status: DC
  Administered 2020-04-14 (×2): 100 mg via ORAL
  Filled 2020-04-14 (×14): qty 1

## 2020-04-14 MED ORDER — POLYETHYLENE GLYCOL 3350 17 G PO PACK
17.0000 g | PACK | Freq: Every day | ORAL | Status: DC | PRN
  Filled 2020-04-14: qty 1

## 2020-04-14 NOTE — Progress Notes (Signed)
The focus of this group is to help patients establish daily goals to achieve during treatment and discuss how the patient can incorporate goal setting into their daily lives to aide in recovery.Thye patient expressed that era goal is to be responsible herself.

## 2020-04-14 NOTE — Progress Notes (Signed)
Did not attend group 

## 2020-04-14 NOTE — Tx Team (Signed)
Interdisciplinary Treatment and Diagnostic Plan Update  04/14/2020 Time of Session: 9:20am  Alexis [name] MRN: [id number]  Principal Diagnosis: <principal problem not specified>  Secondary Diagnoses: Active Problems:   MDD (major depressive disorder), recurrent episode, severe (HCC)   Current Medications:  Current Facility-Administered Medications  Medication Dose Route Frequency Provider Last Rate Last Admin  . acetaminophen (TYLENOL) tablet 650 mg  650 mg Oral Q6H PRN Aldean Baker, NP   650 mg at 04/14/20 0908  . albuterol (VENTOLIN HFA) 108 (90 Base) MCG/ACT inhaler 1-2 puff  1-2 puff Inhalation Q6H PRN Aldean Baker, NP      . alum & mag hydroxide-simeth (MAALOX/MYLANTA) 200-200-20 MG/5ML suspension 30 mL  30 mL Oral Q4H PRN Aldean Baker, NP      . diazepam (VALIUM) tablet 5 mg  5 mg Oral Q8H Lynnell Catalan, MD   5 mg at 04/14/20 9323  . famotidine (PEPCID) tablet 20 mg  20 mg Oral BID Aldean Baker, NP   20 mg at 04/14/20 0905  . feeding supplement (ENSURE ENLIVE / ENSURE PLUS) liquid 237 mL  237 mL Oral BID BM Money, Feliz Beam B, FNP   237 mL at 04/13/20 1351  . fluticasone (FLOVENT HFA) 110 MCG/ACT inhaler 1 puff  1 puff Inhalation BID Cristofano, Worthy Rancher, MD   1 puff at 04/14/20 0915  . gabapentin (NEURONTIN) capsule 100 mg  100 mg Oral PRN Lynnell Catalan, MD      . hydrOXYzine (ATARAX/VISTARIL) tablet 25 mg  25 mg Oral Q8H PRN Agarwala, Daleen Bo, MD      . hydrOXYzine (ATARAX/VISTARIL) tablet 25 mg  25 mg Oral TID PRN Aldean Baker, NP      . lamoTRIgine (LAMICTAL) tablet 100 mg  100 mg Oral Daily Lynnell Catalan, MD   100 mg at 04/14/20 0905  . loperamide (IMODIUM) capsule 2-4 mg  2-4 mg Oral PRN Aldean Baker, NP      . LORazepam (ATIVAN) tablet 1 mg  1 mg Oral Q6H PRN Aldean Baker, NP      . magnesium hydroxide (MILK OF MAGNESIA) suspension 30 mL  30 mL Oral Daily PRN Aldean Baker, NP      . multivitamin with minerals tablet 1 tablet  1 tablet Oral Daily Aldean Baker,  NP   1 tablet at 04/14/20 0905  . Norgestimate-Ethinyl Estradiol Triphasic 0.18/0.215/0.25 MG-25 MCG tablet 1 tablet  1 tablet Oral QHS Agarwala, Ravi, MD      . ondansetron (ZOFRAN-ODT) disintegrating tablet 4 mg  4 mg Oral Q6H PRN Aldean Baker, NP      . thiamine tablet 100 mg  100 mg Oral Daily Aldean Baker, NP   100 mg at 04/14/20 0905  . traZODone (DESYREL) tablet 50 mg  50 mg Oral QHS PRN Aldean Baker, NP       PTA Medications: Medications Prior to Admission  Medication Sig Dispense Refill Last Dose  . albuterol (PROVENTIL HFA;VENTOLIN HFA) 108 (90 Base) MCG/ACT inhaler Inhale 1-2 puffs into the lungs every 6 (six) hours as needed for wheezing or shortness of breath.     . fluticasone (FLOVENT HFA) 110 MCG/ACT inhaler Inhale 1 puff into the lungs 2 (two) times daily.      Marland Kitchen lamoTRIgine (LAMICTAL) 100 MG tablet Take 100 mg by mouth daily.     . Norgestimate-Ethinyl Estradiol Triphasic (TRI-LO-MARZIA) 0.18/0.215/0.25 MG-25 MCG tab Take 1 tablet by mouth at bedtime. Birth control method 1  Package 11   . phenazopyridine (PYRIDIUM) 100 MG tablet Take 1 tablet (100 mg total) by mouth 3 (three) times daily. For bladder spasms (Patient taking differently: Take 100 mg by mouth as needed for pain (bladder spasms). ) 15 tablet 0   . phenazopyridine (PYRIDIUM) 200 MG tablet Take 1 tablet (200 mg total) by mouth daily as needed (urinary symptoms). (Patient taking differently: Take 200 mg by mouth as needed for pain (urinary symptoms). ) 1 tablet 0     Patient Stressors: Other: nothing stressing pt out at the moment  Patient Strengths: Average or above average intelligence Capable of independent living Motivation for treatment/growth Supportive family/friends  Treatment Modalities: Medication Management, Group therapy, Case management,  1 to 1 session with clinician, Psychoeducation, Recreational therapy.   Physician Treatment Plan for Primary Diagnosis: <principal problem not  specified> Long Term Goal(s):     Short Term Goals:    Medication Management: Evaluate patient's response, side effects, and tolerance of medication regimen.  Therapeutic Interventions: 1 to 1 sessions, Unit Group sessions and Medication administration.  Evaluation of Outcomes: Progressing  Physician Treatment Plan for Secondary Diagnosis: Active Problems:   MDD (major depressive disorder), recurrent episode, severe (HCC)  Long Term Goal(s):     Short Term Goals:       Medication Management: Evaluate patient's response, side effects, and tolerance of medication regimen.  Therapeutic Interventions: 1 to 1 sessions, Unit Group sessions and Medication administration.  Evaluation of Outcomes: Progressing   RN Treatment Plan for Primary Diagnosis: <principal problem not specified> Long Term Goal(s): Knowledge of disease and therapeutic regimen to maintain health will improve  Short Term Goals: Ability to remain free from injury will improve, Ability to participate in decision making will improve, Ability to verbalize feelings will improve, Ability to disclose and discuss suicidal ideas and Ability to identify and develop effective coping behaviors will improve  Medication Management: RN will administer medications as ordered by provider, will assess and evaluate patient's response and provide education to patient for prescribed medication. RN will report any adverse and/or side effects to prescribing provider.  Therapeutic Interventions: 1 on 1 counseling sessions, Psychoeducation, Medication administration, Evaluate responses to treatment, Monitor vital signs and CBGs as ordered, Perform/monitor CIWA, COWS, AIMS and Fall Risk screenings as ordered, Perform wound care treatments as ordered.  Evaluation of Outcomes: Progressing   LCSW Treatment Plan for Primary Diagnosis: <principal problem not specified> Long Term Goal(s): Safe transition to appropriate next level of care at  discharge, Engage patient in therapeutic group addressing interpersonal concerns.  Short Term Goals: Engage patient in aftercare planning with referrals and resources, Increase social support, Increase emotional regulation, Facilitate acceptance of mental health diagnosis and concerns, Identify triggers associated with mental health/substance abuse issues and Increase skills for wellness and recovery  Therapeutic Interventions: Assess for all discharge needs, 1 to 1 time with Social worker, Explore available resources and support systems, Assess for adequacy in community support network, Educate family and significant other(s) on suicide prevention, Complete Psychosocial Assessment, Interpersonal group therapy.  Evaluation of Outcomes: Progressing   Progress in Treatment: Attending groups: Yes. Participating in groups: Yes. Taking medication as prescribed: Yes. Toleration medication: Yes. Family/Significant other contact made: Yes, individual(s) contacted:  Father  Patient understands diagnosis: Yes. Discussing patient identified problems/goals with staff: Yes. Medical problems stabilized or resolved: Yes. Denies suicidal/homicidal ideation: Yes. Issues/concerns per patient self-inventory: No.   New problem(s) identified: No, Describe:  None   New Short Term/Long Term Goal(s): medication stabilization,  elimination of SI thoughts, development of comprehensive mental wellness plan.   Patient Goals:  "To get out of here and work on my anxiety"   Discharge Plan or Barriers:  Patient recently admitted. CSW will continue to follow and assess for appropriate referrals and possible discharge planning.   Reason for Continuation of Hospitalization: Anxiety Depression Medication stabilization Suicidal ideation  Estimated Length of Stay: 3 to 5 days   Attendees: Patient: Alexis [name] 04/14/2020   Physician: Pricilla Larsson, MD 04/14/2020   Nursing:  04/14/2020   RN Care Manager:  04/14/2020   Social Worker: Melba Coon, LCSWA 04/14/2020   Recreational Therapist:  04/14/2020   Other:  04/14/2020   Other:  04/14/2020   Other: 04/14/2020      Scribe for Treatment Team: Aram Beecham, LCSWA 04/14/2020 10:47 AM

## 2020-04-14 NOTE — BHH Group Notes (Signed)
LCSW Group Therapy Note  04/14/2020   1430  Type of Therapy and Topic:  Group Therapy: What's So Funny About Mental Illness?  Participation Level:  Active   Description of Group:   In this group, patients learned how to recognize how they personally define their mental illness. This group also addressed stigma associated with mental illness.  Patients were asked to give examples of experiences surrounding living with a mental illness and how it makes them feel. Patients were asked to self-reflect on how they have chosen to address their mental health thus far and were invited to share those lessons or to discuss how stigma has affected their mental health care. Patients actively explore how their mental illness has impacted decisions and actions as well as how future decisions can be impacted.  Therapeutic Goals: 1. Patients will identify what can be considered mental illness 2. Patients will identify lessons learned from past experiences and how they can be applied to future struggles. 3. Patients will establish rapport with peers in a therapeutic setting.  Summary of Patient Progress:  The patient shared that she uses jokes to cope and that her family does not think it is funny.  Kortney remained in the group the entire time and listened openly to his peers.   Therapeutic Modalities:   Cognitive Behavioral Therapy    Aram Beecham, LCSW 04/14/2020  3:00 PM

## 2020-04-14 NOTE — Progress Notes (Signed)
   04/14/20 2141  Psych Admission Type (Psych Patients Only)  Admission Status Involuntary  Psychosocial Assessment  Patient Complaints None  Eye Contact Fair  Facial Expression Other (Comment) (appropriate)  Affect Appropriate to circumstance  Speech Logical/coherent  Interaction Assertive  Motor Activity Other (Comment) (wnl)  Appearance/Hygiene Unremarkable  Behavior Characteristics Appropriate to situation;Cooperative  Mood Pleasant  Thought Process  Coherency WDL  Content WDL  Delusions None reported or observed  Perception WDL  Hallucination None reported or observed  Judgment WDL  Confusion None  Danger to Self  Current suicidal ideation? Denies  Danger to Others  Danger to Others None reported or observed   Pt pleasant. Looking forward to discharge. Pain 5/10 from bladder spasms. Anxiety 4/10 and depression 1/10. Anxious about outside concerns. Pt counseled to take it one day at a time and bring up any needs to the Child psychotherapist. Pt agreed.

## 2020-04-14 NOTE — Progress Notes (Signed)
Pt during medication pass, denies suicidal and homicidal ideation, denies hallucinations, denies feelings of depression and anxiety. Pt is calm, cooperative pleasant, focused on discharge. Pt is medication compliant. No distress noted, none reported. Will continue to monitor pt per Q15 minute face checks and monitor for safety and progress.

## 2020-04-14 NOTE — Progress Notes (Signed)
Pt denies SI/HI/AVH.  Pt is calm and says she's ready for discharge.  Pt interacting with peers in dayroom and talking on the phone a lot throughout the day.  RN provided support and administered medications.  Pt did not have any adverse reactions to medications and no major concerns are identified at this time.  Pt remains safe with q 15 min checks in place.

## 2020-04-14 NOTE — H&P (Signed)
Psychiatric Admission Assessment Adult  Patient Identification: Alexis [name] MRN:  [id number] Date of Evaluation:  04/14/2020 Chief Complaint:  MDD (major depressive disorder), recurrent episode, severe (HCC) [F33.2] Principal Diagnosis: <principal problem not specified> Diagnosis:  Active Problems:   MDD (major depressive disorder), recurrent episode, severe (HCC)  History of Present Illness: Patient is seen and examined.  Patient is a 19 year old female with a reported past psychiatric history significant for bipolar disorder who originally presented to the [facility] emergency department on 04/09/2020 after an intentional overdose.  She was brought to the hospital by emergency services.  She had been found by her sister and her to patient's apartment.  She was unresponsive with empty bottles of gabapentin at the bedside.  Her blood alcohol on admission was 35.  She was reportedly combative on admission but nonverbal when she arrived in the emergency department, and was intubated.  Neurology consultation was obtained.  Diagnosis was encephalopathy that was multifactorial.  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  Keppra was started for seizure protection.  She underwent an EEG that showed slow continuous theta-delta slowing.  It was suggestive of severe diffuse encephalopathy.  No seizures or epileptiform discharges were noted.  After extubation she was evaluated by the psychiatric consultation service.  The patient denied taking gabapentin however medical team feels as though she did overdose on gabapentin as well as Lamictal.  There were 2 empty bottles found at her bedside after the overdose.  At that time she denied any recent stressors or triggers.  She did admit to impulsive behaviors and poor impulse control.  She also reported 5 suicide attempts in the past.  Recommendation  was for inpatient treatment.  On examination today the patient denied suicidal ideation.  She stated that she had recently started working at [employer], and that they had "started yelling at me".  She stated that was the only stressor that she could recall.  She stated that she is followed at [facility].  She stated she had been diagnosed with bipolar disorder in the past.  She admitted to episodes of several days at a time without getting tired, driving around [facility] for days at a time, as well as euphoria and irritability and impulsivity.  Her medications in the intensive care unit included diazepam as needed, gabapentin as needed anxiety, lamotrigine 100 mg p.o. daily, hydroxyzine, Ativan, birth control.  She was also placed on Zoloft, but she stated that Zoloft made her feel suicidal and wanted that stopped.  Her last psychiatric hospitalization at our facility was in November 2019.  She presented to the [facility] emergency department at that time after an intentional overdose of hydroxyzine and diazepam.  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  On review of her previous medication she denied having previously been treated with lithium, Depakote, Seroquel, Zyprexa, Abilify or other mood stabilizers.  She stated that she did not want to gain weight.  Her discharge medications at that time included albuterol, diazepam, Pepcid, hydroxyzine, meloxicam, oral contraceptives, Pyridium, sertraline and tramadol.  She denied current suicidality.  She was admitted to our facility for continued treatment and evaluation.  Associated Signs/Symptoms: Depression Symptoms:  depressed mood, anhedonia, fatigue, feelings of worthlessness/guilt, hopelessness, suicidal thoughts with specific plan, suicidal attempt, anxiety, loss of energy/fatigue, Duration of Depression Symptoms: No data recorded (Hypo) Manic  Symptoms:   Impulsivity, Irritable Mood, Labiality of Mood, Anxiety Symptoms:  Excessive Worry, Psychotic Symptoms:  Denied Duration of Psychotic Symptoms: No data recorded PTSD Symptoms: Had a traumatic exposure:  Denied Total Time spent with patient: 45 minutes  Past Psychiatric History: Patient's last hospitalization to our facility was in 04/19/2018.  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  She received therapy there.  Her admission in 04/19/2018 was her first psychiatric admission.  She did admit on this admission that this was the fifth time she had attempted to overdose.  Is the patient at risk to self? Yes.    Has the patient been a risk to self in the past 6 months? Yes.    Has the patient been a risk to self within the distant past? Yes.    Is the patient a risk to others? No.  Has the patient been a risk to others in the past 6 months? No.  Has the patient been a risk to others within the distant past? No.   Prior Inpatient Therapy:   Prior Outpatient Therapy:    Alcohol Screening: 1. How often do you have a drink containing alcohol?: 2 to 3 times a week 2. How many drinks containing alcohol do you have on a typical day when you are drinking?: 3 or 4 3. How often do you have six or more drinks on one occasion?: Less than monthly AUDIT-C Score: 5 4. How often during the last year have you found that you were not able to stop drinking once you had started?: Never 5. How often during the last year have you failed to do what was normally expected from you because of drinking?: Never 6. How often during the last year have you needed a first drink in the morning to get yourself going after a heavy drinking session?: Never 7. How often during the last year have you had a feeling of guilt of remorse after drinking?: Never 8. How often during the last year have you been unable to remember what happened the night before  because you had been drinking?: Less than monthly 9. Have you or someone else been injured as a result of your drinking?: No 10. Has a relative or friend or a doctor or another health worker been concerned about your drinking or suggested you cut down?: No Alcohol Use Disorder Identification Test Final Score (AUDIT): 6 Alcohol Brief Interventions/Follow-up: AUDIT Score <7 follow-up not indicated Substance Abuse History in the last 12 months:  No. Consequences of Substance Abuse: Negative Previous Psychotropic Medications: Yes  Psychological Evaluations: Yes  Past Medical History:  Past Medical History:  Diagnosis Date  . Anxiety   . Asthma   . Bipolar disorder (HCC)    per pt  . Depression   . GERD (gastroesophageal reflux disease)   . Migraines     Past Surgical History:  Procedure Laterality Date  . CYSTO WITH HYDRODISTENSION N/A 04/10/2018   Procedure: CYSTOSCOPY/HYDRODISTENSION;  Surgeon: Crist Fat, MD;  Location[name];  Service: Urology;  Laterality: N/A;  . UPPER GI ENDOSCOPY     Family History:  Family History  Problem Relation Age of Onset  . Asthma Mother   . Thyroid disease Mother   . Healthy Sister    Family Psychiatric  History: Patient reported mother with bipolar disorder as well as borderline personality disorder.  Father reportedly suffers from an unspecified psychiatric  illness. Tobacco Screening: Have you used any form of tobacco in the last 30 days? (Cigarettes, Smokeless Tobacco, Cigars, and/or Pipes): No Social History:  Social History   Substance and Sexual Activity  Alcohol Use Yes  . Alcohol/week: 4.0 standard drinks  . Types: 4 Standard drinks or equivalent per week   Comment: socially     Social History   Substance and Sexual Activity  Drug Use Never    Additional Social History: Marital status: Other (comment) (When asked if she is in a relationship she stated "kinda") Are you sexually active?: Yes What is  your sexual orientation?: Bisexual Has your sexual activity been affected by drugs, alcohol, medication, or emotional stress?: Denies Does patient have children?: No                         Allergies:   Allergies  Allergen Reactions  . Other Other (See Comments)    All acidic foods cause extreme bladder pain  . Tomato Other (See Comments)    Causes burning and bladder issues    Lab Results:  Results for orders placed or performed during the hospital encounter of 04/12/20 (from the past 48 hour(s))  CBC     Status: None   Collection Time: 04/13/20  6:24 AM  Result Value Ref Range   WBC 5.7 4.0 - 10.5 K/uL   RBC 4.96 3.87 - 5.11 MIL/uL   Hemoglobin 14.9 12.0 - 15.0 g/dL   HCT 19.1 36 - 46 %   MCV 92.7 80.0 - 100.0 fL   MCH 30.0 26.0 - 34.0 pg   MCHC 32.4 30.0 - 36.0 g/dL   RDW 47.8 29.5 - 62.1 %   Platelets 229 150 - 400 K/uL   nRBC 0.0 0.0 - 0.2 %    Comment: Performed at [facility], [address], [place], [place] [address]  Comprehensive metabolic panel     Status: None   Collection Time: 04/13/20  6:24 AM  Result Value Ref Range   Sodium 138 135 - 145 mmol/L   Potassium 3.7 3.5 - 5.1 mmol/L   Chloride 99 98 - 111 mmol/L   CO2 27 22 - 32 mmol/L   Glucose, Bld 95 70 - 99 mg/dL    Comment: Glucose reference range applies only to samples taken after fasting for at least 8 hours.   BUN 7 6 - 20 mg/dL   Creatinine, Ser 7.84 0.44 - 1.00 mg/dL   Calcium 9.9 8.9 - 69.6 mg/dL   Total Protein 7.9 6.5 - 8.1 g/dL   Albumin 4.6 3.5 - 5.0 g/dL   AST 16 15 - 41 U/L   ALT 10 0 - 44 U/L   Alkaline Phosphatase 51 38 - 126 U/L   Total Bilirubin 0.5 0.3 - 1.2 mg/dL   GFR, Estimated >29 >52 mL/min    Comment: (NOTE) Calculated using the CKD-EPI Creatinine Equation (2021)    Anion gap 12 5 - 15    Comment: Performed at [facility], [address] [place], [place] [address]  Hemoglobin A1c     Status: None   Collection Time:  04/13/20  6:24 AM  Result Value Ref Range   Hgb A1c MFr Bld 5.1 4.8 - 5.6 %    Comment: (NOTE) Pre diabetes:          5.7%-6.4%  Diabetes:              >6.4%  Glycemic  control for   <7.0% adults with diabetes    Mean Plasma Glucose 99.67 mg/dL    Comment: Performed at [facility], [address] [place], [place] [address]  Lipid panel     Status: None   Collection Time: 04/13/20  6:24 AM  Result Value Ref Range   Cholesterol 180 0 - 200 mg/dL   Triglycerides 509 <326 mg/dL   HDL 66 >71 mg/dL   Total CHOL/HDL Ratio 2.7 RATIO   VLDL 27 0 - 40 mg/dL   LDL Cholesterol 87 0 - 99 mg/dL    Comment:        Total Cholesterol/HDL:CHD Risk Coronary Heart Disease Risk Table                     Men   Women  1/2 Average Risk   3.4   3.3  Average Risk       5.0   4.4  2 X Average Risk   9.6   7.1  3 X Average Risk  23.4   11.0        Use the calculated Patient Ratio above and the CHD Risk Table to determine the patient's CHD Risk.        ATP III CLASSIFICATION (LDL):  <100     mg/dL   Optimal  245-809  mg/dL   Near or Above                    Optimal  130-159  mg/dL   Borderline  983-382  mg/dL   High  >505     mg/dL   Very High Performed at [facility], [address] [place], [place] [address]   TSH     Status: None   Collection Time: 04/13/20  6:24 AM  Result Value Ref Range   TSH 1.017 0.350 - 4.500 uIU/mL    Comment: Performed by a 3rd Generation assay with a functional sensitivity of <=0.01 uIU/mL. Performed at [facility], [address], [place], [place] [address]     Blood Alcohol level:  Lab Results  Component Value Date   ETH 35 (H) 04/09/2020   ETH <10 04/19/2018    Metabolic Disorder Labs:  Lab Results  Component Value Date   HGBA1C 5.1 04/13/2020   MPG 99.67 04/13/2020   Lab Results  Component Value Date   PROLACTIN 33.8 (H) 04/09/2020   Lab Results  Component Value Date   CHOL 180 04/13/2020    TRIG 134 04/13/2020   HDL 66 04/13/2020   CHOLHDL 2.7 04/13/2020   VLDL 27 04/13/2020   LDLCALC 87 04/13/2020    Current Medications: Current Facility-Administered Medications  Medication Dose Route Frequency Provider Last Rate Last Admin  . acetaminophen (TYLENOL) tablet 650 mg  650 mg Oral Q6H PRN Aldean Baker, NP   650 mg at 04/14/20 0908  . albuterol (VENTOLIN HFA) 108 (90 Base) MCG/ACT inhaler 1-2 puff  1-2 puff Inhalation Q6H PRN Aldean Baker, NP      . alum & mag hydroxide-simeth (MAALOX/MYLANTA) 200-200-20 MG/5ML suspension 30 mL  30 mL Oral Q4H PRN Aldean Baker, NP      . bisacodyl (DULCOLAX) suppository 10 mg  10 mg Rectal Daily PRN Antonieta Pert, MD      . diazepam (VALIUM) tablet 5 mg  5 mg Oral Q8H Lynnell Catalan, MD   5 mg at 04/14/20 7902  . famotidine (PEPCID)  tablet 20 mg  20 mg Oral BID Aldean Baker, NP   20 mg at 04/14/20 0905  . feeding supplement (ENSURE ENLIVE / ENSURE PLUS) liquid 237 mL  237 mL Oral BID BM Money, Gerlene Burdock, FNP   237 mL at 04/14/20 1228  . fluticasone (FLOVENT HFA) 110 MCG/ACT inhaler 1 puff  1 puff Inhalation BID Cristofano, Worthy Rancher, MD   1 puff at 04/14/20 0915  . gabapentin (NEURONTIN) capsule 100 mg  100 mg Oral PRN Lynnell Catalan, MD      . hydrOXYzine (ATARAX/VISTARIL) tablet 25 mg  25 mg Oral Q8H PRN Agarwala, Daleen Bo, MD      . hydrOXYzine (ATARAX/VISTARIL) tablet 25 mg  25 mg Oral TID PRN Aldean Baker, NP      . lamoTRIgine (LAMICTAL) tablet 100 mg  100 mg Oral Daily Lynnell Catalan, MD   100 mg at 04/14/20 0905  . lidocaine (XYLOCAINE) 2 % viscous mouth solution 15 mL  15 mL Mouth/Throat Q4H PRN Antonieta Pert, MD      . loperamide (IMODIUM) capsule 2-4 mg  2-4 mg Oral PRN Aldean Baker, NP      . LORazepam (ATIVAN) tablet 1 mg  1 mg Oral Q6H PRN Aldean Baker, NP      . magnesium hydroxide (MILK OF MAGNESIA) suspension 30 mL  30 mL Oral Daily PRN Aldean Baker, NP      . multivitamin with minerals tablet 1 tablet  1 tablet  Oral Daily Aldean Baker, NP   1 tablet at 04/14/20 0905  . Norgestimate-Ethinyl Estradiol Triphasic 0.18/0.215/0.25 MG-25 MCG tablet 1 tablet  1 tablet Oral QHS Agarwala, Ravi, MD      . ondansetron (ZOFRAN-ODT) disintegrating tablet 4 mg  4 mg Oral Q6H PRN Aldean Baker, NP      . phenazopyridine (PYRIDIUM) tablet 100 mg  100 mg Oral TID WC Antonieta Pert, MD   100 mg at 04/14/20 1224  . polyethylene glycol (MIRALAX / GLYCOLAX) packet 17 g  17 g Oral Daily PRN Antonieta Pert, MD      . thiamine tablet 100 mg  100 mg Oral Daily Aldean Baker, NP   100 mg at 04/14/20 0905  . traZODone (DESYREL) tablet 50 mg  50 mg Oral QHS PRN Aldean Baker, NP       PTA Medications: Medications Prior to Admission  Medication Sig Dispense Refill Last Dose  . albuterol (PROVENTIL HFA;VENTOLIN HFA) 108 (90 Base) MCG/ACT inhaler Inhale 1-2 puffs into the lungs every 6 (six) hours as needed for wheezing or shortness of breath.     . fluticasone (FLOVENT HFA) 110 MCG/ACT inhaler Inhale 1 puff into the lungs 2 (two) times daily.      Marland Kitchen lamoTRIgine (LAMICTAL) 100 MG tablet Take 100 mg by mouth daily.     . Norgestimate-Ethinyl Estradiol Triphasic (TRI-LO-MARZIA) 0.18/0.215/0.25 MG-25 MCG tab Take 1 tablet by mouth at bedtime. Birth control method 1 Package 11   . phenazopyridine (PYRIDIUM) 100 MG tablet Take 1 tablet (100 mg total) by mouth 3 (three) times daily. For bladder spasms (Patient taking differently: Take 100 mg by mouth as needed for pain (bladder spasms). ) 15 tablet 0   . phenazopyridine (PYRIDIUM) 200 MG tablet Take 1 tablet (200 mg total) by mouth daily as needed (urinary symptoms). (Patient taking differently: Take 200 mg by mouth as needed for pain (urinary symptoms). ) 1 tablet 0     Musculoskeletal:  Strength & Muscle Tone: within normal limits Gait & Station: normal Patient leans: N/A  Psychiatric Specialty Exam: Physical Exam Vitals and nursing note reviewed.  Constitutional:       Appearance: Normal appearance.  HENT:     Head: Normocephalic and atraumatic.  Pulmonary:     Effort: Pulmonary effort is normal.  Neurological:     General: No focal deficit present.     Mental Status: She is alert and oriented to person, place, and time.     Review of Systems  Blood pressure 109/76, pulse (!) 118, temperature 98 F (36.7 C), temperature source Oral, resp. rate 18, height 5\' 6"  (1.676 m), weight 47.2 kg, last menstrual period 04/12/2020, SpO2 97 %.Body mass index is 16.79 kg/m.  General Appearance: Disheveled  Eye Contact:  Fair  Speech:  Normal Rate  Volume:  Normal  Mood:  Anxious  Affect:  Congruent  Thought Process:  Coherent and Descriptions of Associations: Circumstantial  Orientation:  Full (Time, Place, and Person)  Thought Content:  Logical  Suicidal Thoughts:  No  Homicidal Thoughts:  No  Memory:  Immediate;   Fair Recent;   Fair Remote;   Fair  Judgement:  Intact  Insight:  Lacking  Psychomotor Activity:  Decreased  Concentration:  Concentration: Fair  Recall:  Fair  Fund of Knowledge:  Good  Language:  Good  Akathisia:  Negative  Handed:  Right  AIMS (if indicated):     Assets:  Desire for Improvement Housing Resilience Social Support  ADL's:  Intact  Cognition:  WNL  Sleep:  Number of Hours: 4.25    Treatment Plan Summary: Daily contact with patient to assess and evaluate symptoms and progress in treatment, Medication management and Plan : Patient is a 19 year old female with the above-stated past psychiatric history was transferred to our facility after an intentional overdose.  She will be admitted to the hospital.  She will be integrated in the milieu.  She will be encouraged to attend groups.  I have stopped the Zoloft, and we will restart fluoxetine 20 mg p.o. daily.  We will monitor for any manic symptoms that could potentially show up.  We will continue the Lamictal at 100 mg p.o. daily, and there will be consideration of  increasing that dosage if necessary.  The PMP database revealed her last diazepam prescription from 12/24/2019.  She received 2010 mg tablets.  There were no refills.  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  We will try and wean this down to a lower dosage.  Additionally, I will increase her Neurontin to 100 mg p.o. 3 times daily for anxiety and mood stability.  We will see if that is effective.  We will continue the lorazepam 1 mg p.o. every 6 hours as needed a CIWA greater than 10.  The patient stated that she is planning on going to stay with her sister after discharge, and we will contact her for collateral information.  Review of her laboratories from the medical admission revealed normal electrolytes including creatinine and liver function enzymes.  Her CK on admission to the medical floor was only 45.  Lipid panel was normal.  CBC from 11/18 was essentially normal including platelets at 229,000.  PT and INR were within normal limits.  Her Lamictal level on 11/15 was 31.  This basically confirms the lamotrigine overdose.  Her prolactin was 33.8.  Pregnancy test was negative.  TSH was normal at 1.017.  Urinalysis from 11/14 showed trace ketones  but otherwise essentially negative.  Blood alcohol on admission was 35.  Drug screen was positive for marijuana.  Her CT scan of the head on admission at [facility] showed essentially normal examination with some mild abnormalities in her sinuses.  Her EKG showed a sinus tachycardia with a rate of 114 and a normal QTc interval.  Currently her vital signs are stable, she is afebrile.  Observation Level/Precautions:  15 minute checks Seizure  Laboratory:  Chemistry Profile  Psychotherapy:    Medications:    Consultations:    Discharge Concerns:    Estimated LOS:  Other:     Physician Treatment Plan for Primary Diagnosis: <principal problem not specified> Long Term Goal(s): Improvement in symptoms so as ready for discharge  Short Term Goals:  Ability to identify changes in lifestyle to reduce recurrence of condition will improve, Ability to verbalize feelings will improve, Ability to disclose and discuss suicidal ideas, Ability to demonstrate self-control will improve, Ability to identify and develop effective coping behaviors will improve and Ability to maintain clinical measurements within normal limits will improve  Physician Treatment Plan for Secondary Diagnosis: Active Problems:   MDD (major depressive disorder), recurrent episode, severe (HCC)  Long Term Goal(s): Improvement in symptoms so as ready for discharge  Short Term Goals: Ability to identify changes in lifestyle to reduce recurrence of condition will improve, Ability to verbalize feelings will improve, Ability to disclose and discuss suicidal ideas, Ability to demonstrate self-control will improve, Ability to identify and develop effective coping behaviors will improve and Ability to maintain clinical measurements within normal limits will improve  I certify that inpatient services furnished can reasonably be expected to improve the patient's condition.    Antonieta Pert, MD 11/19/20212:05 PM

## 2020-04-14 NOTE — Progress Notes (Signed)
[facility] MD Progress Note  04/14/2020 12:20 PM Alexis [name]  MRN:  [id number] Subjective: Patient is a 19 year old female with a reported past psychiatric history significant for bipolar disorder who originally presented to the [facility] emergency department on 04/09/2020 after an intentional overdose of gabapentin as well as lamotrigine.  Objective: Patient is seen and examined.  Patient is a 19 year old female with the above-stated past psychiatric history who is seen in follow-up.  She continues to minimize her symptoms.  Her primary concern today is her pelvic floor problem.  She is having some problems with her interstitial cystitis and requested Pyridium.  She also requested something for constipation.  We discussed options including MiraLAX and Dulcolax suppositories.  She also stated she was having some throat irritation from the previous intubation that was required.  She denies suicidal or homicidal ideation.  We discussed the seriousness of her attempt.  We discussed my concern for her safety.  She has already told the nursing staff, and requested of me to be discharged in the next day or so.  We discussed her previous psychiatric follow-up.  She stated she wanted to get a new psychiatrist, and that her therapist had a psychiatrist in their office.  Unfortunately he does not remember the name of the therapist nor a phone number.  She has attempted to contact her sister to find out some information that we could arrange follow-up.  I told her that most likely if we were unable to get that information and contact them that she would not be able to be discharged at least until Monday.  She stated she would try and assist with that.  Her pulse is elevated today at 118.  Her blood pressure stable.  She is afebrile.  Nursing notes show she only slept 4.25 hours last night.  Her laboratories from 11/18 showed normal electrolytes including creatinine and liver function enzymes.  Lipid panel  was normal.  CBC was normal.  Hemoglobin A1c was 5.1.  TSH was 1.017.  Principal Problem: <principal problem not specified> Diagnosis: Active Problems:   MDD (major depressive disorder), recurrent episode, severe (HCC)  Total Time spent with patient: 20 minutes  Past Psychiatric History: See admission H&P  Past Medical History:  Past Medical History:  Diagnosis Date  . Anxiety   . Asthma   . Bipolar disorder (HCC)    per pt  . Depression   . GERD (gastroesophageal reflux disease)   . Migraines     Past Surgical History:  Procedure Laterality Date  . CYSTO WITH HYDRODISTENSION N/A 04/10/2018   Procedure: CYSTOSCOPY/HYDRODISTENSION;  Surgeon: Crist Fat, MD;  Location: [facility] [place];  Service: Urology;  Laterality: N/A;  . UPPER GI ENDOSCOPY     Family History:  Family History  Problem Relation Age of Onset  . Asthma Mother   . Thyroid disease Mother   . Healthy Sister    Family Psychiatric  History: See admission H&P Social History:  Social History   Substance and Sexual Activity  Alcohol Use Yes  . Alcohol/week: 4.0 standard drinks  . Types: 4 Standard drinks or equivalent per week   Comment: socially     Social History   Substance and Sexual Activity  Drug Use Never    Social History   Socioeconomic History  . Marital status: Single    Spouse name: Not on file  . Number of children: 0  . Years of education: 35  . Highest education level:  Some college, no degree  Occupational History  . Not on file  Tobacco Use  . Smoking status: Never Smoker  . Smokeless tobacco: Never Used  Vaping Use  . Vaping Use: Never used  Substance and Sexual Activity  . Alcohol use: Yes    Alcohol/week: 4.0 standard drinks    Types: 4 Standard drinks or equivalent per week    Comment: socially  . Drug use: Never  . Sexual activity: Yes    Birth control/protection: Pill  Other Topics Concern  . Not on file  Social History Narrative  . Not on file    Social Determinants of Health   Financial Resource Strain:   . Difficulty of Paying Living Expenses: Not on file  Food Insecurity:   . Worried About Programme researcher, broadcasting/film/video in the Last Year: Not on file  . Ran Out of Food in the Last Year: Not on file  Transportation Needs:   . Lack of Transportation (Medical): Not on file  . Lack of Transportation (Non-Medical): Not on file  Physical Activity:   . Days of Exercise per Week: Not on file  . Minutes of Exercise per Session: Not on file  Stress:   . Feeling of Stress : Not on file  Social Connections:   . Frequency of Communication with Friends and Family: Not on file  . Frequency of Social Gatherings with Friends and Family: Not on file  . Attends Religious Services: Not on file  . Active Member of Clubs or Organizations: Not on file  . Attends Banker Meetings: Not on file  . Marital Status: Not on file   Additional Social History:                         Sleep: Fair  Appetite:  Fair  Current Medications: Current Facility-Administered Medications  Medication Dose Route Frequency Provider Last Rate Last Admin  . acetaminophen (TYLENOL) tablet 650 mg  650 mg Oral Q6H PRN Aldean Baker, NP   650 mg at 04/14/20 0908  . albuterol (VENTOLIN HFA) 108 (90 Base) MCG/ACT inhaler 1-2 puff  1-2 puff Inhalation Q6H PRN Aldean Baker, NP      . alum & mag hydroxide-simeth (MAALOX/MYLANTA) 200-200-20 MG/5ML suspension 30 mL  30 mL Oral Q4H PRN Aldean Baker, NP      . bisacodyl (DULCOLAX) suppository 10 mg  10 mg Rectal Daily PRN Antonieta Pert, MD      . diazepam (VALIUM) tablet 5 mg  5 mg Oral Q8H Lynnell Catalan, MD   5 mg at 04/14/20 0981  . famotidine (PEPCID) tablet 20 mg  20 mg Oral BID Aldean Baker, NP   20 mg at 04/14/20 0905  . feeding supplement (ENSURE ENLIVE / ENSURE PLUS) liquid 237 mL  237 mL Oral BID BM Money, Feliz Beam B, FNP   237 mL at 04/13/20 1351  . fluticasone (FLOVENT HFA) 110 MCG/ACT  inhaler 1 puff  1 puff Inhalation BID Cristofano, Worthy Rancher, MD   1 puff at 04/14/20 0915  . gabapentin (NEURONTIN) capsule 100 mg  100 mg Oral PRN Lynnell Catalan, MD      . hydrOXYzine (ATARAX/VISTARIL) tablet 25 mg  25 mg Oral Q8H PRN Agarwala, Daleen Bo, MD      . hydrOXYzine (ATARAX/VISTARIL) tablet 25 mg  25 mg Oral TID PRN Aldean Baker, NP      . lamoTRIgine (LAMICTAL) tablet 100 mg  100 mg  Oral Daily Lynnell Catalan, MD   100 mg at 04/14/20 0905  . lidocaine (XYLOCAINE) 2 % viscous mouth solution 15 mL  15 mL Mouth/Throat Q4H PRN Antonieta Pert, MD      . loperamide (IMODIUM) capsule 2-4 mg  2-4 mg Oral PRN Aldean Baker, NP      . LORazepam (ATIVAN) tablet 1 mg  1 mg Oral Q6H PRN Aldean Baker, NP      . magnesium hydroxide (MILK OF MAGNESIA) suspension 30 mL  30 mL Oral Daily PRN Aldean Baker, NP      . multivitamin with minerals tablet 1 tablet  1 tablet Oral Daily Aldean Baker, NP   1 tablet at 04/14/20 0905  . Norgestimate-Ethinyl Estradiol Triphasic 0.18/0.215/0.25 MG-25 MCG tablet 1 tablet  1 tablet Oral QHS Agarwala, Ravi, MD      . ondansetron (ZOFRAN-ODT) disintegrating tablet 4 mg  4 mg Oral Q6H PRN Aldean Baker, NP      . phenazopyridine (PYRIDIUM) tablet 100 mg  100 mg Oral TID WC Antonieta Pert, MD      . polyethylene glycol (MIRALAX / GLYCOLAX) packet 17 g  17 g Oral Daily PRN Antonieta Pert, MD      . thiamine tablet 100 mg  100 mg Oral Daily Aldean Baker, NP   100 mg at 04/14/20 0905  . traZODone (DESYREL) tablet 50 mg  50 mg Oral QHS PRN Aldean Baker, NP        Lab Results:  Results for orders placed or performed during the hospital encounter of 04/12/20 (from the past 48 hour(s))  CBC     Status: None   Collection Time: 04/13/20  6:24 AM  Result Value Ref Range   WBC 5.7 4.0 - 10.5 K/uL   RBC 4.96 3.87 - 5.11 MIL/uL   Hemoglobin 14.9 12.0 - 15.0 g/dL   HCT 76.1 36 - 46 %   MCV 92.7 80.0 - 100.0 fL   MCH 30.0 26.0 - 34.0 pg   MCHC 32.4 30.0 -  36.0 g/dL   RDW 60.7 37.1 - 06.2 %   Platelets 229 150 - 400 K/uL   nRBC 0.0 0.0 - 0.2 %    Comment: Performed at [facility], [address] [place], [place] [address]  Comprehensive metabolic panel     Status: None   Collection Time: 04/13/20  6:24 AM  Result Value Ref Range   Sodium 138 135 - 145 mmol/L   Potassium 3.7 3.5 - 5.1 mmol/L   Chloride 99 98 - 111 mmol/L   CO2 27 22 - 32 mmol/L   Glucose, Bld 95 70 - 99 mg/dL    Comment: Glucose reference range applies only to samples taken after fasting for at least 8 hours.   BUN 7 6 - 20 mg/dL   Creatinine, Ser 4.62 0.44 - 1.00 mg/dL   Calcium 9.9 8.9 - 70.3 mg/dL   Total Protein 7.9 6.5 - 8.1 g/dL   Albumin 4.6 3.5 - 5.0 g/dL   AST 16 15 - 41 U/L   ALT 10 0 - 44 U/L   Alkaline Phosphatase 51 38 - 126 U/L   Total Bilirubin 0.5 0.3 - 1.2 mg/dL   GFR, Estimated >50 >09 mL/min    Comment: (NOTE) Calculated using the CKD-EPI Creatinine Equation (2021)    Anion gap 12 5 - 15    Comment: Performed at [facility], [address], [place], [place] [address]  Hemoglobin A1c     Status: None   Collection Time: 04/13/20  6:24 AM  Result Value Ref Range   Hgb A1c MFr Bld 5.1 4.8 - 5.6 %    Comment: (NOTE) Pre diabetes:          5.7%-6.4%  Diabetes:              >6.4%  Glycemic control for   <7.0% adults with diabetes    Mean Plasma Glucose 99.67 mg/dL    Comment: Performed at [facility], [address], [place], [place] [address]  Lipid panel     Status: None   Collection Time: 04/13/20  6:24 AM  Result Value Ref Range   Cholesterol 180 0 - 200 mg/dL   Triglycerides 992 <426 mg/dL   HDL 66 >83 mg/dL   Total CHOL/HDL Ratio 2.7 RATIO   VLDL 27 0 - 40 mg/dL   LDL Cholesterol 87 0 - 99 mg/dL    Comment:        Total Cholesterol/HDL:CHD Risk Coronary Heart Disease Risk Table                     Men   Women  1/2 Average Risk   3.4   3.3  Average Risk       5.0   4.4  2 X Average  Risk   9.6   7.1  3 X Average Risk  23.4   11.0        Use the calculated Patient Ratio above and the CHD Risk Table to determine the patient's CHD Risk.        ATP III CLASSIFICATION (LDL):  <100     mg/dL   Optimal  419-622  mg/dL   Near or Above                    Optimal  130-159  mg/dL   Borderline  297-989  mg/dL   High  >211     mg/dL   Very High Performed at [facility], [address] [place], [place] [address]   TSH     Status: None   Collection Time: 04/13/20  6:24 AM  Result Value Ref Range   TSH 1.017 0.350 - 4.500 uIU/mL    Comment: Performed by a 3rd Generation assay with a functional sensitivity of <=0.01 uIU/mL. Performed at [facility], [address] [place], [place] [address]     Blood Alcohol level:  Lab Results  Component Value Date   ETH 35 (H) 04/09/2020   ETH <10 04/19/2018    Metabolic Disorder Labs: Lab Results  Component Value Date   HGBA1C 5.1 04/13/2020   MPG 99.67 04/13/2020   Lab Results  Component Value Date   PROLACTIN 33.8 (H) 04/09/2020   Lab Results  Component Value Date   CHOL 180 04/13/2020   TRIG 134 04/13/2020   HDL 66 04/13/2020   CHOLHDL 2.7 04/13/2020   VLDL 27 04/13/2020   LDLCALC 87 04/13/2020    Physical Findings: AIMS:  , ,  ,  ,    CIWA:  CIWA-Ar Total: 0 COWS:     Musculoskeletal: Strength & Muscle Tone: within normal limits Gait & Station: normal Patient leans: N/A  Psychiatric Specialty Exam: Physical Exam Vitals and nursing note reviewed.  Constitutional:      Appearance: Normal appearance.  HENT:     Head:  Normocephalic and atraumatic.  Pulmonary:     Effort: Pulmonary effort is normal.  Neurological:     General: No focal deficit present.     Mental Status: She is alert and oriented to person, place, and time.     Review of Systems  Blood pressure 109/76, pulse (!) 118, temperature 98 F (36.7 C), temperature source Oral, resp. rate 18, height  5\' 6"  (1.676 m), weight 47.2 kg, last menstrual period 04/12/2020, SpO2 97 %.Body mass index is 16.79 kg/m.  General Appearance: Casual  Eye Contact:  Fair  Speech:  Normal Rate  Volume:  Decreased  Mood:  Anxious  Affect:  Congruent  Thought Process:  Coherent and Descriptions of Associations: Circumstantial  Orientation:  Full (Time, Place, and Person)  Thought Content:  Logical  Suicidal Thoughts:  No  Homicidal Thoughts:  No  Memory:  Immediate;   Fair Recent;   Fair Remote;   Fair  Judgement:  Intact  Insight:  Fair  Psychomotor Activity:  Normal  Concentration:  Concentration: Good and Attention Span: Good  Recall:  Good  Fund of Knowledge:  Good  Language:  Good  Akathisia:  Negative  Handed:  Right  AIMS (if indicated):     Assets:  Desire for Improvement Housing Resilience Social Support  ADL's:  Intact  Cognition:  WNL  Sleep:  Number of Hours: 4.25     Treatment Plan Summary: Daily contact with patient to assess and evaluate symptoms and progress in treatment, Medication management and Plan : Patient is seen and examined.  Patient is a 19 year old female with the above-stated past psychiatric history who is seen in follow-up.   Diagnosis: 1.  Major depression, recurrent, severe without psychotic features versus bipolar disorder, most recently depressed 2.  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  Recent seizure most likely secondary to medication overdose 4.  Alcohol use disorder 5.  Marijuana use disorder  Pertinent findings on examination today: 1.  Patient denies suicidal or homicidal ideation. 2.  Patient minimizes current circumstances of what occurred with the overdose. 3.  Patient complains of interstitial cystitis/pelvic floor issues. 4.  Poor sleep  Plan: 1.  Continue albuterol 1 to 2 puffs as needed wheezing or shortness of breath. 2.  Dulcolax suppository 10 mg PR daily as needed constipation. 3.  Continue diazepam 5 mg p.o.  every 8 hours as needed pelvic floor pain. 4.  Continue Pepcid 20 mg p.o. twice daily for gastric protection. 5.  Continue gabapentin 100 mg p.o. 3 times daily as needed anxiety. 6.  Continue Vistaril 25 mg p.o. 3 times daily as needed anxiety. 7.  Continue Lamictal 100 mg p.o. daily for mood stability. 8.  Start Pyridium 100 mg p.o. 3 times daily with food for urinary discomfort. 9.  Continue Zofran 4 mg p.o. every 6 hours as needed nausea and vomiting. 10.  Continue oral contraceptives. 11.  Start MiraLAX 17 g in 8 ounces of water as needed constipation. 12.  Continue thiamine 100 mg p.o. daily for nutritional supplementation. 13.  Start lidocaine viscous mouth solution 15 mL for throat discomfort. 14.  Increase trazodone to 100 mg p.o. nightly as needed insomnia. 15.  Disposition planning-in progress.  Antonieta PertGreg Lawson Hayzen Lorenson, MD 04/14/2020, 12:20 PM

## 2020-04-14 NOTE — Progress Notes (Signed)
Recreation Therapy Notes  Date:  11.19.21 Time: 0930 Location: [address] Dayroom  Group Topic: Stress Management  Goal Area(s) Addresses:  Patient will identify positive stress management techniques. Patient will identify benefits of using stress management post d/c.  Intervention: Stress Management  Activity: Guided Imagery.  LRT read a script that took patients for a walk along the [place]ands of a relaxing beach.  Patients were to listen and follow along as script was read to engage in activity.    Education: Stress Management, Discharge Planning.   Education Outcome: Acknowledges Education  Clinical Observations/Feedback: Pt did not attend activity.      Caroll Rancher, LRT/CTRS        Caroll Rancher A 04/14/2020 11:21 AM

## 2020-04-15 NOTE — Progress Notes (Signed)
Patient presents with an anxious mood today. On approach, she was calm, cooperative and voiced little anxiety. Late morning, she reported that her heart was beating fast and she felt like she was going to pass out. She reported that she wanted to see a doctor and not a nurse. Patient vital signs assessed and heart rate was 108 around 9 am. Patient was given fluids because pulse was 142 at 6:14 am. Writer provided verbal support to the patient and discussed symptoms of anxiety. She was given Vistaril at that time. She later reported that the Vistaril was effective. She was encouraged to attend groups but reported that the nursing students were using her as a [place] pig and experimenting on her. She has been observed to be irritable, child-like and attention seeking. She denies SI/HI. She denies AVH.   Orders reviewed. Verbal support provided. Vital signs reviewed. Dr. [name] was informed of the patient's c/o heart beating fast. 15 minute checks performed for safety.   Pt compliant with taking meds and denies any side effects.

## 2020-04-15 NOTE — Progress Notes (Signed)
[facility] MD Progress Note  04/15/2020 2:41 PM Alexis [name]  MRN:  [id number] Subjective:  Patient is a 19 year old female with a reported past psychiatric history significant for bipolar disorder who originally presented to the [facility] emergency department on 04/09/2020 after an intentional overdose of gabapentin as well as lamotrigine.  Objective: Patient is seen and examined.  Patient is a 19 year old female with the above-stated past psychiatric history who is seen in follow-up.  She is a bit irritable today.  She is unhappy about the food, and also her preferred Ensure was not available.  She denied any suicidal or homicidal ideation.  We discussed today the significance and seriousness of her overdose.  She had wanted to go home today, and we discussed the fact that I was trying to make sure she was stable over more than just a couple of days.  She was able to make contact with her therapist and talk to him twice yesterday.  That made her feel better.  We discussed the fact that she is probably going to be discharged tomorrow.  She was happy about that.  She denied any side effects to her current medications.  She had had some mild tachycardia between 108 113.  Her blood pressure stable.  She slept 6.25 hours last night.  No new laboratories.   Principal Problem: <principal problem not specified> Diagnosis: Active Problems:   MDD (major depressive disorder), recurrent episode, severe (HCC)  Total Time spent with patient: 20 minutes  Past Psychiatric History: See admission H&P  Past Medical History:  Past Medical History:  Diagnosis Date  . Anxiety   . Asthma   . Bipolar disorder (HCC)    per pt  . Depression   . GERD (gastroesophageal reflux disease)   . Migraines     Past Surgical History:  Procedure Laterality Date  . CYSTO WITH HYDRODISTENSION N/A 04/10/2018   Procedure: CYSTOSCOPY/HYDRODISTENSION;  Surgeon: Crist Fat, MD;  Location: [facility];  Service: Urology;  Laterality: N/A;  . UPPER GI ENDOSCOPY     Family History:  Family History  Problem Relation Age of Onset  . Asthma Mother   . Thyroid disease Mother   . Healthy Sister    Family Psychiatric  History: See admission H&P Social History:  Social History   Substance and Sexual Activity  Alcohol Use Yes  . Alcohol/week: 4.0 standard drinks  . Types: 4 Standard drinks or equivalent per week   Comment: socially     Social History   Substance and Sexual Activity  Drug Use Never    Social History   Socioeconomic History  . Marital status: Single    Spouse name: Not on file  . Number of children: 0  . Years of education: 68  . Highest education level: Some college, no degree  Occupational History  . Not on file  Tobacco Use  . Smoking status: Never Smoker  . Smokeless tobacco: Never Used  Vaping Use  . Vaping Use: Never used  Substance and Sexual Activity  . Alcohol use: Yes    Alcohol/week: 4.0 standard drinks    Types: 4 Standard drinks or equivalent per week    Comment: socially  . Drug use: Never  . Sexual activity: Yes    Birth control/protection: Pill  Other Topics Concern  . Not on file  Social History Narrative  . Not on file   Social Determinants of Health   Financial Resource Strain:   .  Difficulty of Paying Living Expenses: Not on file  Food Insecurity:   . Worried About Programme researcher, broadcasting/film/video in the Last Year: Not on file  . Ran Out of Food in the Last Year: Not on file  Transportation Needs:   . Lack of Transportation (Medical): Not on file  . Lack of Transportation (Non-Medical): Not on file  Physical Activity:   . Days of Exercise per Week: Not on file  . Minutes of Exercise per Session: Not on file  Stress:   . Feeling of Stress : Not on file  Social Connections:   . Frequency of Communication with Friends and Family: Not on file  . Frequency of Social Gatherings with Friends and Family: Not on file  .  Attends Religious Services: Not on file  . Active Member of Clubs or Organizations: Not on file  . Attends Banker Meetings: Not on file  . Marital Status: Not on file   Additional Social History:                         Sleep: Good  Appetite:  Fair  Current Medications: Current Facility-Administered Medications  Medication Dose Route Frequency Provider Last Rate Last Admin  . acetaminophen (TYLENOL) tablet 650 mg  650 mg Oral Q6H PRN Aldean Baker, NP   650 mg at 04/14/20 0908  . albuterol (VENTOLIN HFA) 108 (90 Base) MCG/ACT inhaler 1-2 puff  1-2 puff Inhalation Q6H PRN Aldean Baker, NP      . alum & mag hydroxide-simeth (MAALOX/MYLANTA) 200-200-20 MG/5ML suspension 30 mL  30 mL Oral Q4H PRN Aldean Baker, NP      . bisacodyl (DULCOLAX) suppository 10 mg  10 mg Rectal Daily PRN Antonieta Pert, MD      . diazepam (VALIUM) tablet 5 mg  5 mg Oral Q8H Agarwala, Daleen Bo, MD   5 mg at 04/15/20 1438  . famotidine (PEPCID) tablet 20 mg  20 mg Oral BID Aldean Baker, NP   20 mg at 04/15/20 0800  . feeding supplement (ENSURE ENLIVE / ENSURE PLUS) liquid 237 mL  237 mL Oral BID BM Money, Feliz Beam B, FNP   237 mL at 04/15/20 1438  . fluticasone (FLOVENT HFA) 110 MCG/ACT inhaler 1 puff  1 puff Inhalation BID Cristofano, Worthy Rancher, MD   1 puff at 04/15/20 0800  . gabapentin (NEURONTIN) capsule 100 mg  100 mg Oral PRN Lynnell Catalan, MD      . hydrOXYzine (ATARAX/VISTARIL) tablet 25 mg  25 mg Oral Q8H PRN Agarwala, Ravi, MD      . hydrOXYzine (ATARAX/VISTARIL) tablet 25 mg  25 mg Oral TID PRN Aldean Baker, NP   25 mg at 04/15/20 0932  . lamoTRIgine (LAMICTAL) tablet 100 mg  100 mg Oral Daily Agarwala, Daleen Bo, MD   100 mg at 04/15/20 0800  . lidocaine (XYLOCAINE) 2 % viscous mouth solution 15 mL  15 mL Mouth/Throat Q4H PRN Antonieta Pert, MD      . loperamide (IMODIUM) capsule 2-4 mg  2-4 mg Oral PRN Aldean Baker, NP      . LORazepam (ATIVAN) tablet 1 mg  1 mg Oral Q6H  PRN Aldean Baker, NP      . magnesium hydroxide (MILK OF MAGNESIA) suspension 30 mL  30 mL Oral Daily PRN Aldean Baker, NP      . multivitamin with minerals tablet 1 tablet  1 tablet Oral  Daily Aldean Baker, NP   1 tablet at 04/15/20 0800  . Norgestimate-Ethinyl Estradiol Triphasic 0.18/0.215/0.25 MG-25 MCG tablet 1 tablet  1 tablet Oral QHS Agarwala, Ravi, MD      . ondansetron (ZOFRAN-ODT) disintegrating tablet 4 mg  4 mg Oral Q6H PRN Aldean Baker, NP      . phenazopyridine (PYRIDIUM) tablet 100 mg  100 mg Oral TID WC Antonieta Pert, MD   100 mg at 04/14/20 1700  . polyethylene glycol (MIRALAX / GLYCOLAX) packet 17 g  17 g Oral Daily PRN Antonieta Pert, MD      . thiamine tablet 100 mg  100 mg Oral Daily Aldean Baker, NP   100 mg at 04/15/20 0802  . traZODone (DESYREL) tablet 50 mg  50 mg Oral QHS PRN Aldean Baker, NP        Lab Results: No results found for this or any previous visit (from the past 48 hour(s)).  Blood Alcohol level:  Lab Results  Component Value Date   ETH 35 (H) 04/09/2020   ETH <10 04/19/2018    Metabolic Disorder Labs: Lab Results  Component Value Date   HGBA1C 5.1 04/13/2020   MPG 99.67 04/13/2020   Lab Results  Component Value Date   PROLACTIN 33.8 (H) 04/09/2020   Lab Results  Component Value Date   CHOL 180 04/13/2020   TRIG 134 04/13/2020   HDL 66 04/13/2020   CHOLHDL 2.7 04/13/2020   VLDL 27 04/13/2020   LDLCALC 87 04/13/2020    Physical Findings: AIMS:  , ,  ,  ,    CIWA:  CIWA-Ar Total: 0 COWS:     Musculoskeletal: Strength & Muscle Tone: within normal limits Gait & Station: normal Patient leans: N/A  Psychiatric Specialty Exam: Physical Exam Vitals and nursing note reviewed.  Constitutional:      Appearance: Normal appearance.  HENT:     Head: Normocephalic and atraumatic.  Pulmonary:     Effort: Pulmonary effort is normal.  Neurological:     General: No focal deficit present.     Mental Status: She is  alert and oriented to person, place, and time.     Review of Systems  Blood pressure 116/71, pulse (!) 108, temperature 98.7 F (37.1 C), temperature source Oral, resp. rate 18, height 5\' 6"  (1.676 m), weight 47.2 kg, last menstrual period 04/12/2020, SpO2 97 %.Body mass index is 16.79 kg/m.  General Appearance: Casual  Eye Contact:  Fair  Speech:  Normal Rate  Volume:  Normal  Mood:  Euthymic  Affect:  Congruent  Thought Process:  Coherent and Descriptions of Associations: Intact  Orientation:  Full (Time, Place, and Person)  Thought Content:  Logical  Suicidal Thoughts:  No  Homicidal Thoughts:  No  Memory:  Immediate;   Fair Recent;   Good Remote;   Fair  Judgement:  Intact  Insight:  Fair  Psychomotor Activity:  Normal  Concentration:  Concentration: Fair and Attention Span: Fair  Recall:  04/14/2020 of Knowledge:  Good  Language:  Good  Akathisia:  Negative  Handed:  Right  AIMS (if indicated):     Assets:  Desire for Improvement Housing Resilience Social Support  ADL's:  Intact  Cognition:  WNL  Sleep:  Number of Hours: 6.25     Treatment Plan Summary: Daily contact with patient to assess and evaluate symptoms and progress in treatment, Medication management and Plan : Patient is seen and examined.  Patient is a 19 year old female with the above-stated past psychiatric history who is seen in follow-up.  Diagnosis: 1.  Major depression, recurrent, severe without psychotic features versus bipolar disorder, most recently depressed 2.  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  Posttraumatic stress disorder. 4. Recent seizure most likely secondary to medication overdose 5. Alcohol use disorder 6.  Marijuana use disorder  Pertinent findings on examination today: 1.  Denies suicidal or homicidal ideation. 2.  No evidence of psychosis. 3.  Improved interstitial cystitis/pelvic floor issues. 4.  No complaints today about constipation. 5.  Improved  sleep  Plan: 1.  Continue albuterol 1 to 2 puffs as needed wheezing or shortness of breath. 2.  Dulcolax suppository 10 mg PR daily as needed constipation. 3.  Continue diazepam 5 mg p.o. every 8 hours as needed pelvic floor pain. 4.  Continue Pepcid 20 mg p.o. twice daily for gastric protection. 5.  Continue gabapentin 100 mg p.o. 3 times daily as needed anxiety. 6.  Continue Vistaril 25 mg p.o. 3 times daily as needed anxiety. 7.  Continue Lamictal 100 mg p.o. daily for mood stability. 8.  Start Pyridium 100 mg p.o. 3 times daily with food for urinary discomfort. 9.  Continue Zofran 4 mg p.o. every 6 hours as needed nausea and vomiting. 10.  Continue oral contraceptives. 11.    Continue MiraLAX 17 g in 8 ounces of water as needed constipation. 12.  Continue thiamine 100 mg p.o. daily for nutritional supplementation. 13.    Continue lidocaine viscous mouth solution 15 mL for throat discomfort. 14.    Continue trazodone to 100 mg p.o. nightly as needed insomnia. 15.  Disposition planning-in progress. Antonieta PertGreg Lawson Kristalyn Bergstresser, MD 04/15/2020, 2:41 PM

## 2020-04-16 DIAGNOSIS — F332 Major depressive disorder, recurrent severe without psychotic features: Secondary | ICD-10-CM

## 2020-04-16 MED ORDER — LAMOTRIGINE 100 MG PO TABS: 100.0000 mg | ORAL_TABLET | Freq: Every day | ORAL | 0 refills | Status: AC

## 2020-04-16 MED ORDER — GABAPENTIN 100 MG PO CAPS: 100.0000 mg | ORAL_CAPSULE | ORAL | 0 refills | Status: AC | PRN

## 2020-04-16 MED ORDER — DIAZEPAM 5 MG PO TABS: 5.0000 mg | ORAL_TABLET | Freq: Three times a day (TID) | ORAL | 0 refills | Status: AC

## 2020-04-16 MED ORDER — POLYETHYLENE GLYCOL 3350 17 G PO PACK: 17.0000 g | PACK | Freq: Every day | ORAL | 0 refills | Status: AC | PRN

## 2020-04-16 MED ORDER — FAMOTIDINE 20 MG PO TABS: 20.0000 mg | ORAL_TABLET | Freq: Two times a day (BID) | ORAL | 0 refills | Status: DC

## 2020-04-16 MED ORDER — HYDROXYZINE HCL 25 MG PO TABS: 25.0000 mg | ORAL_TABLET | Freq: Three times a day (TID) | ORAL | 0 refills | Status: AC | PRN

## 2020-04-16 NOTE — Progress Notes (Signed)
   04/16/20 0900  Psych Admission Type (Psych Patients Only)  Admission Status Involuntary  Psychosocial Assessment  Patient Complaints None  Eye Contact Fair  Facial Expression Anxious  Affect Anxious;Preoccupied  Speech Logical/coherent  Interaction Assertive  Motor Activity Fidgety  Appearance/Hygiene Unremarkable  Behavior Characteristics Cooperative  Mood Pleasant  Thought Process  Coherency Concrete thinking  Content WDL  Delusions None reported or observed  Perception WDL  Hallucination None reported or observed  Judgment Impaired  Confusion None  Danger to Self  Current suicidal ideation? Denies  Danger to Others  Danger to Others None reported or observed

## 2020-04-16 NOTE — Progress Notes (Signed)
   04/15/20 2208  Psych Admission Type (Psych Patients Only)  Admission Status Involuntary  Psychosocial Assessment  Patient Complaints None  Eye Contact Fair  Facial Expression Animated;Anxious  Affect Anxious;Preoccupied  Speech Press photographer  Appearance/Hygiene Unremarkable  Behavior Characteristics Cooperative;Calm  Mood Pleasant  Thought Process  Coherency Concrete thinking  Content Preoccupation;Paranoia  Delusions None reported or observed  Perception WDL  Hallucination None reported or observed  Judgment Impaired  Confusion None  Danger to Self  Current suicidal ideation? Denies  Danger to Others  Danger to Others None reported or observed

## 2020-04-16 NOTE — Plan of Care (Signed)
Nurse discussed anxiety, depression and coping skills with patient.  

## 2020-04-16 NOTE — [facility] Group Notes (Signed)
Adult Psychoeducational Group Not Date:  04/16/2020 Time:  0900-1045 Group Topic/Focus: PROGRESSIVE RELAXATION. A group where deep breathing is taught and tensing and relaxation muscle groups is used. Imagery is used as well.  Pts are asked to imagine 3 pillars that hold them up when they are not able to hold themselves up.  Participation Level:  Active  Participation Quality:  Appropriate  Affect:  Appropriate  Cognitive:  Oriented  Insight: Improving  Engagement in Group:  Engaged  Modes of Intervention:  Activity, Discussion, Education, and Support  Additional Comments:  Pt rates her energy level at a 6/10. States that her sister, her dog and her best friend are her support systems and what holds her up when she is not able to do it for herself   Dione Housekeeper 04/16/2020`

## 2020-04-16 NOTE — BHH Group Notes (Signed)
The focus of this group is to help patients establish daily goals to achieve during treatment and discuss how the patient can incorporate goal setting into their daily lives to aide in recovery.   Patient attended group and contributed to group. 

## 2020-04-16 NOTE — Progress Notes (Signed)
[place] NOVEL CORONAVIRUS (COVID-19) DAILY CHECK-OFF SYMPTOMS - answer yes or no to each - every day NO YES  Have you had a fever in the past 24 hours?  . Fever (Temp > 37.80C / 100F) X   Have you had any of these symptoms in the past 24 hours? . New Cough .  Sore Throat  .  Shortness of Breath .  Difficulty Breathing .  Unexplained Body Aches   X   Have you had any one of these symptoms in the past 24 hours not related to allergies?   . Runny Nose .  Nasal Congestion .  Sneezing   X   If you have had runny nose, nasal congestion, sneezing in the past 24 hours, has it worsened?  X   EXPOSURES - check yes or no X   Have you traveled outside the state in the past 14 days?  X   Have you been in contact with someone with a confirmed diagnosis of COVID-19 or PUI in the past 14 days without wearing appropriate PPE?  X   Have you been living in the same home as a person with confirmed diagnosis of COVID-19 or a PUI (household contact)?    X   Have you been diagnosed with COVID-19?    X              What to do next: Answered NO to all: Answered YES to anything:   Proceed with unit schedule Follow the [facility] Inpatient Flowsheet.   

## 2020-04-16 NOTE — Progress Notes (Signed)
Discharge Note:  Patient discharged home with her dad.  Patient denied SI and HI, contracts for safety.  Denied A/V hallucinations.  Denied pain.  Suicide prevention information given and discussed with patient who stated she understood and had no questions.  Patient stated she received all her belongings, clothing, toiletries, misc items, etc.  Patient stated she appreciated all assistance received from [facility] staff.  All required discharge information given to patient.

## 2020-04-16 NOTE — [facility] Suicide Risk Assessment (Signed)
[facility] INPATIENT:  Family/Significant Other Suicide Prevention Education  Suicide Prevention Education:  Education Completed;  father Nida Boatman [phone]) ,  (name of family member/significant other) has been identified by the patient as the family member/significant other with whom the patient will be residing, and identified as the person(s) who will aid the patient in the event of a mental health crisis (suicidal ideations/suicide attempt).  With written consent from the patient, the family member/significant other has been provided the following suicide prevention education, prior to the and/or following the discharge of the patient.  The suicide prevention education provided includes the following:  Suicide risk factors  Suicide prevention and interventions  National Suicide Hotline telephone number  [facility] assessment telephone number  [facility] Emergency Assistance 911  [facility] and/or Residential Mobile Crisis Unit telephone number  Request made of family/significant other to:  Remove weapons (e.g., guns, rifles, knives), all items previously/currently identified as safety concern.    Remove drugs/medications (over-the-counter, prescriptions, illicit drugs), all items previously/currently identified as a safety concern.  The family member/significant other verbalizes understanding of the suicide prevention education information provided.  The family member/significant other agrees to remove the items of safety concern listed above.  Carloyn Jaeger Grossman-Orr 04/16/2020, 9:54 AM

## 2020-04-16 NOTE — Progress Notes (Signed)
  [facility] Adult Case Management Discharge Plan :  Will you be returning to the same living situation after discharge:  No.  Is going to stay with sister At discharge, do you have transportation home?: Yes,  sister Do you have the ability to pay for your medications: Yes,  Medicaid  Release of information consent forms completed and emailed to Medical Records, then turned in to Medical Records by CSW.   Patient to Follow up at:  Follow-up Information    [place] [facility]. Go on 04/18/2020.   Specialty: Behavioral Health Why: You have a walk in appointment on  04/18/20 at 7:45 am for therapy services. Walk in appointments are first come, first served. You also have a VIRTUAL appointment for medication management on 05/12/20 at 8:30 am. Contact information: [address] [place] [address] [phone]       Durel Salts. Call on 04/18/2020.   Why: Appointment is scheduled for 04/18/20 at 12:15PM.  Contact information: [address] [place], [place] [address]  P: [phone]       Alphonza Faison Jr,RN,FNP. Call on 04/17/2020.   Why: You have a VIRTUAL appointment for medication management with Mr. Emilio Aspen on Monday, November 2nd at [facility]. He was provided your e-mail address and phone number. You should receive a link with instructions (via [email or web address]) for your scheduled visit.  Contact information: [address] [place], [place] [address] Phone Number: [phone] Fax Number: [phone]              Next level of care provider has access to [facility] Link:no  Safety Planning and Suicide Prevention discussed: Yes,  with father Nida Boatman [phone])   Have you used any form of tobacco in the last 30 days? (Cigarettes, Smokeless Tobacco, Cigars, and/or Pipes): No  Has patient been referred to the Quitline?: N/A patient is not a smoker  Patient has been referred for addiction treatment: N/A  Lynnell Chad, LCSW 04/16/2020, 9:47 AM

## 2020-04-16 NOTE — Discharge Summary (Signed)
Physician Discharge Summary Note  Patient:  Alexis [name] is an 19 y.o., female MRN:  [id number] DOB:  2001-01-21 Patient phone:  [phone] (home)  Patient address:   [address] [place] [address],  Total Time spent with patient: 35 minutes  Date of Admission:  04/12/2020 Date of Discharge: 04/16/2020  Reason for Admission:  Suicide attempt  Principal Problem: MDD (major depressive disorder), recurrent severe, without psychosis ([place]) Discharge Diagnoses: Principal Problem:   MDD (major depressive disorder), recurrent severe, without psychosis ([place])   Past Psychiatric History: depression, anxiety  Past Medical History:  Past Medical History:  Diagnosis Date  . Anxiety   . Asthma   . Bipolar disorder ([place])    per pt  . Depression   . GERD (gastroesophageal reflux disease)   . Migraines     Past Surgical History:  Procedure Laterality Date  . CYSTO WITH HYDRODISTENSION N/A 04/10/2018   Procedure: CYSTOSCOPY/HYDRODISTENSION;  Surgeon: Ardis Hughs, MD;  Location: [facility];  Service: Urology;  Laterality: N/A;  . UPPER GI ENDOSCOPY     Family History:  Family History  Problem Relation Age of Onset  . Asthma Mother   . Thyroid disease Mother   . Healthy Sister    Family Psychiatric  History: none Social History:  Social History   Substance and Sexual Activity  Alcohol Use Yes  . Alcohol/week: 4.0 standard drinks  . Types: 4 Standard drinks or equivalent per week   Comment: socially     Social History   Substance and Sexual Activity  Drug Use Never    Social History   Socioeconomic History  . Marital status: Single    Spouse name: Not on file  . Number of children: 0  . Years of education: 83  . Highest education level: Some college, no degree  Occupational History  . Not on file  Tobacco Use  . Smoking status: Never Smoker  . Smokeless tobacco: Never Used  Vaping Use  . Vaping Use: Never used  Substance and Sexual  Activity  . Alcohol use: Yes    Alcohol/week: 4.0 standard drinks    Types: 4 Standard drinks or equivalent per week    Comment: socially  . Drug use: Never  . Sexual activity: Yes    Birth control/protection: Pill  Other Topics Concern  . Not on file  Social History Narrative  . Not on file   Social Determinants of Health   Financial Resource Strain:   . Difficulty of Paying Living Expenses: Not on file  Food Insecurity:   . Worried About Charity fundraiser in the Last Year: Not on file  . Ran Out of Food in the Last Year: Not on file  Transportation Needs:   . Lack of Transportation (Medical): Not on file  . Lack of Transportation (Non-Medical): Not on file  Physical Activity:   . Days of Exercise per Week: Not on file  . Minutes of Exercise per Session: Not on file  Stress:   . Feeling of Stress : Not on file  Social Connections:   . Frequency of Communication with Friends and Family: Not on file  . Frequency of Social Gatherings with Friends and Family: Not on file  . Attends Religious Services: Not on file  . Active Member of Clubs or Organizations: Not on file  . Attends Archivist Meetings: Not on file  . Marital Status: Not on file    Hospital Course:  On admission 11/18: Patient is seen and examined. Patient is a 19 year old female with a reported past psychiatric history significant for bipolar disorder who originally presented to the [facility] emergency department on 04/09/2020 after an intentional overdose. She was brought to the hospital by emergency services. She had been found by her sister and her to patient's apartment. She was unresponsive with empty bottles of gabapentin at the bedside. Her blood alcohol on admission was 35. She was reportedly combative on admission but nonverbal when she arrived in the emergency department, and was intubated. Neurology consultation was obtained. Diagnosis was encephalopathy that was  multifactorial. 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 Keppra was started for seizure protection. She underwent an EEG that showed slow continuous theta-delta slowing. It was suggestive of severe diffuse encephalopathy. No seizures or epileptiform discharges were noted. After extubation she was evaluated by the psychiatric consultation service. The patient denied taking gabapentin however medical team feels as though she did overdose on gabapentin as well as Lamictal. There were 2 empty bottles found at her bedside after the overdose. At that time she denied any recent stressors or triggers. She did admit to impulsive behaviors and poor impulse control. She also reported 5 suicide attempts in the past. Recommendation was for inpatient treatment. On examination today the patient denied suicidal ideation. She stated that she had recently started working at [employer], and that they had "started yelling at me". She stated that was the only stressor that she could recall. She stated that she is followed at [facility]. She stated she had been diagnosed with bipolar disorder in the past. She admitted to episodes of several days at a time without getting tired, driving around [facility] for days at a time, as well as euphoria and irritability and impulsivity. Her medications in the intensive care unit included diazepam as needed, gabapentin as needed anxiety, lamotrigine 100 mg p.o. daily, hydroxyzine, Ativan, birth control. She was also placed on Zoloft, but she stated that Zoloft made her feel suicidal and wanted that stopped. Her last psychiatric hospitalization at our facility was in November 2019. She presented to the [facility] emergency department at that time after an intentional overdose of hydroxyzine and diazepam. She is on the diazepam secondary to  interstitial cystitis as well as pelvic floor abnormality. On review of her previous medication she denied having previously been treated with lithium, Depakote, Seroquel, Zyprexa, Abilify or other mood stabilizers. She stated that she did not want to gain weight. Her discharge medications at that time included albuterol, diazepam, Pepcid, hydroxyzine, meloxicam, oral contraceptives, Pyridium, sertraline and tramadol. She denied current suicidality. She was admitted to our facility for continued treatment and evaluation.  Medications:   Started CIWA protocol, increase in gabapentin 100 mg daily PRN to 100 mg TID, Ativan detox protocol continued Lamictal 100 mg daily  11/19: Patient is a 19 year old female with a reported past psychiatric history significant for bipolar disorder who originally presented to the [facility] emergency department on 04/09/2020 after an intentional overdose of gabapentin as well as lamotrigine.  Objective: Patient is seen and examined.  Patient is a 19 year old female with the above-stated past psychiatric history who is seen in follow-up.  She continues to minimize her symptoms.  Her primary concern today is her pelvic floor problem.  She is having some problems with her interstitial cystitis and requested Pyridium.  She also requested something for constipation.  We discussed options including MiraLAX and Dulcolax suppositories.  She also stated she was having some throat irritation from the previous intubation that was required.  She denies suicidal or homicidal ideation.  We discussed the seriousness of her attempt.  We discussed my concern for her safety.  She has already told the nursing staff, and requested of me to be discharged in the next day or so.  We discussed her previous psychiatric follow-up.  She stated she wanted to get a new psychiatrist, and that her therapist had a psychiatrist in their office.  Unfortunately he does not remember the  name of the therapist nor a phone number.  She has attempted to contact her sister to find out some information that we could arrange follow-up.  I told her that most likely if we were unable to get that information and contact them that she would not be able to be discharged at least until Monday.  She stated she would try and assist with that.  Her pulse is elevated today at 118.  Her blood pressure stable.  She is afebrile.  Nursing notes show she only slept 4.25 hours last night.  Her laboratories from 11/18 showed normal electrolytes including creatinine and liver function enzymes.  Lipid panel was normal.  CBC was normal.  Hemoglobin A1c was 5.1.  TSH was 1.017.  Medications: Increased Trazodone 50 mg PRN at bedtime to 100 mg daily, started Pyridium 100 mg TID for urinary discomfort, continue other medicaitons  11/20:  Patient is a 19 year old female with a reported past psychiatric history significant for bipolar disorder who originally presented to the [facility] emergency department on 04/09/2020 after an intentional overdose of gabapentin as well as lamotrigine.  Objective: Patient is seen and examined.  Patient is a 19 year old female with the above-stated past psychiatric history who is seen in follow-up.  She is a bit irritable today.  She is unhappy about the food, and also her preferred Ensure was not available.  She denied any suicidal or homicidal ideation.  We discussed today the significance and seriousness of her overdose.  She had wanted to go home today, and we discussed the fact that I was trying to make sure she was stable over more than just a couple of days.  She was able to make contact with her therapist and talk to him twice yesterday.  That made her feel better.  We discussed the fact that she is probably going to be discharged tomorrow.  She was happy about that.  She denied any side effects to her current medications.  She had had some mild tachycardia  between 108 113.  Her blood pressure stable.  She slept 6.25 hours last night.  No new laboratories.   No medication changes  11/21: Patient has met maximum benefit of hospitalization. No suicidal/homicidal ideations, hallucinations, or substance issues. Discharge instructions provided with explanations along with Rx, follow up appointment, and crisis numbers. Psychiatrically stable for discharge.  Musculoskeletal: Strength & Muscle Tone: within normal limits Gait & Station: normal Patient leans: N/A  Psychiatric Specialty Exam: Physical Exam Vitals and nursing note reviewed.  Constitutional:      Appearance: Normal appearance.  HENT:     Head: Normocephalic.     Nose: Nose normal.  Pulmonary:     Effort: Pulmonary effort is normal.  Musculoskeletal:        General: Normal range of motion.     Cervical back:  Normal range of motion.  Neurological:     General: No focal deficit present.     Mental Status: She is alert and oriented to person, place, and time.  Psychiatric:        Attention and Perception: Attention and perception normal.        Mood and Affect: Mood is anxious.        Speech: Speech normal.        Behavior: Behavior normal. Behavior is cooperative.        Thought Content: Thought content normal.        Cognition and Memory: Cognition and memory normal.        Judgment: Judgment normal.     Review of Systems  Psychiatric/Behavioral: The patient is nervous/anxious.   All other systems reviewed and are negative.   Blood pressure (!) 111/57, pulse (!) 129, temperature 98.9 F (37.2 C), temperature source Oral, resp. rate 18, height _0  (1.676 m), weight 47.2 kg, last menstrual period 04/12/2020, SpO2 97 %.Body mass index is 16.79 kg/m.  General Appearance: Casual  Eye Contact:  Good  Speech:  Normal Rate  Volume:  Normal  Mood:  Anxious  Affect:  Congruent  Thought Process:  Coherent and Descriptions of Associations: Intact  Orientation:  Full  (Time, Place, and Person)  Thought Content:  WDL and Logical  Suicidal Thoughts:  No  Homicidal Thoughts:  No  Memory:  Immediate;   Good Recent;   Good Remote;   Good  Judgement:  Fair  Insight:  Fair  Psychomotor Activity:  Normal  Concentration:  Concentration: Good and Attention Span: Good  Recall:  Good  Fund of Knowledge:  Good  Language:  Good  Akathisia:  No  Handed:  Right  AIMS (if indicated):     Assets:  Leisure Time Physical Health Resilience Social Support  ADL's:  Intact  Cognition:  WNL  Sleep:  Number of Hours: 6.25     Have you used any form of tobacco in the last 30 days? (Cigarettes, Smokeless Tobacco, Cigars, and/or Pipes): No  Has this patient used any form of tobacco in the last 30 days? (Cigarettes, Smokeless Tobacco, Cigars, and/or Pipes) Yes, Yes, A prescription for an FDA-approved tobacco cessation medication was offered at discharge and the patient refused  Blood Alcohol level:  Lab Results  Component Value Date   ETH 35 (H) 04/09/2020   ETH <10 95/62/1308    Metabolic Disorder Labs:  Lab Results  Component Value Date   HGBA1C 5.1 04/13/2020   MPG 99.67 04/13/2020   Lab Results  Component Value Date   PROLACTIN 33.8 (H) 04/09/2020   Lab Results  Component Value Date   CHOL 180 04/13/2020   TRIG 134 04/13/2020   HDL 66 04/13/2020   CHOLHDL 2.7 04/13/2020   VLDL 27 04/13/2020   LDLCALC 87 04/13/2020    See Psychiatric Specialty Exam and Suicide Risk Assessment completed by Attending Physician prior to discharge.  Discharge destination:  Home  Is patient on multiple antipsychotic therapies at discharge:  No   Has Patient had three or more failed trials of antipsychotic monotherapy by history:  No  Recommended Plan for Multiple Antipsychotic Therapies: NA  Discharge Instructions    Diet - low sodium heart healthy   Complete by: As directed    Increase activity slowly   Complete by: As directed    Increase activity slowly    Complete by: As directed      Allergies  as of 04/16/2020      Reactions   Other Other (See Comments)   All acidic foods cause extreme bladder pain   Tomato Other (See Comments)   Causes burning and bladder issues       Medication List    TAKE these medications     Indication  albuterol 108 (90 Base) MCG/ACT inhaler Commonly known as: VENTOLIN HFA Inhale 1-2 puffs into the lungs every 6 (six) hours as needed for wheezing or shortness of breath.  Indication: Asthma   diazepam 5 MG tablet Commonly known as: VALIUM Take 1 tablet (5 mg total) by mouth every 8 (eight) hours.  Indication: pelic floor dysfunction   famotidine 20 MG tablet Commonly known as: PEPCID Take 1 tablet (20 mg total) by mouth 2 (two) times daily.  Indication: Heartburn   Flovent HFA 110 MCG/ACT inhaler Generic drug: fluticasone Inhale 1 puff into the lungs 2 (two) times daily.  Indication: Asthma   gabapentin 100 MG capsule Commonly known as: NEURONTIN Take 1 capsule (100 mg total) by mouth as needed (anxiety).  Indication: Social Anxiety Disorder   hydrOXYzine 25 MG tablet Commonly known as: ATARAX/VISTARIL Take 1 tablet (25 mg total) by mouth every 8 (eight) hours as needed for anxiety.  Indication: Feeling Anxious   lamoTRIgine 100 MG tablet Commonly known as: LAMICTAL Take 1 tablet (100 mg total) by mouth daily.  Indication: Manic-Depression   Norgestimate-Ethinyl Estradiol Triphasic 0.18/0.215/0.25 MG-25 MCG tab Commonly known as: Tri-Lo-Marzia Take 1 tablet by mouth at bedtime. Birth control method  Indication: Birth Control Treatment   phenazopyridine 100 MG tablet Commonly known as: PYRIDIUM Take 1 tablet (100 mg total) by mouth 3 (three) times daily. For bladder spasms What changed:   when to take this  reasons to take this  additional instructions  Another medication with the same name was removed. Continue taking this medication, and follow the directions you see here.   Indication: Bladder Lining Irritation   polyethylene glycol 17 g packet Commonly known as: MIRALAX / GLYCOLAX Take 17 g by mouth daily as needed for mild constipation.  Indication: Constipation       Follow-up Information    [place]. Go on 04/18/2020.   Specialty: Behavioral Health Why: You have a walk in appointment on  04/18/20 at 7:45 am for therapy services. Walk in appointments are first come, first served. You also have a VIRTUAL appointment for medication management on 05/12/20 at 8:30 am. Contact information: [place]       Terance Ice. Call on 04/18/2020.   Why: Appointment is scheduled for 04/18/20 at 12:15PM.  Contact information: [address] [place], [place] [address]  P: [phone]       Alphonza Faison Jr,RN,FNP. Call on 04/17/2020.   Why: You have a VIRTUAL appointment for medication management with Mr. Ammie Ferrier on Monday, November 2nd at [facility]. He was provided your e-mail address and phone number. You should receive a link with instructions (via [email or web address]) for your scheduled visit.  Contact information: [address] [place], [place] [address] Phone Number: [phone] Fax Number: [phone]              Follow-up recommendations:  Activity:  as tolerated Diet:  heart healthy diet  Comments:  Follow up with appointment above  Signed: Waylan Boga, NP 04/16/2020, 9:31 AM

## 2020-04-16 NOTE — [facility] Group Notes (Signed)
Patient had left before group therapy started.  Ambrose Mantle, LCSW 04/16/2020, 12:03 PM

## 2020-04-16 NOTE — B[facility] Suicide Risk Assessment (Signed)
[facility] Discharge Suicide Risk Assessment   Principal Problem: <principal problem not specified> Discharge Diagnoses: Active Problems:   MDD (major depressive disorder), recurrent episode, severe ([facility])   Total Time spent with patient: 20 minutes  Musculoskeletal: Strength & Muscle Tone: within normal limits Gait & Station: normal Patient leans: N/A  Psychiatric Specialty Exam: Review of Systems  All other systems reviewed and are negative.   Blood pressure (!) 111/57, pulse (!) 129, temperature 98.9 F (37.2 C), temperature source Oral, resp. rate 18, height 5\' 6"  (1.676 m), weight 47.2 kg, last menstrual period 04/12/2020, SpO2 97 %.Body mass index is 16.79 kg/m.  General Appearance: Casual  Eye Contact::  Fair  Speech:  Normal Rate409  Volume:  Normal  Mood:  Euthymic  Affect:  Congruent  Thought Process:  Coherent and Descriptions of Associations: Intact  Orientation:  Full (Time, Place, and Person)  Thought Content:  Logical  Suicidal Thoughts:  No  Homicidal Thoughts:  No  Memory:  Immediate;   Fair Recent;   Fair Remote;   Fair  Judgement:  Intact  Insight:  Fair  Psychomotor Activity:  Normal  Concentration:  Good  Recall:  Good  Fund of Knowledge:Good  Language: Good  Akathisia:  Negative  Handed:  Right  AIMS (if indicated):     Assets:  Desire for Improvement Housing Resilience Social Support  Sleep:  Number of Hours: 6.25  Cognition: WNL  ADL's:  Intact   Mental Status Per Nursing Assessment::   On Admission:  NA  Demographic Factors:  Caucasian and Low socioeconomic status  Loss Factors: NA  Historical Factors: Impulsivity  Risk Reduction Factors:   Positive social support and Positive therapeutic relationship  Continued Clinical Symptoms:  Depression:   Impulsivity Personality Disorders:   Cluster B More than one psychiatric diagnosis Medical Diagnoses and Treatments/Surgeries  Cognitive Features That Contribute To Risk:  None     Suicide Risk:  Minimal: No identifiable suicidal ideation.  Patients presenting with no risk factors but with morbid ruminations; may be classified as minimal risk based on the severity of the depressive symptoms   Follow-up Information    [facility]. Go on 04/18/2020.   Specialty: Behavioral Health Why: You have a walk in appointment on  04/18/20 at 7:45 am for therapy services. Walk in appointments are first come, first served. You also have a VIRTUAL appointment for medication management on 05/12/20 at 8:30 am. Contact information: [address] [place] [phone]       [phone]. Call on 04/18/2020.   Why: Appointment is scheduled for 04/18/20 at 12:15PM.  Contact information: [address] [place], [place]  P: [phone]       Alphonza Faison Jr,RN,FNP. Call on 04/17/2020.   Why: You have a VIRTUAL appointment for medication management with Mr. 04/19/2020 on Monday, November 2nd at [facility]. He was provided your e-mail address and phone number. You should receive a link with instructions (via [email or web address]) for your scheduled visit.  Contact information: [address] [place], [place] Phone Number: [phone] Fax Number: [phone]              Plan Of Care/Follow-up recommendations:  Activity:  ad lib  [phone], MD 04/16/2020, 7:48 AM

## 2020-04-16 NOTE — Discharge Instructions (Signed)
Follow up with out patient provider

## 2020-04-27 ENCOUNTER — Encounter: Payer: Self-pay | Admitting: Physical Therapy

## 2020-04-27 ENCOUNTER — Other Ambulatory Visit: Payer: Self-pay

## 2020-04-27 ENCOUNTER — Ambulatory Visit: Payer: Medicaid Other | Attending: Urology | Admitting: Physical Therapy

## 2020-04-27 DIAGNOSIS — R252 Cramp and spasm: Secondary | ICD-10-CM | POA: Diagnosis present

## 2020-04-27 DIAGNOSIS — M6281 Muscle weakness (generalized): Secondary | ICD-10-CM | POA: Diagnosis not present

## 2020-04-27 DIAGNOSIS — R293 Abnormal posture: Secondary | ICD-10-CM | POA: Insufficient documentation

## 2020-04-27 NOTE — Therapy (Signed)
[facility] [address] [place], [place], [address] Phone: [phone]   Fax:  [phone]  Physical Therapy Treatment  Patient Details  Name: Alexis [name] MRN: [id number] Date of Birth: 2001-03-08 Referring Provider (PT): Burman Foster, NP   Encounter Date: 04/27/2020   PT End of Session - 04/27/20 1016    Visit Number 5    Date for PT Re-Evaluation 05/25/20    Authorization Type Medicaid    Authorization Time Period 04/13/2020-05/24/2020    Authorization - Visit Number 1    Authorization - Number of Visits 6    PT Start Time 0930    PT Stop Time 1015    PT Time Calculation (min) 45 min    Activity Tolerance Patient tolerated treatment well;Patient limited by fatigue    Behavior During Therapy [facility] for tasks assessed/performed           Past Medical History:  Diagnosis Date  . Anxiety   . Asthma   . Bipolar disorder (HCC)    per pt  . Depression   . GERD (gastroesophageal reflux disease)   . Migraines     Past Surgical History:  Procedure Laterality Date  . CYSTO WITH HYDRODISTENSION N/A 04/10/2018   Procedure: CYSTOSCOPY/HYDRODISTENSION;  Surgeon: Crist Fat, MD;  Location: [facility];  Service: Urology;  Laterality: N/A;  . UPPER GI ENDOSCOPY      There were no vitals filed for this visit.   Subjective Assessment - 04/27/20 0934    Subjective My bladder has been acting up. I have burning 8/10, urgency at night. I wake up 6-8 times at night to urinate but try to deter it.    Patient Stated Goals work on back pain and eliminate pain with sex, reduce burning    Currently in Pain? Yes    Pain Score 8     Pain Location Bladder    Pain Orientation Mid    Pain Descriptors / Indicators Burning    Pain Type Acute pain    Pain Onset More than a month ago    Pain Frequency Intermittent    Aggravating Factors  food for burning, urgency for the past week due to stress     Pain Relieving Factors not urination    Multiple Pain Sites Yes    Pain Score 4    Pain Location Back    Pain Orientation Mid;Upper    Pain Descriptors / Indicators Dull    Pain Type Acute pain    Pain Onset More than a month ago    Pain Frequency Constant    Aggravating Factors  not exercising, lifting things at [employer], sitting long period of time, not having correct posture    Pain Relieving Factors stretched, heat, bath, correct posture                          Pelvic Floor Special Questions - 04/27/20 0001    Pelvic Floor Internal Exam Patient confirms identification and approves PT to assess pelvic floor and treatment    Exam Type Vaginal             [facility] Adult PT Treatment/Exercise - 04/27/20 0001      Manual Therapy   Manual Therapy Myofascial release;Internal Pelvic Floor    Manual therapy comments to assess for dry needling    Soft tissue mobilization to elongate the thoracic paraspinal after dry needling  Myofascial Release release of the urogenital diaphgram going through all the layers of fascial and increased mobility of the tissue    Internal Pelvic Floor fascial release to the right levator ani, perineal body, and bilateral sides of the urethra sphincter and bladder while monitoring pain            Trigger Point Dry Needling - 04/27/20 0001    Consent Given? Yes    Education Handout Provided Previously provided    Muscles Treated Back/Hip Thoracic multifidi   T1-T5 bil.    Thoracic multifidi response Twitch response elicited;Palpable increased muscle length                  PT Short Term Goals - 03/23/20 1018      PT SHORT TERM GOAL #1   Title independent with initial HEP    Time 4    Period Weeks    Status Achieved             PT Long Term Goals - 04/27/20 1026      PT LONG TERM GOAL #1   Title independent with HEP    Baseline still learnging as she progresses in therapy    Time 12    Period Weeks    Status  On-going      PT LONG TERM GOAL #2   Title improved posture to reduce the thoracic kyphosis so she has reduction in back pain to </= 1/10    Baseline pain level 5/10; working on it    Time 12    Period Weeks    Status On-going      PT LONG TERM GOAL #3   Title burning with urination decreased to </= 1/10 due to improved vaginal health and elongation of tissue    Baseline pain level is 8/10    Time 12    Period Weeks    Status On-going      PT LONG TERM GOAL #4   Title pain with penile penetraction decreased </= 1/10 due to improved elongation of the pelvic floor muscles    Baseline pain level is 3/10    Time 12    Period Weeks    Status On-going                 Plan - 04/27/20 1017    Clinical Impression Statement Patient has not been in therapy due to being admitted into the hospital for mental health. Patient is doing her HEP consistently. She had 8/10 initially internally but after gentle fascial work the pain level decreased to 1/10. Patient had increased fascial tightness in the lower abdomen. Patient has some tightness in the thoracic. Patient was just up to the manual work today. Patient is getting up 8 time per night due to urgency. Patient has burning in the vaginal area with urination and the increased redness in the vulvar area.  Patient will benefit from skilled therapy to reduce her pain and improve her strength to reduce pelvic pain and back pain.    Personal Factors and Comorbidities Sex;Comorbidity 1    Comorbidities Interstitial Cystitis    Examination-Activity Limitations Toileting;Sit    Examination-Participation Restrictions Interpersonal Relationship;Community Activity    Stability/Clinical Decision Making Stable/Uncomplicated    Rehab Potential Good    PT Frequency 1x / week    PT Duration 12 weeks    PT Treatment/Interventions ADLs/Self Care Home Management;Biofeedback;Cryotherapy;Electrical Stimulation;Moist Heat;Ultrasound;Neuromuscular  re-education;Therapeutic exercise;Therapeutic activities;Patient/family education;Manual techniques;Dry needling;Spinal Manipulations    PT Next  Visit Plan internal work, expanding the rib cage; dry needling of thoracic, go over goals, foam rolling    PT Home Exercise Plan Access Code: [email or web address]    Consulted and Agree with Plan of Care Patient           Patient will benefit from skilled therapeutic intervention in order to improve the following deficits and impairments:  Decreased coordination, Decreased range of motion, Increased fascial restricitons, Increased muscle spasms, Pain, Decreased strength  Visit Diagnosis: Muscle weakness (generalized)  Cramp and spasm  Abnormal posture     Problem List Patient Active Problem List   Diagnosis Date Noted  . Acute encephalopathy   . Gabapentin overdose   . Chronic respiratory failure (HCC)   . Overdose 04/09/2020  . MDD (major depressive disorder), recurrent severe, without psychosis (HCC) 04/19/2018  . Suicide attempt by benzodiazepine overdose (HCC) 04/19/2018    Eulis Foster, PT 04/27/20 10:28 AM   [facility] [address], [address] [place], [place], [address] Phone: [phone]   Fax:  [phone]  Name: TAYTEN HEBER MRN: [id number] Date of Birth: 03-16-2001

## 2020-05-04 ENCOUNTER — Encounter: Payer: Self-pay | Admitting: Physical Therapy

## 2020-05-04 ENCOUNTER — Other Ambulatory Visit: Payer: Self-pay

## 2020-05-04 ENCOUNTER — Ambulatory Visit: Payer: Medicaid Other | Admitting: Physical Therapy

## 2020-05-04 DIAGNOSIS — R252 Cramp and spasm: Secondary | ICD-10-CM

## 2020-05-04 DIAGNOSIS — M6281 Muscle weakness (generalized): Secondary | ICD-10-CM | POA: Diagnosis not present

## 2020-05-04 DIAGNOSIS — R293 Abnormal posture: Secondary | ICD-10-CM

## 2020-05-04 NOTE — Therapy (Signed)
[facility] [address] [place], [place], [address] Phone: [phone]   Fax:  [phone]  Physical Therapy Treatment  Patient Details  Name: Alexis [name] MRN: [id number] Date of Birth: 2000-11-23 Referring Provider (PT): Burman Foster, NP   Encounter Date: 05/04/2020   PT End of Session - 05/04/20 1054    Visit Number 6    Date for PT Re-Evaluation 05/25/20    Authorization Type Medicaid    Authorization Time Period 04/13/2020-05/24/2020    Authorization - Visit Number 2    Authorization - Number of Visits 6    PT Start Time 1015    PT Stop Time 1055    PT Time Calculation (min) 40 min    Activity Tolerance Patient tolerated treatment well    Behavior During Therapy [facility] for tasks assessed/performed           Past Medical History:  Diagnosis Date  . Anxiety   . Asthma   . Bipolar disorder (HCC)    per pt  . Depression   . GERD (gastroesophageal reflux disease)   . Migraines     Past Surgical History:  Procedure Laterality Date  . CYSTO WITH HYDRODISTENSION N/A 04/10/2018   Procedure: CYSTOSCOPY/HYDRODISTENSION;  Surgeon: Crist Fat, MD;  Location: [facility];  Service: Urology;  Laterality: N/A;  . UPPER GI ENDOSCOPY      There were no vitals filed for this visit.   Subjective Assessment - 05/04/20 1019    Subjective The urgency has improved by 30% since last visit. Waking up 3-4 times per night.    Patient Stated Goals work on back pain and eliminate pain with sex, reduce burning    Currently in Pain? Yes    Pain Score 8     Pain Location Bladder    Pain Orientation Mid    Pain Descriptors / Indicators Burning    Pain Type Acute pain    Pain Onset More than a month ago    Pain Frequency Intermittent    Aggravating Factors  burning with urination    Pain Relieving Factors not urinating    Multiple Pain Sites Yes    Pain Score 6    Pain Location Back     Pain Orientation Mid;Upper    Pain Descriptors / Indicators Dull    Pain Type Acute pain    Pain Onset More than a month ago    Pain Frequency Intermittent    Aggravating Factors  not exercising; lifting things at [employer], sitting long period of time, not having correct posture    Pain Relieving Factors stretched, heat, bath, correct posture                          Pelvic Floor Special Questions - 05/04/20 0001    Pelvic Floor Internal Exam Patient confirms identification and approves PT to assess pelvic floor and treatment    Exam Type Vaginal             [facility] Adult PT Treatment/Exercise - 05/04/20 0001      Neuro Re-ed    Neuro Re-ed Details  bulging of the pelvic floor with breath and tactile cues to the pelvic floor      Lumbar Exercises: Supine   Other Supine Lumbar Exercises lay on foam roll along the spine and alternate shoulder flexion, bil. shoulder flexion; roll on the bolster perpendicular to the spine  Manual Therapy   Manual Therapy Myofascial release;Internal Pelvic Floor    Myofascial Release release of the left abdominal region and suprapubic area    Internal Pelvic Floor fascial release to the right levator ani, perineal body, and bilateral sides of the urethra sphincter and bladder while monitoring pain                    PT Short Term Goals - 03/23/20 1018      PT SHORT TERM GOAL #1   Title independent with initial HEP    Time 4    Period Weeks    Status Achieved             PT Long Term Goals - 05/04/20 1020      PT LONG TERM GOAL #1   Title independent with HEP    Baseline still learnging as she progresses in therapy    Time 12    Period Weeks    Status On-going      PT LONG TERM GOAL #2   Title improved posture to reduce the thoracic kyphosis so she has reduction in back pain to </= 1/10    Baseline pain level 6/10; working on it    Time 12    Status On-going      PT LONG TERM GOAL #3   Title  burning with urination decreased to </= 1/10 due to improved vaginal health and elongation of tissue    Baseline pain level is 8/10    Time 12    Period Weeks    Status On-going      PT LONG TERM GOAL #4   Title pain with penile penetraction decreased </= 1/10 due to improved elongation of the pelvic floor muscles    Baseline pain level is 3/10; no intercourse lately    Time 12    Period Weeks    Status On-going                 Plan - 05/04/20 1056    Clinical Impression Statement Patient needs tactile and verbal cues to bulge the pelvic floor. Patient reports her the urgency to urinate decreased by 30%. She continues to have vaginal buring at level 8/10. Patient pelvic floor tone is elevated initially before internal manual work is down and end of treatment decreases. Patient continues to have thoracic pain but may elevated due to her having to take exams. Pelvis in correct alignment. Patient had increased fascial restrictions in the lower and left abdominals. She continues to have increased redness in the vulvar area. Patient will benefit from skilled therapy to reduce her pain and improve her strength to reduce pelvic pain and back pain.    Personal Factors and Comorbidities Sex;Comorbidity 1    Comorbidities Interstitial Cystitis    Examination-Activity Limitations Toileting;Sit    Examination-Participation Restrictions Interpersonal Relationship;Community Activity    Stability/Clinical Decision Making Stable/Uncomplicated    Rehab Potential Good    PT Frequency 1x / week    PT Duration 12 weeks    PT Treatment/Interventions ADLs/Self Care Home Management;Biofeedback;Cryotherapy;Electrical Stimulation;Moist Heat;Ultrasound;Neuromuscular re-education;Therapeutic exercise;Therapeutic activities;Patient/family education;Manual techniques;Dry needling;Spinal Manipulations    PT Next Visit Plan internal work, expanding the rib cage; dry needling of thoracic, , foam rolling; review the  home internal work    PT Home Exercise Plan Access Code: [email or web address]           Patient will benefit from skilled therapeutic intervention in order to improve the following deficits and  impairments:  Decreased coordination,Decreased range of motion,Increased fascial restricitons,Increased muscle spasms,Pain,Decreased strength  Visit Diagnosis: Muscle weakness (generalized)  Cramp and spasm  Abnormal posture     Problem List Patient Active Problem List   Diagnosis Date Noted  . Acute encephalopathy   . Gabapentin overdose   . Chronic respiratory failure (HCC)   . Overdose 04/09/2020  . MDD (major depressive disorder), recurrent severe, without psychosis (HCC) 04/19/2018  . Suicide attempt by benzodiazepine overdose (HCC) 04/19/2018    Eulis Foster, PT 05/04/20 11:04 AM   [facility] [address], [address] [place], [place], [address] Phone: [phone]   Fax:  [phone]  Name: Alexis [name] MRN: [id number] Date of Birth: [birth year]

## 2020-05-11 ENCOUNTER — Ambulatory Visit: Payer: Medicaid Other | Admitting: Physical Therapy

## 2020-05-11 ENCOUNTER — Encounter: Payer: Self-pay | Admitting: Physical Therapy

## 2020-05-11 ENCOUNTER — Other Ambulatory Visit: Payer: Self-pay

## 2020-05-11 DIAGNOSIS — R293 Abnormal posture: Secondary | ICD-10-CM

## 2020-05-11 DIAGNOSIS — M6281 Muscle weakness (generalized): Secondary | ICD-10-CM | POA: Diagnosis not present

## 2020-05-11 DIAGNOSIS — R252 Cramp and spasm: Secondary | ICD-10-CM

## 2020-05-11 NOTE — Therapy (Signed)
[facility] [address] [place], [place], [address] Phone: [phone]   Fax:  [phone]  Physical Therapy Treatment  Patient Details  Name: Alexis [name] MRN: [id number] Date of Birth: July 14, 2000 Referring Provider (PT): Burman Foster, NP   Encounter Date: 05/11/2020   PT End of Session - 05/11/20 1223    Visit Number 7    Date for PT Re-Evaluation 05/25/20    Authorization Type Medicaid    Authorization Time Period 04/13/2020-05/24/2020    Authorization - Visit Number 3    Authorization - Number of Visits 6    PT Start Time 1145    PT Stop Time 1225    PT Time Calculation (min) 40 min    Activity Tolerance Patient tolerated treatment well    Behavior During Therapy [facility] for tasks assessed/performed           Past Medical History:  Diagnosis Date  . Anxiety   . Asthma   . Bipolar disorder (HCC)    per pt  . Depression   . GERD (gastroesophageal reflux disease)   . Migraines     Past Surgical History:  Procedure Laterality Date  . CYSTO WITH HYDRODISTENSION N/A 04/10/2018   Procedure: CYSTOSCOPY/HYDRODISTENSION;  Surgeon: Crist Fat, MD;  Location: [facility];  Service: Urology;  Laterality: N/A;  . UPPER GI ENDOSCOPY      There were no vitals filed for this visit.   Subjective Assessment - 05/11/20 1150    Subjective I feel better from last visit. I have less urgency and burning. Burning is 70% better. Urgency is 60% better.    Patient Stated Goals work on back pain and eliminate pain with sex, reduce burning    Currently in Pain? Yes    Pain Score 3     Pain Location Bladder    Pain Orientation Mid    Pain Descriptors / Indicators Burning    Pain Type Acute pain    Pain Onset More than a month ago    Pain Frequency Intermittent    Aggravating Factors  burning with urination    Pain Relieving Factors not urinating    Multiple Pain Sites Yes    Pain Score 4     Pain Location Back    Pain Orientation Mid;Upper    Pain Descriptors / Indicators Dull    Pain Type Acute pain    Pain Onset More than a month ago    Pain Frequency Intermittent    Aggravating Factors  not exercising, lifting things at [employer], sitting long period of time, not having correct posture    Pain Relieving Factors stretches, heat, bath, correct posture                          Pelvic Floor Special Questions - 05/11/20 0001    Pelvic Floor Internal Exam Patient confirms identification and approves PT to assess pelvic floor and treatment    Exam Type Vaginal    Palpation tightness in the perineal body and thelateral sides of the introisut             [facility] Adult PT Treatment/Exercise - 05/11/20 0001      Lumbar Exercises: Stretches   Other Lumbar Stretch Exercise one leg on the upper step and lean diagonal to stretch the lateral pelvic floor, then stretch the hip adductors with one leg on the upper step  Lumbar Exercises: Supine   Other Supine Lumbar Exercises lay on foam roll along the spine and alternate shoulder flexion, bil. shoulder flexion; roll on the bolster perpendicular to the spine      Manual Therapy   Manual Therapy Myofascial release;Internal Pelvic Floor    Myofascial Release release of the urogenital diaphragm    Internal Pelvic Floor fascial release around the perineal body, along the sides of the bladder, bulbocavernosus                    PT Short Term Goals - 03/23/20 1018      PT SHORT TERM GOAL #1   Title independent with initial HEP    Time 4    Period Weeks    Status Achieved             PT Long Term Goals - 05/11/20 1229      PT LONG TERM GOAL #1   Title independent with HEP    Time 12    Period Weeks    Status On-going      PT LONG TERM GOAL #2   Title improved posture to reduce the thoracic kyphosis so she has reduction in back pain to </= 1/10    Baseline pain level 4/10; working on it     Time 12    Period Weeks    Status On-going      PT LONG TERM GOAL #3   Title burning with urination decreased to </= 1/10 due to improved vaginal health and elongation of tissue    Baseline reduced by 60%    Time 12    Period Weeks    Status On-going      PT LONG TERM GOAL #4   Title pain with penile penetraction decreased </= 1/10 due to improved elongation of the pelvic floor muscles    Baseline pain level is 3/10; no intercourse lately    Time 12    Status On-going                 Plan - 05/11/20 1224    Clinical Impression Statement Patient reports her burning is 70% better. Her urgency is 60% better. Patient reports she is feeling better. She continues to have some tightness in the pelvic floor muscles especially on the perineal body. Patient has some restrictions in the urogenital diaphgram. Patient is doing well with her stretches. Patient has improve mobility of the pelvic floor tissue. Patient will benefit from skilled therapy to reduce her pelvic pain.    Personal Factors and Comorbidities Sex;Comorbidity 1    Comorbidities Interstitial Cystitis    Examination-Activity Limitations Toileting;Sit    Examination-Participation Restrictions Interpersonal Relationship;Community Activity    Stability/Clinical Decision Making Stable/Uncomplicated    Rehab Potential Good    PT Duration 12 weeks    PT Treatment/Interventions ADLs/Self Care Home Management;Biofeedback;Cryotherapy;Electrical Stimulation;Moist Heat;Ultrasound;Neuromuscular re-education;Therapeutic exercise;Therapeutic activities;Patient/family education;Manual techniques;Dry needling;Spinal Manipulations    PT Next Visit Plan internal work, dry needling of thoracic if needed , foam rolling; review the home internal work; if doing well discharge    PT Home Exercise Plan Access Code: [email or web address]    Consulted and Agree with Plan of Care Patient           Patient will benefit from skilled therapeutic intervention  in order to improve the following deficits and impairments:  Decreased coordination,Decreased range of motion,Increased fascial restricitons,Increased muscle spasms,Pain,Decreased strength  Visit Diagnosis: Muscle weakness (generalized)  Cramp and spasm  Abnormal  posture     Problem List Patient Active Problem List   Diagnosis Date Noted  . Acute encephalopathy   . Gabapentin overdose   . Chronic respiratory failure (HCC)   . Overdose 04/09/2020  . MDD (major depressive disorder), recurrent severe, without psychosis (HCC) 04/19/2018  . Suicide attempt by benzodiazepine overdose (HCC) 04/19/2018    Eulis Foster, PT 05/11/20 12:34 PM   [facility] [address], [address] [place], [place], [address] Phone: [phone]   Fax:  [phone]  Name: Alexis [name] MRN: [id number] Date of Birth: Jun 29, 2000

## 2020-05-12 ENCOUNTER — Telehealth (HOSPITAL_COMMUNITY): Payer: Medicaid Other | Admitting: Psychiatry

## 2020-05-17 ENCOUNTER — Other Ambulatory Visit: Payer: Self-pay

## 2020-05-17 ENCOUNTER — Ambulatory Visit
Admission: RE | Admit: 2020-05-17 | Discharge: 2020-05-17 | Disposition: A | Discharge status: Home / Self Care | Payer: Medicaid Other | Source: Ambulatory Visit | Attending: Emergency Medicine | Admitting: Emergency Medicine

## 2020-05-17 VITALS — BP 111/74 | HR 104 | Temp 98.7°F | Resp 14

## 2020-05-17 DIAGNOSIS — N3001 Acute cystitis with hematuria: Secondary | ICD-10-CM | POA: Diagnosis not present

## 2020-05-17 HISTORY — DX: Other specified disorders of muscle: M62.89

## 2020-05-17 HISTORY — DX: Urinary tract infection, site not specified: N39.0

## 2020-05-17 LAB — POCT URINALYSIS DIP (MANUAL ENTRY)
Bilirubin, UA: NEGATIVE
Glucose, UA: NEGATIVE mg/dL
Ketones, POC UA: NEGATIVE mg/dL
Nitrite, UA: NEGATIVE
Protein Ur, POC: NEGATIVE mg/dL
Spec Grav, UA: 1.02 (ref 1.010–1.025)
Urobilinogen, UA: 0.2 E.U./dL
pH, UA: 6.5 (ref 5.0–8.0)

## 2020-05-17 LAB — POCT URINE PREGNANCY: Preg Test, Ur: NEGATIVE

## 2020-05-17 MED ORDER — CEPHALEXIN 500 MG PO CAPS: 500.0000 mg | ORAL_CAPSULE | Freq: Two times a day (BID) | ORAL | 0 refills | Status: AC

## 2020-05-17 MED ORDER — FLUCONAZOLE 200 MG PO TABS: 200.0000 mg | ORAL_TABLET | Freq: Once | ORAL | 0 refills | Status: AC

## 2020-05-17 NOTE — Discharge Instructions (Addendum)
Take antibiotic twice daily with food. °Important to drink plenty of water throughout the day. °May take Azo as needed for burning sensation. °Return for worsening urinary symptoms, blood in urine, abdominal or back pain, fever. °

## 2020-05-17 NOTE — ED Provider Notes (Signed)
[name]    CSN: [id number] Arrival date & time: 05/17/20  1200      History   Chief Complaint Chief Complaint  Patient presents with  . Dysuria  . Polyuria  . Appointment    1200    HPI Alexis [name] is a 19 y.o. female  Presenting for 3-day course of urinary symptoms.  Endorsing dysuria, frequency, urgency.  No fever, back pain, abdominal pain, vaginal discharge.  LMP 05/09/2020.  Reports history of recurrent UTI: Followed routinely by urology.  Past Medical History:  Diagnosis Date  . Anxiety   . Asthma   . Bipolar disorder (HCC)    per pt  . Depression   . GERD (gastroesophageal reflux disease)   . Migraines   . Pelvic floor dysfunction   . UTI (urinary tract infection)     Patient Active Problem List   Diagnosis Date Noted  . Acute encephalopathy   . Gabapentin overdose   . Chronic respiratory failure (HCC)   . Overdose 04/09/2020  . MDD (major depressive disorder), recurrent severe, without psychosis (HCC) 04/19/2018  . Suicide attempt by benzodiazepine overdose (HCC) 04/19/2018    Past Surgical History:  Procedure Laterality Date  . CYSTO WITH HYDRODISTENSION N/A 04/10/2018   Procedure: CYSTOSCOPY/HYDRODISTENSION;  Surgeon: Crist Fat, MD;  Location: [facility];  Service: Urology;  Laterality: N/A;  . UPPER GI ENDOSCOPY      OB History   No obstetric history on file.      Home Medications    Prior to Admission medications   Medication Sig Start Date End Date Taking? Authorizing Provider  diazepam (VALIUM) 5 MG tablet Take 1 tablet (5 mg total) by mouth every 8 (eight) hours. 04/16/20  Yes Antonieta Pert, MD  famotidine (PEPCID) 20 MG tablet Take 1 tablet (20 mg total) by mouth 2 (two) times daily. 04/16/20  Yes Antonieta Pert, MD  fluticasone (FLOVENT HFA) 110 MCG/ACT inhaler Inhale 1 puff into the lungs 2 (two) times daily.  05/03/19  Yes [provider]  gabapentin (NEURONTIN) 100  MG capsule Take 1 capsule (100 mg total) by mouth as needed (anxiety). 04/16/20  Yes Antonieta Pert, MD  hydrOXYzine (ATARAX/VISTARIL) 25 MG tablet Take 1 tablet (25 mg total) by mouth every 8 (eight) hours as needed for anxiety. 04/16/20  Yes Antonieta Pert, MD  lamoTRIgine (LAMICTAL) 100 MG tablet Take 1 tablet (100 mg total) by mouth daily. 04/16/20  Yes Antonieta Pert, MD  Norgestimate-Ethinyl Estradiol Triphasic (TRI-LO-MARZIA) 0.18/0.215/0.25 MG-25 MCG tab Take 1 tablet by mouth at bedtime. Birth control method 04/22/18  Yes Armandina Stammer I, NP  phenazopyridine (PYRIDIUM) 100 MG tablet Take 1 tablet (100 mg total) by mouth 3 (three) times daily. For bladder spasms Patient taking differently: Take 100 mg by mouth as needed for pain (bladder spasms). 04/22/18  Yes Armandina Stammer I, NP  albuterol (PROVENTIL HFA;VENTOLIN HFA) 108 (90 Base) MCG/ACT inhaler Inhale 1-2 puffs into the lungs every 6 (six) hours as needed for wheezing or shortness of breath. 04/22/18   Armandina Stammer I, NP  cephALEXin (KEFLEX) 500 MG capsule Take 1 capsule (500 mg total) by mouth 2 (two) times daily for 5 days. 05/17/20 05/22/20  Hall-Potvin, [place], PA-C  fluconazole (DIFLUCAN) 200 MG tablet Take 1 tablet (200 mg total) by mouth once for 1 dose. May repeat in 72 hours if needed 05/17/20 05/17/20  Hall-Potvin, [place], PA-C  polyethylene glycol (MIRALAX / GLYCOLAX) 17 g  packet Take 17 g by mouth daily as needed for mild constipation. 04/16/20   Antonieta Pert, MD  QUEtiapine Fumarate (SEROQUEL PO) Take 25 mg by mouth.    [provider]    Family History Family History  Problem Relation Age of Onset  . Asthma Mother   . Thyroid disease Mother   . Healthy Sister     Social History Social History   Tobacco Use  . Smoking status: Never Smoker  . Smokeless tobacco: Never Used  Vaping Use  . Vaping Use: Never used  Substance Use Topics  . Alcohol use: Yes    Comment: socially  . Drug  use: Never     Allergies   Other and Tomato   Review of Systems Review of Systems  Constitutional: Negative for fatigue and fever.  Respiratory: Negative for cough and shortness of breath.   Cardiovascular: Negative for chest pain and palpitations.  Gastrointestinal: Negative for constipation and diarrhea.  Genitourinary: Positive for dysuria, frequency and urgency. Negative for flank pain, hematuria, pelvic pain, vaginal bleeding, vaginal discharge and vaginal pain.     Physical Exam Triage Vital Signs ED Triage Vitals [05/17/20 1218]  Enc Vitals Group     BP 111/74     Pulse Rate (!) 104     Resp 14     Temp 98.7 F (37.1 C)     Temp Source Oral     SpO2 96 %     Weight      Height      Head Circumference      Peak Flow      Pain Score 0     Pain Loc      Pain Edu?      Excl. in GC?    No data found.  Updated Vital Signs BP 111/74   Pulse (!) 104   Temp 98.7 F (37.1 C) (Oral)   Resp 14   LMP 05/09/2020 (Approximate)   SpO2 96%   Visual Acuity Right Eye Distance:   Left Eye Distance:   Bilateral Distance:    Right Eye Near:   Left Eye Near:    Bilateral Near:     Physical Exam Constitutional:      General: She is not in acute distress. HENT:     Head: Normocephalic and atraumatic.  Eyes:     General: No scleral icterus.    Pupils: Pupils are equal, round, and reactive to light.  Cardiovascular:     Rate and Rhythm: Normal rate.  Pulmonary:     Effort: Pulmonary effort is normal.  Abdominal:     General: Bowel sounds are normal.     Palpations: Abdomen is soft.     Tenderness: There is no abdominal tenderness. There is no right CVA tenderness, left CVA tenderness or guarding.  Skin:    Coloration: Skin is not jaundiced or pale.  Neurological:     Mental Status: She is alert and oriented to person, place, and time.      UC Treatments / Results  Labs (all labs ordered are listed, but only abnormal results are displayed) Labs Reviewed   POCT URINALYSIS DIP (MANUAL ENTRY) - Abnormal; Notable for the following components:      Result Value   Clarity, UA cloudy (*)    Blood, UA small (*)    Leukocytes, UA Trace (*)    All other components within normal limits  URINE CULTURE  POCT URINE PREGNANCY    EKG  Radiology No results found.  Procedures Procedures (including critical care time)  Medications Ordered in UC Medications - No data to display  Initial Impression / Assessment and Plan / UC Course  I have reviewed the triage vital signs and the nursing notes.  Pertinent labs & imaging results that were available during my care of the patient were reviewed by me and considered in my medical decision making (see chart for details).     Urine as above, culture pending.  Given symptoms, history, will cover empirically for UTI.  Return precautions discussed, pt verbalized understanding and is agreeable to plan. Final Clinical Impressions(s) / UC Diagnoses   Final diagnoses:  Acute cystitis with hematuria     Discharge Instructions     Take antibiotic twice daily with food. Important to drink plenty of water throughout the day. May take Azo as needed for burning sensation. Return for worsening urinary symptoms, blood in urine, abdominal or back pain, fever.    ED Prescriptions    Medication Sig Dispense Auth. Provider   cephALEXin (KEFLEX) 500 MG capsule Take 1 capsule (500 mg total) by mouth 2 (two) times daily for 5 days. 10 capsule Hall-Potvin, [place], PA-C   fluconazole (DIFLUCAN) 200 MG tablet Take 1 tablet (200 mg total) by mouth once for 1 dose. May repeat in 72 hours if needed 2 tablet Hall-Potvin, [place], PA-C     PDMP not reviewed this encounter.   Hall-Potvin, [place], [place] 05/17/20 1318

## 2020-05-17 NOTE — ED Triage Notes (Signed)
C/O dysuria, polyuria and urinary urgency x approx 3 days without fever.  Denies back pain or abd pain.

## 2020-05-18 ENCOUNTER — Encounter: Payer: Self-pay | Admitting: Physical Therapy

## 2020-05-18 ENCOUNTER — Ambulatory Visit: Payer: Medicaid Other | Admitting: Physical Therapy

## 2020-05-18 DIAGNOSIS — M6281 Muscle weakness (generalized): Secondary | ICD-10-CM

## 2020-05-18 DIAGNOSIS — R252 Cramp and spasm: Secondary | ICD-10-CM

## 2020-05-18 DIAGNOSIS — R293 Abnormal posture: Secondary | ICD-10-CM

## 2020-05-18 LAB — URINE CULTURE: Culture: NO GROWTH

## 2020-05-18 NOTE — Therapy (Signed)
[facility] [address], [address] [place], [place], [address] Phone: [phone]   Fax:  [phone]  Physical Therapy Treatment  Patient Details  Name: Alexis [name] MRN: [id number] Date of Birth: 01-07-2001 Referring Provider (PT): Burman Foster, NP   Encounter Date: 05/18/2020   PT End of Session - 05/18/20 1201    Visit Number 8    Date for PT Re-Evaluation 08/17/20    Authorization Type Medicaid    Authorization Time Period 04/13/2020-05/24/2020    Authorization - Visit Number 4    Authorization - Number of Visits 6    PT Start Time 1145    PT Stop Time 1223    PT Time Calculation (min) 38 min    Activity Tolerance Patient tolerated treatment well    Behavior During Therapy [facility] for tasks assessed/performed           Past Medical History:  Diagnosis Date  . Anxiety   . Asthma   . Bipolar disorder (HCC)    per pt  . Depression   . GERD (gastroesophageal reflux disease)   . Migraines   . Pelvic floor dysfunction   . UTI (urinary tract infection)     Past Surgical History:  Procedure Laterality Date  . CYSTO WITH HYDRODISTENSION N/A 04/10/2018   Procedure: CYSTOSCOPY/HYDRODISTENSION;  Surgeon: Crist Fat, MD;  [facility];  Service: Urology;  Laterality: N/A;  . UPPER GI ENDOSCOPY      There were no vitals filed for this visit.   Subjective Assessment - 05/18/20 1154    Subjective Still having burning and urgency. I went to ED for possible UTI and they gave me antibiotics. Waiting to see if the culture shows an infection.    Patient Stated Goals work on back pain and eliminate pain with sex, reduce burning    Currently in Pain? Yes    Pain Score 3    with urobel   Pain Location Bladder    Pain Orientation Mid    Pain Descriptors / Indicators Burning    Pain Type Acute pain    Pain Onset More than a month ago    Pain Frequency Intermittent    Aggravating  Factors  burning with urination    Pain Relieving Factors not urinating    Effect of Pain on Daily Activities reduce pain during intercourse, reduce back pain and strengthen the muscles    Multiple Pain Sites No    Pain Score 2    Pain Location Back    Pain Orientation Lower    Pain Descriptors / Indicators Dull    Pain Type Acute pain    Pain Onset More than a month ago    Pain Frequency Intermittent    Aggravating Factors  not exercising, lifting things at the library, sitting long period of time, not having correct posture    Pain Relieving Factors stretches, heat              [facility] PT Assessment - 05/18/20 0001      Assessment   Medical Diagnosis chronic cystitis(w/o) N30.20; Dysuria R30.0; Pelvic/Perineal pain R10.2; Urinary Frequency R35.0    Referring Provider (PT) Wallace Cullens Ronald Pippins, NP    Onset Date/Surgical Date --   12/26/2019   Prior Therapy yes      Precautions   Precautions None      Restrictions   Weight Bearing Restrictions No      Home Environment  Living Environment Private residence      Prior Function   Level of Independence Independent    Vocation Student    Leisure Modeling but taking a break for school      Cognition   Overall Cognitive Status Within Functional Limits for tasks assessed      Posture/Postural Control   Posture/Postural Control Postural limitations    Postural Limitations Forward head;Rounded Shoulders;Increased thoracic kyphosis      AROM   Overall AROM Comments Lumbar ROM is full      Strength   Overall Strength Comments middle and lower  trap bil. 3=/5    Right Hip ABduction 4+/5    Right Hip ADduction 5/5    Left Hip External Rotation 5/5    Left Hip ABduction 4+/5    Left Hip ADduction 5/5                      Pelvic Floor Special Questions - 05/18/20 0001    Urinary Leakage No    Urinary urgency Yes    Urinary frequency 2-3 times per night    Palpation tightness in the perineal body and thelateral  sides of the introisut             [facility] Adult PT Treatment/Exercise - 05/18/20 0001      Lumbar Exercises: Stretches   Active Hamstring Stretch Right;Left;1 rep;30 seconds    Active Hamstring Stretch Limitations supine    Double Knee to Chest Stretch 1 rep;30 seconds    Lower Trunk Rotation 30 seconds    Lower Trunk Rotation Limitations right and left    Hip Flexor Stretch Right;Left;1 rep    Hip Flexor Stretch Limitations sidely    Piriformis Stretch Right;Left;1 rep;30 seconds    Piriformis Stretch Limitations pigeon pose    Other Lumbar Stretch Exercise hip hinging to middle then each side    Other Lumbar Stretch Exercise sit on prickly ball to massage the pelvic floor outside her pants      Shoulder Exercises: Prone   Extension Strengthening;Both;15 reps    Horizontal ABduction 1 Strengthening;Both;15 reps    Horizontal ABduction 1 Limitations thumbs up    Other Prone Exercises bil. shoulder flexion in V 15                    PT Short Term Goals - 05/18/20 1207      PT SHORT TERM GOAL #1   Title independent with initial HEP    Time 4    Period Weeks    Status Achieved    Target Date 03/30/20             PT Long Term Goals - 05/18/20 1207      PT LONG TERM GOAL #1   Title independent with HEP    Baseline still learnging as she progresses in therapy    Time 12    Period Weeks    Status On-going      PT LONG TERM GOAL #2   Title improved posture to reduce the thoracic kyphosis so she has reduction in back pain to </= 1/10    Baseline pain level 3/10; working on it    Time 12    Period Weeks    Status On-going      PT LONG TERM GOAL #3   Title burning with urination decreased to </= 1/10 due to improved vaginal health and elongation of tissue    Baseline reduced  by 60% until 05/17/2020 when being tested for a UTI    Time 12    Period Weeks    Status On-going      PT LONG TERM GOAL #4   Title pain with penile penetraction decreased </= 1/10  due to improved elongation of the pelvic floor muscles    Baseline no intercourse lately, ants it deferred    Time 12    Period Weeks    Status Deferred                 Plan - 05/18/20 1212    Clinical Impression Statement Patient has increased urgency and burning since yesterday when she was at urgent care to be tested for a UTI. Pateint did not have internal work due to possible active infection. Patient has full lumbar ROM. She continues to have weakness in her hip abductors and back. Patient is sitting with less slouching. Patient had to miss therapy for several weeks due to having a medical issue. Patient goes to the bathroom 3-4 times per night now but prior to 05/17/2020 it was 2x per night. Patient goes to the bathroom every 2 hours but prior to 05/17/2020 it was every 4 hours. Patient is not having intercourse at this time so the goal will be deferred. Pelvic floor tightness in the levator ani, perineal body and obturator internist. Patient will benefit from skilled therapy to elongate the pelvic floor muscles to reduce her burning and urgency.    Personal Factors and Comorbidities Sex;Comorbidity 1    Comorbidities Interstitial Cystitis    Examination-Activity Limitations Toileting;Sit    Examination-Participation Restrictions Interpersonal Relationship;Community Activity    Stability/Clinical Decision Making Stable/Uncomplicated    Rehab Potential Good    PT Frequency 1x / week    PT Duration 12 weeks    PT Treatment/Interventions ADLs/Self Care Home Management;Biofeedback;Cryotherapy;Electrical Stimulation;Moist Heat;Ultrasound;Neuromuscular re-education;Therapeutic exercise;Therapeutic activities;Patient/family education;Manual techniques;Dry needling;Spinal Manipulations    PT Next Visit Plan internal work, dry needling of thoracic if needed , foam rolling; review the home internal work;    PT Home Exercise Plan Access Code: [email or web address]    Consulted and Agree with Plan of Care  Patient           Patient will benefit from skilled therapeutic intervention in order to improve the following deficits and impairments:  Decreased coordination,Decreased range of motion,Increased fascial restricitons,Increased muscle spasms,Pain,Decreased strength  Visit Diagnosis: Muscle weakness (generalized) - Plan: PT plan of care cert/re-cert  Cramp and spasm - Plan: PT plan of care cert/re-cert  Abnormal posture - Plan: PT plan of care cert/re-cert     Problem List Patient Active Problem List   Diagnosis Date Noted  . Acute encephalopathy   . Gabapentin overdose   . Chronic respiratory failure (HCC)   . Overdose 04/09/2020  . MDD (major depressive disorder), recurrent severe, without psychosis (HCC) 04/19/2018  . Suicide attempt by benzodiazepine overdose (HCC) 04/19/2018    Eulis Foster, PT 05/18/20 12:28 PM   [facility] [address], [address] [place], [place], [address] Phone: [phone]   Fax:  [phone]  Name: Alexis [name] MRN: [id number] Date of Birth: 2000/12/25

## 2020-06-01 ENCOUNTER — Other Ambulatory Visit: Payer: Self-pay

## 2020-06-01 ENCOUNTER — Ambulatory Visit: Payer: Medicaid Other | Attending: Urology | Admitting: Physical Therapy

## 2020-06-01 ENCOUNTER — Encounter: Payer: Self-pay | Admitting: Physical Therapy

## 2020-06-01 DIAGNOSIS — R252 Cramp and spasm: Secondary | ICD-10-CM | POA: Insufficient documentation

## 2020-06-01 DIAGNOSIS — R293 Abnormal posture: Secondary | ICD-10-CM | POA: Diagnosis present

## 2020-06-01 DIAGNOSIS — M6281 Muscle weakness (generalized): Secondary | ICD-10-CM | POA: Diagnosis not present

## 2020-06-01 NOTE — Therapy (Signed)
[facility] [address], [place], [place], [address] Phone: [phone]   Fax:  [phone]  Physical Therapy Treatment  Patient Details  Name: Alexis [name] MRN: [id number] Date of Birth: 06-22-2000 Referring Provider (PT): Jed Limerick, NP   Encounter Date: 06/01/2020   PT End of Session - 06/01/20 1224    Visit Number 9    Date for PT Re-Evaluation 08/17/20    Authorization Type Medicaid    Authorization Time Period 06/01/2020-06/21/2020    Authorization - Visit Number 1    Authorization - Number of Visits 3    PT Start Time 6294    PT Stop Time 1223    PT Time Calculation (min) 38 min    Activity Tolerance Patient tolerated treatment well    Behavior During Therapy [facility] for tasks assessed/performed           Past Medical History:  Diagnosis Date  . Anxiety   . Asthma   . Bipolar disorder ([place])    per pt  . Depression   . GERD (gastroesophageal reflux disease)   . Migraines   . Pelvic floor dysfunction   . UTI (urinary tract infection)     Past Surgical History:  Procedure Laterality Date  . CYSTO WITH HYDRODISTENSION N/A 04/10/2018   Procedure: CYSTOSCOPY/HYDRODISTENSION;  Surgeon: Ardis Hughs, MD;  Location: [facility];  Service: Urology;  Laterality: N/A;  . UPPER GI ENDOSCOPY      There were no vitals filed for this visit.   Subjective Assessment - 06/01/20 1152    Subjective The vaginal urgency is 50% better. I still have the burning.    Patient Stated Goals work on back pain and eliminate pain with sex, reduce burning    Currently in Pain? Yes    Pain Score 5     Pain Location Bladder    Pain Orientation Mid    Pain Descriptors / Indicators Burning    Pain Type Acute pain    Pain Onset More than a month ago    Pain Frequency Intermittent    Aggravating Factors  burning with urination    Pain Relieving Factors not urinating    Multiple Pain Sites Yes     Pain Score 3    Pain Location Back    Pain Orientation Lower    Pain Descriptors / Indicators Dull    Pain Type Acute pain    Pain Onset More than a month ago    Pain Frequency Intermittent    Aggravating Factors  not exercising, lifting things at [employer], sitting long period of time, not having correct posture    Pain Relieving Factors stretches, heat                             [facility] Adult PT Treatment/Exercise - 06/01/20 0001      Self-Care   Self-Care Other Self-Care Comments    Other Self-Care Comments  discussed getting back to using the pelvic wand 3 times per week, using the coconut oil morning and night, using the foam roll, and do the diaphragmatic breathing      Lumbar Exercises: Stretches   Hip Flexor Stretch Right;Left;1 rep    Hip Flexor Stretch Limitations sidely    ITB Stretch Right;Left;1 rep;30 seconds    ITB Stretch Limitations foam roll    Piriformis Stretch Right;Left;1 rep;30 seconds    Piriformis Stretch  Limitations foam roll    Other Lumbar Stretch Exercise sitting on foam roll and massage the pelvic floor    Other Lumbar Stretch Exercise lay on foam roll and mobilize the thoracic spine      Lumbar Exercises: Prone   Other Prone Lumbar Exercises prayer stretch with arm foam roll to increase thoracic extension then more arms to side to stretch lateral aspect      Lumbar Exercises: Quadruped   Other Quadruped Lumbar Exercises quadruped hip hinge with hip IR to stretch the pelvic floor, tried with patient sitting on fingers to do trigger point massage but was too ticklish.                    PT Short Term Goals - 05/18/20 1207      PT SHORT TERM GOAL #1   Title independent with initial HEP    Time 4    Period Weeks    Status Achieved    Target Date 03/30/20             PT Long Term Goals - 06/01/20 1230      PT LONG TERM GOAL #1   Title independent with HEP    Baseline still learnging as she progresses in  therapy    Time 12    Period Weeks    Status On-going      PT LONG TERM GOAL #2   Title improved posture to reduce the thoracic kyphosis so she has reduction in back pain to </= 1/10    Baseline pain level 3/10; working on it    Time 12    Period Weeks    Status On-going      PT LONG TERM GOAL #3   Title burning with urination decreased to </= 1/10 due to improved vaginal health and elongation of tissue    Baseline reduced by 60%    Time 12    Period Weeks    Status On-going      PT LONG TERM GOAL #4   Title pain with penile penetraction decreased </= 1/10 due to improved elongation of the pelvic floor muscles    Baseline no intercourse lately, ants it deferred    Time 12    Status Deferred                 Plan - 06/01/20 1226    Clinical Impression Statement Patient has returned to her HEP with using the vaginal wand, stretches, and diaphragmatic breathing. Patient [place] with some pain but it did not last. Patient pelvis in correct alignment. Patient has some burning and using the coconut oil to help with the burning. Patient is working on her strength. Patient is not waking up as frequently in the middle of the night. Patient will benefit from skilled therapy to elongate the pelvic floor muscles to reduce her burning and urgency.    Personal Factors and Comorbidities Sex;Comorbidity 1    Comorbidities Interstitial Cystitis    Examination-Activity Limitations Toileting;Sit    Examination-Participation Restrictions Interpersonal Relationship;Community Activity    Stability/Clinical Decision Making Stable/Uncomplicated    Rehab Potential Good    PT Frequency 1x / week    PT Duration 12 weeks    PT Treatment/Interventions ADLs/Self Care Home Management;Biofeedback;Cryotherapy;Electrical Stimulation;Moist Heat;Ultrasound;Neuromuscular re-education;Therapeutic exercise;Therapeutic activities;Patient/family education;Manual techniques;Dry  needling;Spinal Manipulations    PT Next Visit Plan internal work, dry needling of thoracic if needed , foam rolling;    PT  Home Exercise Plan Access Code: [email or web address]    Consulted and Agree with Plan of Care Patient           Patient will benefit from skilled therapeutic intervention in order to improve the following deficits and impairments:  Decreased coordination,Decreased range of motion,Increased fascial restricitons,Increased muscle spasms,Pain,Decreased strength  Visit Diagnosis: Muscle weakness (generalized)  Cramp and spasm  Abnormal posture     Problem List Patient Active Problem List   Diagnosis Date Noted  . Acute encephalopathy   . Gabapentin overdose   . Chronic respiratory failure (HCC)   . Overdose 04/09/2020  . MDD (major depressive disorder), recurrent severe, without psychosis (HCC) 04/19/2018  . Suicide attempt by benzodiazepine overdose (HCC) 04/19/2018    Eulis Foster, PT 06/01/20 12:32 PM   [facility] [address] [place], [place], [address] Phone: [phone]   Fax:  [phone]  Name: Alexis [name] MRN: [id number] Date of Birth: 11-26-2000

## 2020-06-09 ENCOUNTER — Other Ambulatory Visit: Payer: Self-pay

## 2020-06-09 ENCOUNTER — Ambulatory Visit: Payer: Medicaid Other | Admitting: Physical Therapy

## 2020-06-09 ENCOUNTER — Encounter: Payer: Self-pay | Admitting: Physical Therapy

## 2020-06-09 DIAGNOSIS — M6281 Muscle weakness (generalized): Secondary | ICD-10-CM

## 2020-06-09 DIAGNOSIS — R293 Abnormal posture: Secondary | ICD-10-CM

## 2020-06-09 DIAGNOSIS — R252 Cramp and spasm: Secondary | ICD-10-CM

## 2020-06-09 NOTE — Therapy (Signed)
[facility] [address] [place], [place], [address] Phone: [phone]   Fax:  [phone]  Physical Therapy Treatment  Patient Details  Name: Alexis [name] MRN: [id number] Date of Birth: [birth year] Referring Provider (PT): Burman Foster, NP   Encounter Date: 06/09/2020   PT End of Session - 06/09/20 1032    Visit Number 10    Date for PT Re-Evaluation 08/17/20    Authorization Time Period 06/01/2020-06/21/2020    Authorization - Visit Number 2    Authorization - Number of Visits 3    PT Start Time 1015    PT Stop Time 1055    PT Time Calculation (min) 40 min    Activity Tolerance Patient tolerated treatment well    Behavior During Therapy [facility] for tasks assessed/performed           Past Medical History:  Diagnosis Date  . Anxiety   . Asthma   . Bipolar disorder (HCC)    per pt  . Depression   . GERD (gastroesophageal reflux disease)   . Migraines   . Pelvic floor dysfunction   . UTI (urinary tract infection)     Past Surgical History:  Procedure Laterality Date  . CYSTO WITH HYDRODISTENSION N/A 04/10/2018   Procedure: CYSTOSCOPY/HYDRODISTENSION;  Surgeon: Crist Fat, MD;  Location: [facility];  Service: Urology;  Laterality: N/A;  . UPPER GI ENDOSCOPY      There were no vitals filed for this visit.   Subjective Assessment - 06/09/20 1020    Subjective I have been doing good. I had increased pain after my period. I have less urgency. Not alot of back pain.    Patient Stated Goals work on back pain and eliminate pain with sex, reduce burning.    Currently in Pain? Yes    Pain Score 3     Pain Location Pelvis    Pain Orientation Mid    Pain Descriptors / Indicators Burning;Cramping    Pain Type Acute pain    Pain Onset More than a month ago    Pain Frequency Intermittent    Aggravating Factors  burning with urination    Pain Relieving Factors not urinating     Multiple Pain Sites Yes    Pain Score 3    Pain Location Back    Pain Orientation Lower    Pain Descriptors / Indicators Dull    Pain Type Acute pain    Pain Onset More than a month ago    Pain Frequency Intermittent    Aggravating Factors  not exercising, lifting things at the library, sitting long period of time, not having correct posture    Pain Relieving Factors stretches, heat                             [facility] Adult PT Treatment/Exercise - 06/09/20 0001      Balance Poses: Yoga   Warrior I 3 reps;15 seconds   bilateral   Warrior II 15 seconds;3 reps   bilateral   Warrior III 3 reps;15 seconds   bilteral     Lumbar Exercises: Stretches   Other Lumbar Stretch Exercise lay on foam roll and mobilize the thoracic spine      Lumbar Exercises: Prone   Other Prone Lumbar Exercises prone on physioball bilateral shoulder extension 1# keeping cervical neutral,      Shoulder Exercises: Prone   Horizontal  ABduction 1 Strengthening;Both;15 reps;Weights    Horizontal ABduction 1 Weight (lbs) 1    Horizontal ABduction 1 Limitations prone on ball    Other Prone Exercises prone on ball with Y movement holding 1 # each hand with cervical neutral      Shoulder Exercises: Standing   Other Standing Exercises standing with cane into the groin and flex with straight back and hip hinge, needs VC to not flex at thoracic                    PT Short Term Goals - 05/18/20 1207      PT SHORT TERM GOAL #1   Title independent with initial HEP    Time 4    Period Weeks    Status Achieved    Target Date 03/30/20             PT Long Term Goals - 06/09/20 1054      PT LONG TERM GOAL #1   Title independent with HEP    Baseline still learnging as she progresses in therapy    Time 12    Period Weeks    Status On-going      PT LONG TERM GOAL #2   Title improved posture to reduce the thoracic kyphosis so she has reduction in back pain to </= 1/10    Baseline  pain level 3/10; working on it    Time 12    Period Weeks    Status On-going      PT LONG TERM GOAL #3   Title burning with urination decreased to </= 1/10 due to improved vaginal health and elongation of tissue    Baseline reduced by 60%    Time 12    Period Weeks    Status On-going      PT LONG TERM GOAL #4   Title pain with penile penetraction decreased </= 1/10 due to improved elongation of the pelvic floor muscles    Baseline no intercourse lately, ants it deferred    Time 12    Period Weeks    Status Deferred                 Plan - 06/09/20 1032    Clinical Impression Statement Patient is having less pain and urgency. She has difficulty with hip hinging and wants to flex at the mid thoracic spine. Patient is doing more hiking. Patient likes using the foam roll. Patient needs core strength to stabilize her pelvis and trunk with activities instead of her pelvic floor overworking. Patient pelvis in correct alignment. Patient will benefit from skilled therapy to improve core strength and elongate pelvic floor muscles to reduce her burning and urgency.    Personal Factors and Comorbidities Sex;Comorbidity 1    Comorbidities Interstitial Cystitis    Examination-Activity Limitations Toileting;Sit    Examination-Participation Restrictions Interpersonal Relationship;Community Activity    Stability/Clinical Decision Making Stable/Uncomplicated    Rehab Potential Good    PT Frequency 1x / week    PT Duration 12 weeks    PT Treatment/Interventions ADLs/Self Care Home Management;Biofeedback;Cryotherapy;Electrical Stimulation;Moist Heat;Ultrasound;Neuromuscular re-education;Therapeutic exercise;Therapeutic activities;Patient/family education;Manual techniques;Dry needling;Spinal Manipulations    PT Next Visit Plan continue with core exercises; put in medicaid renewal if needs to continue    PT Home Exercise Plan Access Code: [email or web address]    Consulted and Agree with Plan of Care Patient            Patient will benefit from skilled therapeutic intervention in  order to improve the following deficits and impairments:  Decreased coordination,Decreased range of motion,Increased fascial restricitons,Increased muscle spasms,Pain,Decreased strength  Visit Diagnosis: Muscle weakness (generalized)  Cramp and spasm  Abnormal posture     Problem List Patient Active Problem List   Diagnosis Date Noted  . Acute encephalopathy   . Gabapentin overdose   . Chronic respiratory failure (HCC)   . Overdose 04/09/2020  . MDD (major depressive disorder), recurrent severe, without psychosis (HCC) 04/19/2018  . Suicide attempt by benzodiazepine overdose (HCC) 04/19/2018    Eulis Foster, PT 06/09/20 10:56 AM   [facility] [address], [address] [place], [place], [address] Phone: [phone]   Fax:  [phone]  Name: Alexis [name] MRN: [id number] Date of Birth: 22-Nov-2000

## 2020-06-16 ENCOUNTER — Other Ambulatory Visit: Payer: Self-pay

## 2020-06-16 ENCOUNTER — Encounter: Payer: Self-pay | Admitting: Physical Therapy

## 2020-06-16 ENCOUNTER — Ambulatory Visit: Payer: Medicaid Other | Admitting: Physical Therapy

## 2020-06-16 DIAGNOSIS — R252 Cramp and spasm: Secondary | ICD-10-CM

## 2020-06-16 DIAGNOSIS — R293 Abnormal posture: Secondary | ICD-10-CM

## 2020-06-16 DIAGNOSIS — M6281 Muscle weakness (generalized): Secondary | ICD-10-CM

## 2020-06-16 NOTE — Patient Instructions (Signed)
Access Code: [email or web address] URL: [email or web address] Date: 06/16/2020 Prepared by: Eulis Foster  Exercises Supine Shoulder Horizontal Abduction with Resistance - 1 x daily - 7 x weekly - 1 sets - 10 reps Supine March - 1 x daily - 7 x weekly - 1 sets - 10 reps Half-Kneeling Shoulder Extension with Resistance - 1 x daily - 7 x weekly - 1 sets - 10 reps Seated Shoulder Diagonal Pulls with Resistance - 1 x daily - 7 x weekly - 2 sets - 10 reps Mini Squat - 1 x daily - 7 x weekly - 1 sets - 5 reps - 5 sec hold Warrior II - 1 x daily - 7 x weekly - 1 sets - 2 reps - 15 sec hold Warrior I - 1 x daily - 7 x weekly - 1 sets - 2 reps - 15 sec hold [facility] [address], [address] [place], [place] [address] Phone # [phone] Fax [phone]

## 2020-06-16 NOTE — Therapy (Signed)
[facility] [address], [place], [place], [address] Phone: [phone]   Fax:  [phone]  Physical Therapy Treatment  Patient Details  Name: Alexis [name] MRN: [id number] Date of Birth: [birth year] Referring Provider (PT): Jed Limerick, NP   Encounter Date: 06/16/2020   PT End of Session - 06/16/20 1058    Visit Number 11    Date for PT Re-Evaluation 08/17/20    Authorization Type Medicaid    Authorization Time Period 06/01/2020-06/21/2020    Authorization - Visit Number 3    Authorization - Number of Visits 3    PT Start Time 3220    PT Stop Time 1053    PT Time Calculation (min) 38 min    Activity Tolerance Patient tolerated treatment well    Behavior During Therapy [facility] for tasks assessed/performed           Past Medical History:  Diagnosis Date  . Anxiety   . Asthma   . Bipolar disorder ([place])    per pt  . Depression   . GERD (gastroesophageal reflux disease)   . Migraines   . Pelvic floor dysfunction   . UTI (urinary tract infection)     Past Surgical History:  Procedure Laterality Date  . CYSTO WITH HYDRODISTENSION N/A 04/10/2018   Procedure: CYSTOSCOPY/HYDRODISTENSION;  Surgeon: Ardis Hughs, MD;  Location: [facility];  Service: Urology;  Laterality: N/A;  . UPPER GI ENDOSCOPY      There were no vitals filed for this visit.   Subjective Assessment - 06/16/20 1018    Subjective Today I am having a little bit of a flare up. I have my tools to manage my pain.    Patient Stated Goals work on back pain and eliminate pain with sex, reduce burning.    Currently in Pain? Yes    Pain Score 4     Pain Location Pelvis    Pain Orientation Mid    Pain Descriptors / Indicators Burning;Cramping    Pain Type Acute pain    Pain Onset More than a month ago    Pain Frequency Intermittent    Aggravating Factors  burning with urination    Pain Relieving Factors not urinating     Multiple Pain Sites No              [facility] PT Assessment - 06/16/20 0001      Assessment   Medical Diagnosis chronic cystitis(w/o) N30.20; Dysuria R30.0; Pelvic/Perineal pain R10.2; Urinary Frequency R35.0    Referring Provider (PT) Valentino Nose Fredrich Romans, NP    Onset Date/Surgical Date --   12/26/2019   Prior Therapy yes      Precautions   Precautions None      Restrictions   Weight Bearing Restrictions No      Home Environment   Living Environment Private residence      Prior Function   Level of Independence Independent    Vocation Student    Leisure Modeling but taking a break for school      Cognition   Overall Cognitive Status Within Functional Limits for tasks assessed      Posture/Postural Control   Posture/Postural Control Postural limitations    Postural Limitations Forward head;Rounded Shoulders;Increased thoracic kyphosis      AROM   Overall AROM Comments Lumbar ROM is full      Strength   Overall Strength Comments middle and lower  trap bil. 4/5  Right Hip ABduction 4+/5    Right Hip ADduction 5/5    Left Hip External Rotation 5/5    Left Hip ABduction 5/5    Left Hip ADduction 5/5                      Pelvic Floor Special Questions - 06/16/20 0001    Urinary Leakage No    Urinary urgency Yes    Urinary frequency 2 times at night    Strength good squeeze, good lift, able to hold agaisnt strong resistance             [facility] Adult PT Treatment/Exercise - 06/16/20 0001      Balance Poses: Yoga   Warrior I 2 reps;15 seconds   both ways   Warrior II 2 reps;15 seconds   both ways     Lumbar Exercises: Supine   Bent Knee Raise 15 reps;1 second    Bent Knee Raise Limitations on each leg      Shoulder Exercises: Supine   Horizontal ABduction Strengthening;Both;15 reps;Theraband    Theraband Level (Shoulder Horizontal ABduction) Level 3 (Green)    Diagonals Strengthening;Both;15 reps;Theraband    Theraband Level (Shoulder Diagonals)  Level 3 (Green)      Shoulder Exercises: Standing   Other Standing Exercises standing hinging at hips with a cane to keep trunk erect 10x, then squat 10x hold  sec                  PT Education - 06/16/20 1057    Education Details Access Code: [email or web address]    Person(s) Educated Patient    Methods Explanation;Demonstration;Verbal cues;Handout    Comprehension Verbalized understanding;Returned demonstration            PT Short Term Goals - 05/18/20 1207      PT SHORT TERM GOAL #1   Title independent with initial HEP    Time 4    Period Weeks    Status Achieved    Target Date 03/30/20             PT Long Term Goals - 06/16/20 1059      PT LONG TERM GOAL #1   Title independent with HEP    Time 12    Period Weeks    Status Achieved      PT LONG TERM GOAL #2   Title improved posture to reduce the thoracic kyphosis so she has reduction in back pain to </= 1/10    Time 12    Period Weeks    Status Achieved      PT LONG TERM GOAL #3   Title burning with urination decreased to </= 1/10 due to improved vaginal health and elongation of tissue    Time 12    Period Weeks    Status Achieved                 Plan - 06/16/20 1101    Clinical Impression Statement Patient has her pain under control. She understands how to manage her pain when she has a flare-up with foam rolling, using her pelvic wand,exercises, and eating certain foods. Patient has increased in hip strength. She has a home program that will progress her pelvic and core strength. Patient has improved her posture by standing upright. Patient has learned how to hinge at her hips instead of flexing at the thoracic lumbar junction. Patient is only getting up 2 times at night. Patient is not urinating as  frequently and has less burning with urination. Patient is ready for discharge.    Personal Factors and Comorbidities Sex;Comorbidity 1    Comorbidities Interstitial Cystitis    Examination-Activity  Limitations Toileting;Sit    Examination-Participation Restrictions Interpersonal Relationship;Community Activity    Stability/Clinical Decision Making Stable/Uncomplicated    Rehab Potential Good    PT Treatment/Interventions ADLs/Self Care Home Management;Biofeedback;Cryotherapy;Electrical Stimulation;Moist Heat;Ultrasound;Neuromuscular re-education;Therapeutic exercise;Therapeutic activities;Patient/family education;Manual techniques;Dry needling;Spinal Manipulations    PT Next Visit Plan Discharge to HEP    PT Home Exercise Plan Access Code: [email or web address]    Consulted and Agree with Plan of Care Patient           Patient will benefit from skilled therapeutic intervention in order to improve the following deficits and impairments:  Decreased coordination,Decreased range of motion,Increased fascial restricitons,Increased muscle spasms,Pain,Decreased strength  Visit Diagnosis: Muscle weakness (generalized)  Cramp and spasm  Abnormal posture     Problem List Patient Active Problem List   Diagnosis Date Noted  . Acute encephalopathy   . Gabapentin overdose   . Chronic respiratory failure ([place])   . Overdose 04/09/2020  . MDD (major depressive disorder), recurrent severe, without psychosis ([place]) 04/19/2018  . Suicide attempt by benzodiazepine overdose ([place]) 04/19/2018    Earlie Counts, PT 06/16/20 11:06 AM   [facility] [address], [place], [place], [address] Phone: [phone]   Fax:  [phone]  Name: Alexis [name] MRN: [id number] Date of Birth: 2000/08/25  PHYSICAL THERAPY DISCHARGE SUMMARY  Visits from Start of Care: 11  Current functional level related to goals / functional outcomes: See above.    Remaining deficits: See above.    Education / Equipment: HEP Plan: Patient agrees to discharge.  Patient goals were met. Patient is being discharged due to meeting the stated rehab goals.  Thank you for  the referral. Earlie Counts, PT 06/16/20 11:07 AM  ?????

## 2020-06-21 ENCOUNTER — Ambulatory Visit
Admission: EM | Admit: 2020-06-21 | Discharge: 2020-06-21 | Disposition: A | Discharge status: Home / Self Care | Payer: Medicaid Other | Attending: Urgent Care | Admitting: Urgent Care

## 2020-06-21 ENCOUNTER — Encounter: Payer: Self-pay | Admitting: Emergency Medicine

## 2020-06-21 ENCOUNTER — Other Ambulatory Visit: Payer: Self-pay

## 2020-06-21 DIAGNOSIS — B373 Candidiasis of vulva and vagina: Secondary | ICD-10-CM | POA: Diagnosis present

## 2020-06-21 DIAGNOSIS — B3731 Acute candidiasis of vulva and vagina: Secondary | ICD-10-CM

## 2020-06-21 LAB — POCT URINALYSIS DIP (MANUAL ENTRY)
Bilirubin, UA: NEGATIVE
Blood, UA: NEGATIVE
Glucose, UA: NEGATIVE mg/dL
Ketones, POC UA: NEGATIVE mg/dL
Leukocytes, UA: NEGATIVE
Nitrite, UA: NEGATIVE
Protein Ur, POC: NEGATIVE mg/dL
Spec Grav, UA: 1.02 (ref 1.010–1.025)
Urobilinogen, UA: 0.2 E.U./dL
pH, UA: 7 (ref 5.0–8.0)

## 2020-06-21 LAB — POCT URINE PREGNANCY: Preg Test, Ur: NEGATIVE

## 2020-06-21 MED ORDER — FLUCONAZOLE 150 MG PO TABS: 150.0000 mg | ORAL_TABLET | ORAL | 0 refills | Status: DC

## 2020-06-21 NOTE — ED Provider Notes (Signed)
[facility]   MRN: [id number] DOB: 12-02-2000  Subjective:   Alexis [name] is a 20 y.o. female presenting for 4-day history of vaginal irritation, discharge.  She just finished Keflex for a UTI, thinks that she got yeast infection from this.  Has also had some dysuria.  No current facility-administered medications for this encounter.  Current Outpatient Medications:  .  albuterol (PROVENTIL HFA;VENTOLIN HFA) 108 (90 Base) MCG/ACT inhaler, Inhale 1-2 puffs into the lungs every 6 (six) hours as needed for wheezing or shortness of breath., Disp: , Rfl:  .  diazepam (VALIUM) 5 MG tablet, Take 1 tablet (5 mg total) by mouth every 8 (eight) hours. (Patient not taking: Reported on 06/21/2020), Disp: 5 tablet, Rfl: 0 .  famotidine (PEPCID) 20 MG tablet, Take 1 tablet (20 mg total) by mouth 2 (two) times daily., Disp: 60 tablet, Rfl: 0 .  fluticasone (FLOVENT HFA) 110 MCG/ACT inhaler, Inhale 1 puff into the lungs 2 (two) times daily. , Disp: , Rfl:  .  gabapentin (NEURONTIN) 100 MG capsule, Take 1 capsule (100 mg total) by mouth as needed (anxiety)., Disp: 15 capsule, Rfl: 0 .  hydrOXYzine (ATARAX/VISTARIL) 25 MG tablet, Take 1 tablet (25 mg total) by mouth every 8 (eight) hours as needed for anxiety., Disp: 60 tablet, Rfl: 0 .  lamoTRIgine (LAMICTAL) 100 MG tablet, Take 1 tablet (100 mg total) by mouth daily., Disp: 30 tablet, Rfl: 0 .  Norgestimate-Ethinyl Estradiol Triphasic (TRI-LO-MARZIA) 0.18/0.215/0.25 MG-25 MCG tab, Take 1 tablet by mouth at bedtime. Birth control method, Disp: 1 Package, Rfl: 11 .  phenazopyridine (PYRIDIUM) 100 MG tablet, Take 1 tablet (100 mg total) by mouth 3 (three) times daily. For bladder spasms (Patient taking differently: Take 100 mg by mouth as needed for pain (bladder spasms).), Disp: 15 tablet, Rfl: 0 .  polyethylene glycol (MIRALAX / GLYCOLAX) 17 g packet, Take 17 g by mouth daily as needed for mild constipation., Disp: 14 each, Rfl: 0 .   QUEtiapine Fumarate (SEROQUEL PO), Take 25 mg by mouth., Disp: , Rfl:    Allergies  Allergen Reactions  . Other Other (See Comments)    All acidic foods cause extreme bladder pain  . Tomato Other (See Comments)    Causes burning and bladder issues     Past Medical History:  Diagnosis Date  . Anxiety   . Asthma   . Bipolar disorder (HCC)    per pt  . Depression   . GERD (gastroesophageal reflux disease)   . Migraines   . Pelvic floor dysfunction   . UTI (urinary tract infection)      Past Surgical History:  Procedure Laterality Date  . CYSTO WITH HYDRODISTENSION N/A 04/10/2018   Procedure: CYSTOSCOPY/HYDRODISTENSION;  Surgeon: Crist Fat, MD;  Location: [facility];  Service: Urology;  Laterality: N/A;  . UPPER GI ENDOSCOPY      Family History  Problem Relation Age of Onset  . Asthma Mother   . Thyroid disease Mother   . Healthy Sister     Social History   Tobacco Use  . Smoking status: Never Smoker  . Smokeless tobacco: Never Used  Vaping Use  . Vaping Use: Never used  Substance Use Topics  . Alcohol use: Yes    Comment: socially  . Drug use: Never    ROS   Objective:   Vitals: BP 117/61 (BP Location: Right Arm)   Pulse (!) 106   Temp 98.6 F (37 C) (Oral)  Resp 18   SpO2 98%   Physical Exam Constitutional:      General: She is not in acute distress.    Appearance: Normal appearance. She is well-developed. She is not ill-appearing.  HENT:     Head: Normocephalic and atraumatic.     Nose: Nose normal.     Mouth/Throat:     Mouth: Mucous membranes are moist.     Pharynx: Oropharynx is clear.  Eyes:     General: No scleral icterus.    Extraocular Movements: Extraocular movements intact.     Pupils: Pupils are equal, round, and reactive to light.  Cardiovascular:     Rate and Rhythm: Normal rate.  Pulmonary:     Effort: Pulmonary effort is normal.  Skin:    General: Skin is warm and dry.  Neurological:      General: No focal deficit present.     Mental Status: She is alert and oriented to person, place, and time.  Psychiatric:        Mood and Affect: Mood normal.        Behavior: Behavior normal.     Results for orders placed or performed during the hospital encounter of 06/21/20 (from the past 24 hour(s))  POCT urine pregnancy     Status: None   Collection Time: 06/21/20  4:53 PM  Result Value Ref Range   Preg Test, Ur Negative Negative  POCT urinalysis dipstick     Status: None   Collection Time: 06/21/20  4:53 PM  Result Value Ref Range   Color, UA yellow yellow   Clarity, UA clear clear   Glucose, UA negative negative mg/dL   Bilirubin, UA negative negative   Ketones, POC UA negative negative mg/dL   Spec Grav, UA 1.194 1.740 - 1.025   Blood, UA negative negative   pH, UA 7.0 5.0 - 8.0   Protein Ur, POC negative negative mg/dL   Urobilinogen, UA 0.2 0.2 or 1.0 E.U./dL   Nitrite, UA Negative Negative   Leukocytes, UA Negative Negative    Assessment and Plan :   PDMP not reviewed this encounter.  1. Yeast vaginitis     Start Diflucan, cervical swab pending. Counseled patient on potential for adverse effects with medications prescribed/recommended today, ER and return-to-clinic precautions discussed, patient verbalized understanding.    Wallis Bamberg, [place] 06/21/20 1702

## 2020-06-21 NOTE — ED Triage Notes (Signed)
Pt here for vaginal discharge and irritation x 4 days; pt sts recently completed antibiotics; pt sts some dysuria

## 2020-06-22 LAB — CERVICOVAGINAL ANCILLARY ONLY
Bacterial Vaginitis (gardnerella): NEGATIVE
Candida Glabrata: NEGATIVE
Candida Vaginitis: NEGATIVE
Chlamydia: NEGATIVE
Comment: NEGATIVE
Comment: NEGATIVE
Comment: NEGATIVE
Comment: NEGATIVE
Comment: NEGATIVE
Comment: NORMAL
Neisseria Gonorrhea: NEGATIVE
Trichomonas: NEGATIVE

## 2020-06-23 ENCOUNTER — Encounter: Payer: Medicaid Other | Admitting: Physical Therapy

## 2020-06-30 ENCOUNTER — Encounter: Payer: Medicaid Other | Admitting: Physical Therapy

## 2020-07-07 ENCOUNTER — Encounter: Payer: Medicaid Other | Admitting: Physical Therapy

## 2020-07-21 ENCOUNTER — Encounter: Payer: Medicaid Other | Admitting: Physical Therapy

## 2020-07-25 ENCOUNTER — Ambulatory Visit: Payer: Self-pay

## 2020-07-25 ENCOUNTER — Ambulatory Visit
Admission: RE | Admit: 2020-07-25 | Discharge: 2020-07-25 | Disposition: A | Discharge status: Home / Self Care | Payer: Medicaid Other | Source: Ambulatory Visit | Attending: Urgent Care | Admitting: Urgent Care

## 2020-07-25 ENCOUNTER — Other Ambulatory Visit: Payer: Self-pay

## 2020-07-25 VITALS — BP 110/73 | HR 100 | Temp 98.5°F | Resp 16

## 2020-07-25 DIAGNOSIS — B373 Candidiasis of vulva and vagina: Secondary | ICD-10-CM | POA: Insufficient documentation

## 2020-07-25 DIAGNOSIS — B3731 Acute candidiasis of vulva and vagina: Secondary | ICD-10-CM

## 2020-07-25 LAB — POCT URINE PREGNANCY: Preg Test, Ur: NEGATIVE

## 2020-07-25 MED ORDER — FLUCONAZOLE 150 MG PO TABS: 150.0000 mg | ORAL_TABLET | ORAL | 0 refills | Status: DC

## 2020-07-25 NOTE — ED Provider Notes (Signed)
[facility]   MRN: [id number] DOB: [birth year]  Subjective:   [name] is a 20 y.o. female presenting for 2-day history of acute onset vaginal itching, burning, vaginal discharge.  Patient just finished 1 week course of antibiotics for recurrent UTI.  Denies fever, nausea, vomiting.  Urinary symptoms are improved.  She does have a history of pelvic floor dysfunction and is working with urologist on this.  No current facility-administered medications for this encounter.  Current Outpatient Medications:  .  albuterol (PROVENTIL HFA;VENTOLIN HFA) 108 (90 Base) MCG/ACT inhaler, Inhale 1-2 puffs into the lungs every 6 (six) hours as needed for wheezing or shortness of breath., Disp: , Rfl:  .  diazepam (VALIUM) 5 MG tablet, Take 1 tablet (5 mg total) by mouth every 8 (eight) hours. (Patient not taking: Reported on 06/21/2020), Disp: 5 tablet, Rfl: 0 .  famotidine (PEPCID) 20 MG tablet, Take 1 tablet (20 mg total) by mouth 2 (two) times daily., Disp: 60 tablet, Rfl: 0 .  fluconazole (DIFLUCAN) 150 MG tablet, Take 1 tablet (150 mg total) by mouth once a week., Disp: 2 tablet, Rfl: 0 .  fluticasone (FLOVENT HFA) 110 MCG/ACT inhaler, Inhale 1 puff into the lungs 2 (two) times daily. , Disp: , Rfl:  .  gabapentin (NEURONTIN) 100 MG capsule, Take 1 capsule (100 mg total) by mouth as needed (anxiety)., Disp: 15 capsule, Rfl: 0 .  hydrOXYzine (ATARAX/VISTARIL) 25 MG tablet, Take 1 tablet (25 mg total) by mouth every 8 (eight) hours as needed for anxiety., Disp: 60 tablet, Rfl: 0 .  lamoTRIgine (LAMICTAL) 100 MG tablet, Take 1 tablet (100 mg total) by mouth daily., Disp: 30 tablet, Rfl: 0 .  Norgestimate-Ethinyl Estradiol Triphasic (TRI-LO-MARZIA) 0.18/0.215/0.25 MG-25 MCG tab, Take 1 tablet by mouth at bedtime. Birth control method, Disp: 1 Package, Rfl: 11 .  phenazopyridine (PYRIDIUM) 100 MG tablet, Take 1 tablet (100 mg total) by mouth 3 (three) times daily. For bladder spasms (Patient  taking differently: Take 100 mg by mouth as needed for pain (bladder spasms).), Disp: 15 tablet, Rfl: 0 .  polyethylene glycol (MIRALAX / GLYCOLAX) 17 g packet, Take 17 g by mouth daily as needed for mild constipation., Disp: 14 each, Rfl: 0 .  QUEtiapine Fumarate (SEROQUEL PO), Take 25 mg by mouth., Disp: , Rfl:    Allergies  Allergen Reactions  . Other Other (See Comments)    All acidic foods cause extreme bladder pain  . Tomato Other (See Comments)    Causes burning and bladder issues     Past Medical History:  Diagnosis Date  . Anxiety   . Asthma   . Bipolar disorder (HCC)    per pt  . Depression   . GERD (gastroesophageal reflux disease)   . Migraines   . Pelvic floor dysfunction   . UTI (urinary tract infection)      Past Surgical History:  Procedure Laterality Date  . CYSTO WITH HYDRODISTENSION N/A 04/10/2018   Procedure: CYSTOSCOPY/HYDRODISTENSION;  Surgeon: Crist Fat, MD;  Location: [facility];  Service: Urology;  Laterality: N/A;  . UPPER GI ENDOSCOPY      Family History  Problem Relation Age of Onset  . Asthma Mother   . Thyroid disease Mother   . Healthy Sister     Social History   Tobacco Use  . Smoking status: Never Smoker  . Smokeless tobacco: Never Used  Vaping Use  . Vaping Use: Never used  Substance Use Topics  .  Alcohol use: Yes    Comment: socially  . Drug use: Never    ROS   Objective:   Vitals: BP 110/73   Pulse 100   Temp 98.5 F (36.9 C) (Oral)   Resp 16   SpO2 96%   Physical Exam Constitutional:      General: She is not in acute distress.    Appearance: Normal appearance. She is well-developed. She is not ill-appearing, toxic-appearing or diaphoretic.  HENT:     Head: Normocephalic and atraumatic.     Nose: Nose normal.     Mouth/Throat:     Mouth: Mucous membranes are moist.     Pharynx: Oropharynx is clear.  Eyes:     General: No scleral icterus.       Right eye: No discharge.         Left eye: No discharge.     Extraocular Movements: Extraocular movements intact.     Conjunctiva/sclera: Conjunctivae normal.     Pupils: Pupils are equal, round, and reactive to light.  Cardiovascular:     Rate and Rhythm: Normal rate.  Pulmonary:     Effort: Pulmonary effort is normal.  Abdominal:     Tenderness: There is no right CVA tenderness or left CVA tenderness.  Skin:    General: Skin is warm and dry.  Neurological:     General: No focal deficit present.     Mental Status: She is alert and oriented to person, place, and time.  Psychiatric:        Mood and Affect: Mood normal.        Behavior: Behavior normal.        Thought Content: Thought content normal.        Judgment: Judgment normal.     Results for orders placed or performed during the hospital encounter of 07/25/20 (from the past 24 hour(s))  POCT urine pregnancy     Status: None   Collection Time: 07/25/20  7:04 PM  Result Value Ref Range   Preg Test, Ur Negative Negative    Assessment and Plan :   PDMP not reviewed this encounter.  1. Yeast vaginitis     Will cover for antibiotic associated yeast vaginitis with Diflucan.  Labs pending. Counseled patient on potential for adverse effects with medications prescribed/recommended today, ER and return-to-clinic precautions discussed, patient verbalized understanding.    Wallis Bamberg, [place] 07/25/20 1922

## 2020-07-25 NOTE — ED Triage Notes (Signed)
Pt c/o vaginal discomfort with burning and white secretion around vaginal x2 days. States just finished a wk of antibiotics for an UTI.

## 2020-07-27 LAB — CERVICOVAGINAL ANCILLARY ONLY

## 2021-01-04 ENCOUNTER — Ambulatory Visit: Admit: 2021-01-04 | Payer: Medicaid Other | Source: Home / Self Care

## 2021-01-04 ENCOUNTER — Encounter: Payer: Self-pay | Admitting: Emergency Medicine

## 2021-01-04 ENCOUNTER — Ambulatory Visit
Admission: EM | Admit: 2021-01-04 | Discharge: 2021-01-04 | Disposition: A | Discharge status: Home / Self Care | Payer: Medicaid Other | Attending: Emergency Medicine | Admitting: Emergency Medicine

## 2021-01-04 DIAGNOSIS — B3731 Acute candidiasis of vulva and vagina: Secondary | ICD-10-CM

## 2021-01-04 DIAGNOSIS — B373 Candidiasis of vulva and vagina: Secondary | ICD-10-CM | POA: Insufficient documentation

## 2021-01-04 LAB — POCT URINALYSIS DIP (MANUAL ENTRY)
Bilirubin, UA: NEGATIVE
Glucose, UA: NEGATIVE mg/dL
Ketones, POC UA: NEGATIVE mg/dL
Leukocytes, UA: NEGATIVE
Nitrite, UA: NEGATIVE
Protein Ur, POC: NEGATIVE mg/dL
Spec Grav, UA: 1.025 (ref 1.010–1.025)
Urobilinogen, UA: 0.2 E.U./dL
pH, UA: 6.5 (ref 5.0–8.0)

## 2021-01-04 LAB — POCT URINE PREGNANCY: Preg Test, Ur: NEGATIVE

## 2021-01-04 MED ORDER — FLUCONAZOLE 150 MG PO TABS: 150.0000 mg | ORAL_TABLET | Freq: Every day | ORAL | 0 refills | Status: AC

## 2021-01-04 NOTE — Discharge Instructions (Signed)
Your testing has been sent to the lab.  If your testing results as positive, you will be notified via phone and we will initiate treatment at that time.  If you do not receive a phone call from us within the next 2 to 3 days, check your MyChart for updated health information. Do not engage in sex until you know your results. If you test positive you will need to abstain from sex for 7 days and notify all partners.   Take your first Diflucan today and your second Diflucan in the next 3 to 5 days if symptoms do not improve.  A vaginal yeast infection is caused by too many yeast cells in the vagina. This is common in women of all ages. Itching, vaginal discharge and irritation, and other symptoms can bother you. But yeast infections don't often cause other health problems.  Some medicines can increase your risk of getting a yeast infection. These include antibiotics, birth control pills, hormones, and steroids. You may also be more likely to get a yeast infection if you are pregnant, have diabetes, douche, or wear tight clothes.  With treatment, most yeast infections get better in 2 to 3 days.  Do not use tampons while using a vaginal cream or suppository. The tampons can absorb the medicine. Use pads instead. Wear loose cotton clothing. Do not wear nylon or other fabric that holds body heat and moisture close to the skin. Try sleeping without underwear. Do not scratch. Relieve itching with a cold pack or a cool bath. Do not wash your vaginal area more than once a day. Use plain water or a mild, unscented soap. Air-dry the vaginal area. Change out of wet swimsuits after swimming. Do not have sex until you have finished your treatment. Do not douche  Return to clinic if you have unexpected vaginal bleeding or if there is new or increased pain in your pelvis. Follow-up with your gynecologist if you have symptoms that return after treatment or if you have recurrent infections.   

## 2021-01-04 NOTE — ED Provider Notes (Addendum)
Subjective:    [name] is a very pleasant 20 y.o. female who presents with concerns for yeast infection due to vaginal itching.  Patient does not report any discharge, but states that this feels similar to previous episodes of yeast.  Patient is requesting a urinalysis to rule out UTI, but does not report any dysuria.  Patient does report some "discomfort" to her vaginal region. No unilateral back pain, vomiting, fever, vaginal discharge, concern for STD.  Past medical history, past surgical history, current medications reviewed.  Allergies: is allergic to other and tomato.  Review of Systems See HPI   Objective:     Vitals:   01/04/21 1828  BP: 119/78  Pulse: 87  Resp: 16  Temp: 98.9 F (37.2 C)  SpO2: 96%     General: Appears well-developed and well-nourished. No acute distress.  Cardiovascular: Normal rate  Pulm/Chest: No respiratory distress.  Neurological: Alert and oriented to person, place, and time.  Skin: Skin is warm and dry.  Psychiatric: Normal mood, affect, behavior, and thought content.  GU:  Deferred secondary to self collect specimen.  Laboratory:  Orders Placed This Encounter  Procedures   POCT urine pregnancy   POCT urinalysis dipstick   Results for orders placed or performed during the hospital encounter of 01/04/21  POCT urine pregnancy  Result Value Ref Range   Preg Test, Ur Negative Negative  POCT urinalysis dipstick  Result Value Ref Range   Color, UA yellow yellow   Clarity, UA clear clear   Glucose, UA negative negative mg/dL   Bilirubin, UA negative negative   Ketones, POC UA negative negative mg/dL   Spec Grav, UA 9.628 3.662 - 1.025   Blood, UA trace-intact (A) negative   pH, UA 6.5 5.0 - 8.0   Protein Ur, POC negative negative mg/dL   Urobilinogen, UA 0.2 0.2 or 1.0 E.U./dL   Nitrite, UA Negative Negative   Leukocytes, UA Negative Negative    -Pregnancy negative -Urinalysis reveals trace blood, otherwise  normal.  Assessment:   1. Vaginal yeast infection - Cervicovaginal ancillary only; Standing - Cervicovaginal ancillary only - fluconazole (DIFLUCAN) 150 MG tablet; Take 1 tablet (150 mg total) by mouth daily for 1 day.  Dispense: 2 tablet; Refill: 0  Plan:   MDM: Patient presents with concerns for yeast infection due to vaginal itching.  Patient does not report any discharge, but states that this feels similar to previous episodes of yeast.  Patient is requesting a urinalysis to rule out UTI, but does not report any dysuria.  Patient does report some "discomfort" to her vaginal region. No unilateral back pain, vomiting, fever, vaginal discharge, concern for STD.  Chart review completed.  Pregnancy negative.  Urinalysis reveals trace blood, otherwise normal.  No concern for UTI at this time.  APTIMA swab pending.  Rx'd Diflucan to the patient's preferred pharmacy given her symptoms in clinic today are consistent with a vaginal yeast infection and patient states that this is typical for her.  Advised to take her first Diflucan today and her next within the next 3 to 5 days if she does not improve significantly from her first dose.  Advised that we will call with any positive results from her APTIMA swab for which will require additional treatment.  Patient verbalized understanding and agreed with plan.  Patient stable upon discharge.  Return as needed.    Discharge Instructions      Your testing has been sent to the lab.  If your  testing results as positive, you will be notified via phone and we will initiate treatment at that time.  If you do not receive a phone call from [place] within the next 2 to 3 days, check your MyChart for updated health information. Do not engage in sex until you know your results. If you test positive you will need to abstain from sex for 7 days and notify all partners.   Take your first Diflucan today and your second Diflucan in the next 3 to 5 days if symptoms do not  improve.  A vaginal yeast infection is caused by too many yeast cells in the vagina. This is common in women of all ages. Itching, vaginal discharge and irritation, and other symptoms can bother you. But yeast infections don't often cause other health problems.  Some medicines can increase your risk of getting a yeast infection. These include antibiotics, birth control pills, hormones, and steroids. You may also be more likely to get a yeast infection if you are pregnant, have diabetes, douche, or wear tight clothes.  With treatment, most yeast infections get better in 2 to 3 days.  Do not use tampons while using a vaginal cream or suppository. The tampons can absorb the medicine. Use pads instead. Wear loose cotton clothing. Do not wear nylon or other fabric that holds body heat and moisture close to the skin. Try sleeping without underwear. Do not scratch. Relieve itching with a cold pack or a cool bath. Do not wash your vaginal area more than once a day. Use plain water or a mild, unscented soap. Air-dry the vaginal area. Change out of wet swimsuits after swimming. Do not have sex until you have finished your treatment. Do not douche  Return to clinic if you have unexpected vaginal bleeding or if there is new or increased pain in your pelvis. Follow-up with your gynecologist if you have symptoms that return after treatment or if you have recurrent infections.           Amalia Greenhouse, FNP 01/04/21 1856    Amalia Greenhouse, FNP 01/04/21 1857

## 2021-01-04 NOTE — ED Triage Notes (Signed)
Pt presents today with c/o of vaginal itching x 2 days. Denies vaginal discharge. Denies dysuria.

## 2021-01-05 LAB — CERVICOVAGINAL ANCILLARY ONLY
Bacterial Vaginitis (gardnerella): NEGATIVE
Candida Glabrata: NEGATIVE
Candida Vaginitis: POSITIVE — AB
Chlamydia: NEGATIVE
Comment: NEGATIVE
Comment: NEGATIVE
Comment: NEGATIVE
Comment: NEGATIVE
Comment: NEGATIVE
Comment: NORMAL
Neisseria Gonorrhea: NEGATIVE
Trichomonas: NEGATIVE

## 2021-01-15 ENCOUNTER — Ambulatory Visit
Admission: RE | Admit: 2021-01-15 | Discharge: 2021-01-15 | Disposition: A | Discharge status: Home / Self Care | Payer: Medicaid Other | Source: Ambulatory Visit | Attending: Emergency Medicine | Admitting: Emergency Medicine

## 2021-01-15 ENCOUNTER — Other Ambulatory Visit: Payer: Self-pay

## 2021-01-15 VITALS — BP 112/77 | HR 103 | Temp 98.6°F | Resp 14

## 2021-01-15 DIAGNOSIS — N390 Urinary tract infection, site not specified: Secondary | ICD-10-CM | POA: Insufficient documentation

## 2021-01-15 DIAGNOSIS — R1030 Lower abdominal pain, unspecified: Secondary | ICD-10-CM | POA: Diagnosis present

## 2021-01-15 DIAGNOSIS — R319 Hematuria, unspecified: Secondary | ICD-10-CM | POA: Insufficient documentation

## 2021-01-15 LAB — POCT URINALYSIS DIP (MANUAL ENTRY)
Glucose, UA: 250 mg/dL — AB
Nitrite, UA: POSITIVE — AB
Protein Ur, POC: 100 mg/dL — AB
Spec Grav, UA: 1.02 (ref 1.010–1.025)
Urobilinogen, UA: 4 E.U./dL — AB
pH, UA: 5 (ref 5.0–8.0)

## 2021-01-15 LAB — POCT URINE PREGNANCY: Preg Test, Ur: NEGATIVE

## 2021-01-15 MED ORDER — CEPHALEXIN 500 MG PO CAPS: 500.0000 mg | ORAL_CAPSULE | Freq: Two times a day (BID) | ORAL | 0 refills | Status: AC

## 2021-01-15 NOTE — Discharge Instructions (Addendum)
Take the antibiotic as directed.  The urine culture is pending.  We will call you if it shows the need to change or discontinue your antibiotic.   ° °Your vaginal tests are pending.  If your test results are positive, we will call you.  Do not have sexual activity for at least 7 days.   ° °Follow up with your primary care provider if your symptoms are not improving.   ° °

## 2021-01-15 NOTE — ED Provider Notes (Signed)
[name]    CSN: [id number] Arrival date & time: 01/15/21  1741      History   Chief Complaint Chief Complaint  Patient presents with   Dysuria   Urinary Frequency   APPT 1800     HPI [name] is a 20 y.o. female.  Patient presents with 1 week history of dysuria and urinary frequency.  She also reports lower abdominal pain.  Treatment attempted at home with Azo.  She denies fever, chills, flank pain, vaginal discharge, pelvic pain, nausea, vomiting, diarrhea, or other symptoms.  Her medical history includes asthma, GERD, migraines, bipolar disorder, depression, anxiety, suicide attempt.  The history is provided by the patient and medical records.   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      Home Medications    Prior to Admission medications   Medication Sig Start Date End Date Taking? Authorizing Provider  ADVAIR DISKUS 250-50 MCG/ACT AEPB SMARTSIG:1 Puff(s) By Mouth 12/20/20  Yes [provider]  cephALEXin (KEFLEX) 500 MG capsule Take 1 capsule (500 mg total) by mouth 2 (two) times daily for 5 days. 01/15/21 01/20/21 Yes Mickie Bail, NP  cetirizine (ZYRTEC) 10 MG tablet Take 10 mg by mouth daily. 11/01/20  Yes [provider]  lamoTRIgine (LAMICTAL) 100 MG tablet Take 1 tablet (100 mg total) by mouth daily. 04/16/20  Yes Antonieta Pert, MD  Norgestimate-Ethinyl Estradiol Triphasic (TRI-LO-MARZIA) 0.18/0.215/0.25 MG-25 MCG tab Take 1 tablet by mouth at bedtime. Birth control method 04/22/18  Yes Armandina Stammer I, NP  phenazopyridine (PYRIDIUM) 100 MG tablet Take 1 tablet (100 mg total) by mouth 3 (three) times daily. For bladder spasms Patient taking differently: Take 100 mg by mouth as needed for pain (bladder spasms). 04/22/18  Yes Armandina Stammer I, NP  QUEtiapine Fumarate (SEROQUEL PO) Take 25 mg by mouth.   Yes [provider]  albuterol (PROVENTIL HFA;VENTOLIN HFA) 108 (90 Base) MCG/ACT inhaler Inhale 1-2 puffs into the lungs every 6 (six) hours as needed for wheezing or shortness of breath. 04/22/18   Armandina Stammer I, NP  diazepam (VALIUM) 5 MG tablet Take 1 tablet (5 mg total) by mouth every 8 (eight) hours. Patient not taking: No sig reported 04/16/20   Antonieta Pert, MD  famotidine (PEPCID) 20 MG tablet Take 1 tablet (20 mg total) by mouth 2 (two) times daily. 04/16/20   Antonieta Pert, MD  fluticasone (FLOVENT HFA) 110 MCG/ACT inhaler Inhale 1 puff into the lungs 2 (two) times daily.  05/03/19   [provider]  gabapentin (  NEURONTIN) 100 MG capsule Take 1 capsule (100 mg total) by mouth as needed (anxiety). 04/16/20   Antonieta Pert, MD  hydrOXYzine (ATARAX/VISTARIL) 25 MG tablet Take 1 tablet (25 mg total) by mouth every 8 (eight) hours as needed for anxiety. 04/16/20   Antonieta Pert, MD  polyethylene glycol (MIRALAX / GLYCOLAX) 17 g packet Take 17 g by mouth daily as needed for mild constipation. 04/16/20   Antonieta Pert, MD    Family History Family History  Problem Relation Age of Onset   Asthma Mother    Thyroid disease Mother    Healthy Sister      Social History Social History   Tobacco Use   Smoking status: Never   Smokeless tobacco: Never  Vaping Use   Vaping Use: Never used  Substance Use Topics   Alcohol use: Yes    Comment: socially   Drug use: Never     Allergies   Other and Tomato   Review of Systems Review of Systems  Constitutional:  Negative for chills and fever.  Respiratory:  Negative for cough and shortness of breath.   Cardiovascular:  Negative for chest pain and palpitations.  Gastrointestinal:  Negative for abdominal pain, diarrhea, nausea and vomiting.  Genitourinary:  Positive for dysuria and frequency. Negative for flank pain, hematuria, pelvic pain and vaginal discharge.  Skin:  Negative for color change and rash.  All other systems reviewed and are negative.   Physical Exam Triage Vital Signs ED Triage Vitals  Enc Vitals Group     BP      Pulse      Resp      Temp      Temp src      SpO2      Weight      Height      Head Circumference      Peak Flow      Pain Score      Pain Loc      Pain Edu?      Excl. in GC?    No data found.  Updated Vital Signs BP 112/77 (BP Location: Left Arm)   Pulse (!) 103   Temp 98.6 F (37 C) (Oral)   Resp 14   LMP 01/01/2021 (Approximate)   SpO2 95%   Visual Acuity Right Eye Distance:   Left Eye Distance:   Bilateral Distance:    Right Eye Near:   Left Eye Near:    Bilateral Near:     Physical Exam Vitals and nursing note reviewed.  Constitutional:      General: She is not in acute distress.    Appearance: She is well-developed. She is not ill-appearing.  HENT:     Head: Normocephalic and atraumatic.     Mouth/Throat:     Mouth: Mucous membranes are moist.  Eyes:     Conjunctiva/sclera: Conjunctivae normal.  Cardiovascular:     Rate and Rhythm: Normal rate and regular rhythm.     Heart sounds: Normal heart sounds.  Pulmonary:     Effort: Pulmonary effort is normal. No respiratory distress.     Breath sounds: Normal  breath sounds.  Abdominal:     General: Bowel sounds are normal.     Palpations: Abdomen is soft.     Tenderness: There is no abdominal tenderness. There is no right CVA tenderness, left CVA tenderness, guarding or rebound.  Musculoskeletal:     Cervical back: Neck supple.  Skin:  General: Skin is warm and dry.  Neurological:     General: No focal deficit present.     Mental Status: She is alert and oriented to person, place, and time.     Gait: Gait normal.  Psychiatric:        Mood and Affect: Mood normal.        Behavior: Behavior normal.     UC Treatments / Results  Labs (all labs ordered are listed, but only abnormal results are displayed) Labs Reviewed  POCT URINALYSIS DIP (MANUAL ENTRY) - Abnormal; Notable for the following components:      Result Value   Color, UA orange (*)    Clarity, UA cloudy (*)    Glucose, UA =250 (*)    Bilirubin, UA small (*)    Ketones, POC UA small (15) (*)    Blood, UA small (*)    Protein Ur, POC =100 (*)    Urobilinogen, UA 4.0 (*)    Nitrite, UA Positive (*)    Leukocytes, UA Large (3+) (*)    All other components within normal limits  URINE CULTURE  POCT URINE PREGNANCY  CERVICOVAGINAL ANCILLARY ONLY    EKG   Radiology No results found.  Procedures Procedures (including critical care time)  Medications Ordered in UC Medications - No data to display  Initial Impression / Assessment and Plan / UC Course  I have reviewed the triage vital signs and the nursing notes.  Pertinent labs & imaging results that were available during my care of the patient were reviewed by me and considered in my medical decision making (see chart for details).   UTI, abdominal pain.   Treating with Keflex. Urine culture pending. Discussed with patient that we will call her if the urine culture shows the need to change or discontinue the antibiotic. Patient obtained vaginal self swab for testing.  Discussed with patient that we will call her if  the vaginal swab is shows the need for treatment.  Instructed her to abstain from sexual activity for at least 7 days.  Instructed her to follow-up with her PCP or OB/GYN if her symptoms are not improving.  Patient agrees to plan of care.   Final Clinical Impressions(s) / UC Diagnoses   Final diagnoses:  Urinary tract infection with hematuria, site unspecified  Lower abdominal pain     Discharge Instructions      Take the antibiotic as directed.  The urine culture is pending.  We will call you if it shows the need to change or discontinue your antibiotic.    Your vaginal tests are pending.  If your test results are positive, we will call you.  Do not have sexual activity for at least 7 days.    Follow up with your primary care provider if your symptoms are not improving.        ED Prescriptions     Medication Sig Dispense Auth. Provider   cephALEXin (KEFLEX) 500 MG capsule Take 1 capsule (500 mg total) by mouth 2 (two) times daily for 5 days. 10 capsule Mickie Bail, NP      PDMP not reviewed this encounter.   Mickie Bail, NP 01/15/21 [phone]

## 2021-01-15 NOTE — ED Triage Notes (Signed)
Patient c/o dysuria and urinary frequency x 1 week.   Patient denies fever, back pain, vaginal discharge, or N/V/D.   Patient endorses RLQ and LLQ ABD pain.   Patient endorses burning w/ urination.   Patient has used AZO w/ no relief of symptoms.   History of Pelvic Floor Dysfunction.

## 2021-01-16 ENCOUNTER — Emergency Department (HOSPITAL_COMMUNITY)
Admission: EM | Admit: 2021-01-16 | Discharge: 2021-01-16 | Disposition: A | Discharge status: Home / Self Care | Payer: Medicaid Other | Attending: Emergency Medicine | Admitting: Emergency Medicine

## 2021-01-16 ENCOUNTER — Emergency Department (HOSPITAL_BASED_OUTPATIENT_CLINIC_OR_DEPARTMENT_OTHER)
Admission: EM | Admit: 2021-01-16 | Discharge: 2021-01-16 | Disposition: A | Discharge status: Home / Self Care | Payer: Medicaid Other | Attending: Emergency Medicine | Admitting: Emergency Medicine

## 2021-01-16 ENCOUNTER — Encounter (HOSPITAL_BASED_OUTPATIENT_CLINIC_OR_DEPARTMENT_OTHER): Payer: Self-pay

## 2021-01-16 ENCOUNTER — Other Ambulatory Visit: Payer: Self-pay

## 2021-01-16 ENCOUNTER — Ambulatory Visit: Admit: 2021-01-16 | Discharge status: Absent without leave | Payer: Medicaid Other

## 2021-01-16 ENCOUNTER — Emergency Department (HOSPITAL_BASED_OUTPATIENT_CLINIC_OR_DEPARTMENT_OTHER): Payer: Medicaid Other

## 2021-01-16 DIAGNOSIS — R0602 Shortness of breath: Secondary | ICD-10-CM | POA: Insufficient documentation

## 2021-01-16 DIAGNOSIS — R000 Tachycardia, unspecified: Secondary | ICD-10-CM | POA: Diagnosis not present

## 2021-01-16 DIAGNOSIS — Z5321 Procedure and treatment not carried out due to patient leaving prior to being seen by health care provider: Secondary | ICD-10-CM | POA: Insufficient documentation

## 2021-01-16 DIAGNOSIS — Z20822 Contact with and (suspected) exposure to covid-19: Secondary | ICD-10-CM | POA: Insufficient documentation

## 2021-01-16 DIAGNOSIS — J45909 Unspecified asthma, uncomplicated: Secondary | ICD-10-CM | POA: Diagnosis not present

## 2021-01-16 DIAGNOSIS — R42 Dizziness and giddiness: Secondary | ICD-10-CM | POA: Insufficient documentation

## 2021-01-16 LAB — BASIC METABOLIC PANEL
Anion gap: 8 (ref 5–15)
BUN: 11 mg/dL (ref 6–20)
CO2: 27 mmol/L (ref 22–32)
Calcium: 8.9 mg/dL (ref 8.9–10.3)
Chloride: 103 mmol/L (ref 98–111)
Creatinine, Ser: 0.53 mg/dL (ref 0.44–1.00)
GFR, Estimated: >60 mL/min (ref >60)
Glucose, Bld: 94 mg/dL (ref 70–99)
Potassium: 3.5 mmol/L (ref 3.5–5.1)
Sodium: 138 mmol/L (ref 135–145)

## 2021-01-16 LAB — CBC WITH DIFFERENTIAL/PLATELET
Abs Immature Granulocytes: 0.02 10*3/uL (ref 0.00–0.07)
Basophils Absolute: 0 10*3/uL (ref 0.0–0.1)
Basophils Relative: 0 %
Eosinophils Absolute: 0 10*3/uL (ref 0.0–0.5)
Eosinophils Relative: 1 %
HCT: 38.9 % (ref 36.0–46.0)
Hemoglobin: 12.8 g/dL (ref 12.0–15.0)
Immature Granulocytes: 0 %
Lymphocytes Relative: 33 %
Lymphs Abs: 1.8 10*3/uL (ref 0.7–4.0)
MCH: 29.8 pg (ref 26.0–34.0)
MCHC: 32.9 g/dL (ref 30.0–36.0)
MCV: 90.7 fL (ref 80.0–100.0)
Monocytes Absolute: 0.5 10*3/uL (ref 0.1–1.0)
Monocytes Relative: 9 %
Neutro Abs: 3.2 10*3/uL (ref 1.7–7.7)
Neutrophils Relative %: 57 %
Platelets: 240 10*3/uL (ref 150–400)
RBC: 4.29 MIL/uL (ref 3.87–5.11)
RDW: 12.2 % (ref 11.5–15.5)
WBC: 5.6 10*3/uL (ref 4.0–10.5)
nRBC: 0 % (ref 0.0–0.2)

## 2021-01-16 LAB — CERVICOVAGINAL ANCILLARY ONLY
Bacterial Vaginitis (gardnerella): NEGATIVE
Candida Glabrata: NEGATIVE
Candida Vaginitis: NEGATIVE
Chlamydia: NEGATIVE
Comment: NEGATIVE
Comment: NEGATIVE
Comment: NEGATIVE
Comment: NEGATIVE
Comment: NEGATIVE
Comment: NORMAL
Neisseria Gonorrhea: NEGATIVE
Trichomonas: NEGATIVE

## 2021-01-16 LAB — D-DIMER, QUANTITATIVE: D-Dimer, Quant: 0.37 ug/mL-FEU (ref 0.00–0.50)

## 2021-01-16 LAB — TROPONIN I (HIGH SENSITIVITY): Troponin I (High Sensitivity): <2 ng/L (ref <18)

## 2021-01-16 LAB — RESP PANEL BY RT-PCR (FLU A&B, COVID) ARPGX2
Influenza A by PCR: NEGATIVE
Influenza B by PCR: NEGATIVE
SARS Coronavirus 2 by RT PCR: NEGATIVE

## 2021-01-16 NOTE — ED Provider Notes (Signed)
MEDCENTER  [facility] EMERGENCY DEPT Provider Note   CSN: [id number] Arrival date & time: 01/16/21  2123     History Chief Complaint  Patient presents with   Shortness of Breath    Alexis [name] is a 20 y.o. female.  Patient presents chief complaint shortness of breath.  She states is been going on for about a week.  She been using albuterol at home without improvement.  She states she has a history of asthma and this feels similar.  Denies fevers.  Positive cough.  No vomiting or diarrhea.  Denies chest pain.      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     Family History  Problem Relation Age of Onset   Asthma Mother    Thyroid disease Mother    Healthy Sister     Social History   Tobacco Use   Smoking status: Never   Smokeless tobacco: Never  Vaping Use   Vaping Use: Never used  Substance Use Topics   Alcohol use: Yes    Comment: socially   Drug use: Never    Home Medications Prior to Admission medications   Medication Sig Start Date End Date Taking? Authorizing Provider  ADVAIR DISKUS 250-50 MCG/ACT AEPB SMARTSIG:1 Puff(s) By Mouth  12/20/20   [provider]  albuterol (PROVENTIL HFA;VENTOLIN HFA) 108 (90 Base) MCG/ACT inhaler Inhale 1-2 puffs into the lungs every 6 (six) hours as needed for wheezing or shortness of breath. 04/22/18   Armandina Stammer I, NP  cephALEXin (KEFLEX) 500 MG capsule Take 1 capsule (500 mg total) by mouth 2 (two) times daily for 5 days. 01/15/21 01/20/21  Mickie Bail, NP  cetirizine (ZYRTEC) 10 MG tablet Take 10 mg by mouth daily. 11/01/20   [provider]  diazepam (VALIUM) 5 MG tablet Take 1 tablet (5 mg total) by mouth every 8 (eight) hours. Patient not taking: No sig reported 04/16/20   Antonieta Pert, MD  famotidine (PEPCID) 20 MG tablet Take 1 tablet (20 mg total) by mouth 2 (two) times daily. 04/16/20   Antonieta Pert, MD  fluticasone (FLOVENT HFA) 110 MCG/ACT inhaler Inhale 1 puff into the lungs 2 (two) times daily.  05/03/19   [provider]  gabapentin (NEURONTIN) 100 MG capsule Take 1 capsule (100 mg total) by mouth as needed (anxiety). 04/16/20   Antonieta Pert, MD  hydrOXYzine (ATARAX/VISTARIL) 25 MG tablet Take 1 tablet (25 mg total) by mouth every 8 (eight) hours as needed for anxiety. 04/16/20   Antonieta Pert, MD  lamoTRIgine (LAMICTAL) 100  MG tablet Take 1 tablet (100 mg total) by mouth daily. 04/16/20   Antonieta Pert, MD  Norgestimate-Ethinyl Estradiol Triphasic (TRI-LO-MARZIA) 0.18/0.215/0.25 MG-25 MCG tab Take 1 tablet by mouth at bedtime. Birth control method 04/22/18   Armandina Stammer I, NP  phenazopyridine (PYRIDIUM) 100 MG tablet Take 1 tablet (100 mg total) by mouth 3 (three) times daily. For bladder spasms Patient taking differently: Take 100 mg by mouth as needed for pain (bladder spasms). 04/22/18   Armandina Stammer I, NP  polyethylene glycol (MIRALAX / GLYCOLAX) 17 g packet Take 17 g by mouth daily as needed for mild constipation. 04/16/20   Antonieta Pert, MD  QUEtiapine Fumarate (SEROQUEL PO) Take 25 mg by mouth.    [provider]    Allergies    Other and Tomato  Review of Systems   Review of Systems  Constitutional:  Negative for fever.  HENT:  Negative for ear pain.   Eyes:  Negative for pain.  Cardiovascular:  Negative for chest pain.  Gastrointestinal:  Negative for abdominal pain.  Genitourinary:  Negative for flank pain.  Musculoskeletal:  Negative for back pain.  Skin:  Negative for rash.  Neurological:  Negative for headaches.   Physical Exam Updated Vital Signs BP (!) 125/92   Pulse 90   Temp 99 F (37.2 C) (Oral)   Resp 20   Ht 5\' 7"  (1.702 m)   Wt 59 kg   LMP 12/28/2020 (Approximate)   SpO2 97%   BMI 20.36 kg/m   Physical Exam Constitutional:      General: She is not in acute distress.    Appearance: Normal appearance.  HENT:     Head: Normocephalic.     Nose: Nose normal.  Eyes:     Extraocular Movements: Extraocular movements intact.  Cardiovascular:     Rate and Rhythm: Normal rate.  Pulmonary:     Effort: Pulmonary effort is normal. No tachypnea or accessory muscle usage.     Breath sounds: No decreased breath sounds or wheezing.  Musculoskeletal:        General: Normal range of motion.     Cervical back: Normal range of motion.  Neurological:     General: No focal deficit present.     Mental Status: She is alert. Mental status is at baseline.    ED Results / Procedures / Treatments   Labs (all labs ordered are listed, but only abnormal results are displayed) Labs Reviewed  RESP PANEL BY RT-PCR (FLU A&B, COVID) ARPGX2  CBC WITH DIFFERENTIAL/PLATELET  BASIC METABOLIC PANEL  D-DIMER, QUANTITATIVE  TROPONIN I (HIGH SENSITIVITY)    EKG EKG Interpretation  Date/Time:  Tuesday January 16 2021 21:30:58 EDT Ventricular Rate:  97 PR Interval:  153 QRS Duration: 103 QT Interval:  345 QTC Calculation: 439 R Axis:   110 Text Interpretation: Right and left arm electrode reversal, interpretation assumes no reversal Sinus rhythm Consider right  ventricular hypertrophy Abnormal T, consider ischemia, lateral leads Confirmed by [id number]) on 01/16/2021 10:27:00 PM  Radiology DG Chest Port 1 View  Result Date: 01/16/2021 CLINICAL DATA:  Shortness of breath.  History of asthma. EXAM: PORTABLE CHEST 1 VIEW COMPARISON:  04/12/2020 FINDINGS: Mild hyperinflationthe cardiomediastinal contours are normal. No evidence of pneumomediastinum. Pulmonary vasculature is normal. No consolidation, pleural effusion, or pneumothorax. No acute osseous abnormalities are seen. IMPRESSION: Mild hyperinflation without localizing process. Electronically Signed   By: 04/14/2020 M.D.   On: 01/16/2021 23:07  Procedures Procedures   Medications Ordered in ED Medications - No data to display  ED Course  I have reviewed the triage vital signs and the nursing notes.  Pertinent labs & imaging results that were available during my care of the patient were reviewed by me and considered in my medical decision making (see chart for details).    MDM Rules/Calculators/A&P                           Vital signs are within normal limits mildly tachycardic.  Lung sounds are clear on exam no history crackles noted.  Patient is mildly tachycardic with shortness of breath no wheezes etiology is unclear.  D-dimer sent and negative.  No unilateral leg swelling or edema noted.  Labs and chest x-ray otherwise unremarkable as well.  COVID test sent and pending.  Advised patient to follow-up with a primary care doctor within 3 to 4 days.  Advising immediate return for worsening symptoms difficulty breathing or any additional concerns.  Final Clinical Impression(s) / ED Diagnoses Final diagnoses:  Shortness of breath    Rx / DC Orders ED Discharge Orders     None        Cheryll Cockayne, MD 01/16/21 2311

## 2021-01-16 NOTE — ED Notes (Signed)
When pt was called back to be triaged, pt stated that she was tired of waiting and was going to leave and try to go someone else. Pt was not triaged or seen by a provider.

## 2021-01-16 NOTE — Discharge Instructions (Signed)
Call your primary care doctor or specialist as discussed in the next 2-3 days.   Return immediately back to the ER if:  Your symptoms worsen within the next 12-24 hours. You develop new symptoms such as new fevers, persistent vomiting, new pain, shortness of breath, or new weakness or numbness, or if you have any other concerns.  

## 2021-01-16 NOTE — ED Triage Notes (Signed)
Patient here POV from Home with SOB.  Patient states she has a History of Asthma and has been having this Exacerbation for approximately 1 week but is has been at its worst yesterday and today.  Patient has been using Albuterol Inhaler 2-3/Puffs at a time every 2-4 hours due to worsening in breathing.  NAD Noted. Ambulatory. GCS 15.

## 2021-01-17 LAB — URINE CULTURE: Culture: <10000 — AB

## 2021-02-16 ENCOUNTER — Emergency Department (HOSPITAL_COMMUNITY): Payer: PRIVATE HEALTH INSURANCE

## 2021-02-16 ENCOUNTER — Emergency Department (HOSPITAL_COMMUNITY)
Admission: EM | Admit: 2021-02-16 | Discharge: 2021-02-16 | Disposition: A | Discharge status: Home / Self Care | Payer: PRIVATE HEALTH INSURANCE | Attending: Emergency Medicine | Admitting: Emergency Medicine

## 2021-02-16 ENCOUNTER — Other Ambulatory Visit: Payer: Self-pay

## 2021-02-16 DIAGNOSIS — Z79899 Other long term (current) drug therapy: Secondary | ICD-10-CM | POA: Insufficient documentation

## 2021-02-16 DIAGNOSIS — M542 Cervicalgia: Secondary | ICD-10-CM | POA: Diagnosis not present

## 2021-02-16 DIAGNOSIS — Z20822 Contact with and (suspected) exposure to covid-19: Secondary | ICD-10-CM | POA: Insufficient documentation

## 2021-02-16 DIAGNOSIS — T1490XA Injury, unspecified, initial encounter: Secondary | ICD-10-CM

## 2021-02-16 DIAGNOSIS — R109 Unspecified abdominal pain: Secondary | ICD-10-CM | POA: Diagnosis not present

## 2021-02-16 DIAGNOSIS — S0990XA Unspecified injury of head, initial encounter: Secondary | ICD-10-CM | POA: Diagnosis present

## 2021-02-16 DIAGNOSIS — S0081XA Abrasion of other part of head, initial encounter: Secondary | ICD-10-CM | POA: Insufficient documentation

## 2021-02-16 DIAGNOSIS — J45909 Unspecified asthma, uncomplicated: Secondary | ICD-10-CM | POA: Insufficient documentation

## 2021-02-16 DIAGNOSIS — Z7951 Long term (current) use of inhaled steroids: Secondary | ICD-10-CM | POA: Insufficient documentation

## 2021-02-16 DIAGNOSIS — S0001XA Abrasion of scalp, initial encounter: Secondary | ICD-10-CM | POA: Diagnosis not present

## 2021-02-16 LAB — CBC
HCT: 42.8 % (ref 36.0–46.0)
Hemoglobin: 13.5 g/dL (ref 12.0–15.0)
MCH: 29.7 pg (ref 26.0–34.0)
MCHC: 31.5 g/dL (ref 30.0–36.0)
MCV: 94.1 fL (ref 80.0–100.0)
Platelets: 211 10*3/uL (ref 150–400)
RBC: 4.55 MIL/uL (ref 3.87–5.11)
RDW: 11.9 % (ref 11.5–15.5)
WBC: 4.3 10*3/uL (ref 4.0–10.5)
nRBC: 0 % (ref 0.0–0.2)

## 2021-02-16 LAB — COMPREHENSIVE METABOLIC PANEL
ALT: 9 U/L (ref 0–44)
AST: 22 U/L (ref 15–41)
Albumin: 3.5 g/dL (ref 3.5–5.0)
Alkaline Phosphatase: 51 U/L (ref 38–126)
Anion gap: 10 (ref 5–15)
BUN: 5 mg/dL — ABNORMAL LOW (ref 6–20)
CO2: 22 mmol/L (ref 22–32)
Calcium: 9.1 mg/dL (ref 8.9–10.3)
Chloride: 104 mmol/L (ref 98–111)
Creatinine, Ser: 0.71 mg/dL (ref 0.44–1.00)
GFR, Estimated: >60 mL/min (ref >60)
Glucose, Bld: 108 mg/dL — ABNORMAL HIGH (ref 70–99)
Potassium: 3.6 mmol/L (ref 3.5–5.1)
Sodium: 136 mmol/L (ref 135–145)
Total Bilirubin: 0.6 mg/dL (ref 0.3–1.2)
Total Protein: 6.6 g/dL (ref 6.5–8.1)

## 2021-02-16 LAB — RESP PANEL BY RT-PCR (FLU A&B, COVID) ARPGX2
Influenza A by PCR: NEGATIVE
Influenza B by PCR: NEGATIVE
SARS Coronavirus 2 by RT PCR: NEGATIVE

## 2021-02-16 LAB — I-STAT CHEM 8, ED
BUN: 6 mg/dL (ref 6–20)
Calcium, Ion: 1.12 mmol/L — ABNORMAL LOW (ref 1.15–1.40)
Chloride: 104 mmol/L (ref 98–111)
Creatinine, Ser: 0.5 mg/dL (ref 0.44–1.00)
Glucose, Bld: 113 mg/dL — ABNORMAL HIGH (ref 70–99)
HCT: 42 % (ref 36.0–46.0)
Hemoglobin: 14.3 g/dL (ref 12.0–15.0)
Potassium: 3.7 mmol/L (ref 3.5–5.1)
Sodium: 139 mmol/L (ref 135–145)
TCO2: 24 mmol/L (ref 22–32)

## 2021-02-16 LAB — I-STAT BETA HCG BLOOD, ED (MC, WL, AP ONLY): I-stat hCG, quantitative: <5 m[IU]/mL (ref <5)

## 2021-02-16 LAB — URINALYSIS, ROUTINE W REFLEX MICROSCOPIC
Bilirubin Urine: NEGATIVE
Glucose, UA: NEGATIVE mg/dL
Hgb urine dipstick: NEGATIVE
Ketones, ur: NEGATIVE mg/dL
Leukocytes,Ua: NEGATIVE
Nitrite: NEGATIVE
Protein, ur: NEGATIVE mg/dL
Specific Gravity, Urine: 1.004 — ABNORMAL LOW (ref 1.005–1.030)
pH: 7 (ref 5.0–8.0)

## 2021-02-16 LAB — LACTIC ACID, PLASMA
Lactic Acid, Venous: 3.8 mmol/L (ref 0.5–1.9)
Lactic Acid, Venous: 1.4 mmol/L (ref 0.5–1.9)

## 2021-02-16 LAB — PROTIME-INR
INR: 1 (ref 0.8–1.2)
Prothrombin Time: 12.8 seconds (ref 11.4–15.2)

## 2021-02-16 LAB — ETHANOL: Alcohol, Ethyl (B): <10 mg/dL (ref <10)

## 2021-02-16 LAB — SAMPLE TO BLOOD BANK

## 2021-02-16 MED ORDER — KETOROLAC TROMETHAMINE 15 MG/ML IJ SOLN
15.0000 mg | Freq: Once | INTRAMUSCULAR | Status: AC
  Administered 2021-02-16: 15 mg via INTRAVENOUS
  Filled 2021-02-16: qty 1

## 2021-02-16 MED ORDER — SODIUM CHLORIDE 0.9 % IV BOLUS
1000.0000 mL | Freq: Once | INTRAVENOUS | Status: AC
  Administered 2021-02-16: 1000 mL via INTRAVENOUS

## 2021-02-16 MED ORDER — METHOCARBAMOL 500 MG PO TABS
500.0000 mg | ORAL_TABLET | Freq: Once | ORAL | Status: AC
  Administered 2021-02-16: 500 mg via ORAL
  Filled 2021-02-16: qty 1

## 2021-02-16 MED ORDER — MORPHINE SULFATE (PF) 4 MG/ML IV SOLN: 4.0000 mg | Freq: Once | INTRAVENOUS | Status: DC

## 2021-02-16 MED ORDER — METHOCARBAMOL 500 MG PO TABS: 500.0000 mg | ORAL_TABLET | Freq: Three times a day (TID) | ORAL | 0 refills | Status: AC | PRN

## 2021-02-16 MED ORDER — BACITRACIN ZINC 500 UNIT/GM EX OINT: 1.0000 "application " | TOPICAL_OINTMENT | Freq: Two times a day (BID) | CUTANEOUS | 0 refills | Status: DC

## 2021-02-16 MED ORDER — IOHEXOL 300 MG/ML  SOLN
100.0000 mL | Freq: Once | INTRAMUSCULAR | Status: AC | PRN
  Administered 2021-02-16: 100 mL via INTRAVENOUS

## 2021-02-16 MED ORDER — ONDANSETRON HCL 4 MG/2ML IJ SOLN
4.0000 mg | Freq: Once | INTRAMUSCULAR | Status: AC
  Administered 2021-02-16: 4 mg via INTRAVENOUS
  Filled 2021-02-16: qty 2

## 2021-02-16 MED ORDER — LACTATED RINGERS IV BOLUS: 1000.0000 mL | Freq: Once | INTRAVENOUS | Status: DC

## 2021-02-16 NOTE — ED Notes (Signed)
Patient transported to CT (CT delayed d/t multiple pts in scanner and one scanner rebooting)
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
## 2021-02-16 NOTE — Progress Notes (Signed)
Orthopedic Tech Progress Note Patient Details:  Alexis [name] 05/27/1877 [id number] Level 2 Trauma  Patient ID: [place] Bb [name], female   DOB: 05/27/1877, 20 y.o.   MRN: [id number]  Smitty Pluck 02/16/2021, 8:48 AM

## 2021-02-16 NOTE — ED Notes (Signed)
Patient discharge instructions reviewed with the patient. The patient verbalized understanding of instructions. Patient discharged. 

## 2021-02-16 NOTE — Discharge Instructions (Addendum)
Keep area of wounds clean and dry.  There is also a prescription for an antibiotic ointment.  To minimize scarring, avoid direct sunlight during the healing process.  You will likely have continued soreness tomorrow.  Treat pain with ibuprofen and Tylenol.  There is a prescription for a muscle relaxer attached.  Take this as needed for muscle pain.  Be advised that this medication can cause drowsiness.  Take with caution.  You also likely sustained a concussion.  Postconcussive syndrome instructions are attached.  Please follow-up with your primary care doctor for concussion follow-up.
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
## 2021-04-16 ENCOUNTER — Encounter: Payer: Self-pay | Admitting: Allergy

## 2021-04-16 ENCOUNTER — Ambulatory Visit (INDEPENDENT_AMBULATORY_CARE_PROVIDER_SITE_OTHER): Payer: Medicaid Other | Admitting: Allergy

## 2021-04-16 ENCOUNTER — Other Ambulatory Visit: Payer: Self-pay

## 2021-04-16 VITALS — BP 118/80 | HR 127 | Temp 98.0°F | Resp 20 | Ht 67.0 in | Wt 141.4 lb

## 2021-04-16 DIAGNOSIS — T781XXD Other adverse food reactions, not elsewhere classified, subsequent encounter: Secondary | ICD-10-CM | POA: Insufficient documentation

## 2021-04-16 DIAGNOSIS — T7840XA Allergy, unspecified, initial encounter: Secondary | ICD-10-CM | POA: Diagnosis not present

## 2021-04-16 DIAGNOSIS — J3089 Other allergic rhinitis: Secondary | ICD-10-CM | POA: Diagnosis not present

## 2021-04-16 DIAGNOSIS — J45909 Unspecified asthma, uncomplicated: Secondary | ICD-10-CM | POA: Diagnosis not present

## 2021-04-16 MED ORDER — FLUTICASONE-SALMETEROL 250-50 MCG/ACT IN AEPB: 1.0000 | INHALATION_SPRAY | Freq: Two times a day (BID) | RESPIRATORY_TRACT | 3 refills | Status: AC
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
## 2021-04-16 NOTE — Patient Instructions (Addendum)
Today's skin testing showed: Positive to dust mites. Borderline positive to ragweed.   Asthma: Daily controller medication(s): take Wixela 1 puff TWICE a day and rinse mouth after each use. Stop Singulair.  May use albuterol rescue inhaler 2 puffs every 4 to 6 hours as needed for shortness of breath, chest tightness, coughing, and wheezing. May use albuterol rescue inhaler 2 puffs 5 to 15 minutes prior to strenuous physical activities. Monitor frequency of use.  Asthma control goals:  Full participation in all desired activities (may need albuterol before activity) Albuterol use two times or less a week on average (not counting use with activity) Cough interfering with sleep two times or less a month Oral steroids no more than once a year No hospitalizations   Environmental allergies Start environmental control measures as below. Use over the counter antihistamines such as Zyrtec (cetirizine), Claritin (loratadine), Allegra (fexofenadine), or Xyzal (levocetirizine) daily as needed. May take twice a day during allergy flares. May switch antihistamines every few months. Use Flonase (fluticasone) nasal spray 1 spray per nostril twice a day as needed for nasal congestion.  Nasal saline spray (i.e., Simply Saline) or nasal saline lavage (i.e., NeilMed) is recommended as needed and prior to medicated nasal sprays.  Food: Continue to avoid tomato and citrus fruits - this is most likely an intolerance rather than an allergy.  Follow up in 3 months or sooner if needed.    Control of House Dust Mite Allergen Dust mite allergens are a common trigger of allergy and asthma symptoms. While they can be found throughout the house, these microscopic creatures thrive in warm, humid environments such as bedding, upholstered furniture and carpeting. Because so much time is spent in the bedroom, it is essential to reduce mite levels there.  Encase pillows, mattresses, and box springs in special allergen-proof  fabric covers or airtight, zippered plastic covers.  Bedding should be washed weekly in hot water (130 F) and dried in a hot dryer. Allergen-proof covers are available for comforters and pillows that can't be regularly washed.  Wash the allergy-proof covers every few months. Minimize clutter in the bedroom. Keep pets out of the bedroom.  Keep humidity less than 50% by using a dehumidifier or air conditioning. You can buy a humidity measuring device called a hygrometer to monitor this.  If possible, replace carpets with hardwood, linoleum, or washable area rugs. If that's not possible, vacuum frequently with a vacuum that has a HEPA filter. Remove all upholstered furniture and non-washable window drapes from the bedroom. Remove all non-washable stuffed toys from the bedroom.  Wash stuffed toys weekly.  Reducing Pollen Exposure Pollen seasons: trees (spring), grass (summer) and ragweed/weeds (fall). Keep windows closed in your home and car to lower pollen exposure.  Install air conditioning in the bedroom and throughout the house if possible.  Avoid going out in dry windy days - especially early morning. Pollen counts are highest between 5 - 10 AM and on dry, hot and windy days.  Save outside activities for late afternoon or after a heavy rain, when pollen levels are lower.  Avoid mowing of grass if you have grass pollen allergy. Be aware that pollen can also be transported indoors on people and pets.  Dry your clothes in an automatic dryer rather than hanging them outside where they might collect pollen.  Rinse hair and eyes before bedtime.

## 2021-04-16 NOTE — Assessment & Plan Note (Signed)
Perennial rhinitis symptoms mainly in the mornings.  Tried Flonase and Zyrtec with some benefit.  No prior allergy/ENT evaluation.  Today skin testing was positive to dust mites and borderline positive to ragweed.  Start environmental control measures as below.  Use over the counter antihistamines such as Zyrtec (cetirizine), Claritin (loratadine), Allegra (fexofenadine), or Xyzal (levocetirizine) daily as needed. May take twice a day during allergy flares. May switch antihistamines every few months. . Use Flonase (fluticasone) nasal spray 1 spray per nostril twice a day as needed for nasal congestion.  . Nasal saline spray (i.e., Simply Saline) or nasal saline lavage (i.e., NeilMed) is recommended as needed and prior to medicated nasal sprays.

## 2021-04-16 NOTE — Assessment & Plan Note (Signed)
Tomato and citrus fruits cause bladder pain.  Today's skin testing was negative to select foods. . Continue to avoid tomato and citrus fruits - this is most likely an intolerance rather than an allergy.

## 2021-04-20 ENCOUNTER — Emergency Department (INDEPENDENT_AMBULATORY_CARE_PROVIDER_SITE_OTHER)
Admission: EM | Admit: 2021-04-20 | Discharge: 2021-04-20 | Disposition: A | Discharge status: Home / Self Care | Payer: Medicaid Other | Source: Home / Self Care | Attending: Family Medicine | Admitting: Family Medicine

## 2021-04-20 ENCOUNTER — Emergency Department: Admit: 2021-04-20 | Discharge status: Absent without leave | Payer: Self-pay

## 2021-04-20 ENCOUNTER — Other Ambulatory Visit (HOSPITAL_COMMUNITY)
Admission: RE | Admit: 2021-04-20 | Discharge: 2021-04-20 | Disposition: A | Discharge status: Home / Self Care | Payer: Medicaid Other | Source: Ambulatory Visit | Attending: Family Medicine | Admitting: Family Medicine

## 2021-04-20 DIAGNOSIS — R300 Dysuria: Secondary | ICD-10-CM | POA: Insufficient documentation

## 2021-04-20 DIAGNOSIS — B3731 Acute candidiasis of vulva and vagina: Secondary | ICD-10-CM

## 2021-04-20 LAB — POCT URINALYSIS DIP (MANUAL ENTRY)
Bilirubin, UA: NEGATIVE
Glucose, UA: NEGATIVE mg/dL
Ketones, POC UA: NEGATIVE mg/dL
Nitrite, UA: NEGATIVE
Protein Ur, POC: NEGATIVE mg/dL
Spec Grav, UA: 1.015 (ref 1.010–1.025)
Urobilinogen, UA: 0.2 E.U./dL
pH, UA: 7 (ref 5.0–8.0)

## 2021-04-20 MED ORDER — FLUCONAZOLE 150 MG PO TABS: 150.0000 mg | ORAL_TABLET | Freq: Once | ORAL | 0 refills | Status: AC

## 2021-04-20 NOTE — ED Provider Notes (Signed)
[name]    CSN: [id number] Arrival date & time: 04/20/21  1137      History   Chief Complaint Chief Complaint  Patient presents with   Vaginitis   Dysuria    HPI Alexis [name] is a 20 y.o. female.   Patient just finished a one week course of amoxicillin for a dental problem.  During the past week she has developed mild dysuria, vaginal discharge, labial itching and mild swelling.  She denies pelvic/abdominal pain.  Patient's last menstrual period was 03/23/2021 (approximate).  She has no concern for possible STD.    The history is provided by the patient.   Past Medical History:  Diagnosis Date   Anxiety    Asthma    Bipolar disorder ([place])    per pt   Depression    GERD (gastroesophageal reflux disease)    Migraines    Pelvic floor dysfunction    UTI (urinary tract infection)     Patient Active Problem List   Diagnosis Date Noted   Other allergic rhinitis 04/16/2021   Other adverse food reactions, not elsewhere classified, subsequent encounter 04/16/2021   Asthma 04/16/2021   Acute encephalopathy    Gabapentin overdose    Chronic respiratory failure ([place])    Overdose 04/09/2020   MDD (major depressive disorder), recurrent severe, without psychosis ([place]) 04/19/2018   Suicide attempt by benzodiazepine overdose ([place]) 04/19/2018    Past Surgical History:  Procedure Laterality Date   CYSTO WITH HYDRODISTENSION N/A 04/10/2018   Procedure: CYSTOSCOPY/HYDRODISTENSION;  Surgeon: Ardis Hughs, MD;  Location: [facility];  Service: Urology;  Laterality: N/A;   UPPER GI ENDOSCOPY      OB History   No obstetric history on file.      Home Medications    Prior to Admission medications   Medication Sig Start Date End Date Taking? Authorizing Provider  fluconazole (DIFLUCAN) 150 MG tablet Take 1 tablet (150 mg total) by mouth once for 1 dose. May repeat in 72 hours. 04/20/21 04/20/21 Yes Kandra Nicolas, MD  albuterol  (PROVENTIL HFA;VENTOLIN HFA) 108 (90 Base) MCG/ACT inhaler Inhale 1-2 puffs into the lungs every 6 (six) hours as needed for wheezing or shortness of breath. 04/22/18   Lindell Spar I, NP  cetirizine (ZYRTEC) 10 MG tablet Take 10 mg by mouth daily. 11/01/20   [provider]  diazepam (VALIUM) 5 MG tablet Take 1 tablet (5 mg total) by mouth every 8 (eight) hours. 04/16/20   Sharma Covert, MD  fluticasone-salmeterol [facility] INHUB) 250-50 MCG/ACT AEPB Inhale 1 puff into the lungs in the morning and at bedtime. Rinse mouth after each use. 04/16/21   Garnet Sierras, DO  gabapentin (NEURONTIN) 100 MG capsule Take 1 capsule (100 mg total) by mouth as needed (anxiety). 04/16/20   Sharma Covert, MD  hydrOXYzine (ATARAX/VISTARIL) 25 MG tablet Take 1 tablet (25 mg total) by mouth every 8 (eight) hours as needed for anxiety. 04/16/20   Sharma Covert, MD  lamoTRIgine (LAMICTAL) 100 MG tablet Take 1 tablet (100 mg total) by mouth daily. 04/16/20   Sharma Covert, MD  methocarbamol (ROBAXIN) 500 MG tablet Take 1 tablet (500 mg total) by mouth every 8 (eight) hours as needed for up to 6 doses for muscle spasms. 02/16/21   Godfrey Pick, MD  Norgestimate-Ethinyl Estradiol Triphasic (TRI-LO-MARZIA) 0.18/0.215/0.25 MG-25 MCG tab Take 1 tablet by mouth at bedtime. Birth control method 04/22/18   Encarnacion Slates, NP  PARoxetine (PAXIL) 10 MG tablet Take 10 mg by mouth daily.    [provider]  phenazopyridine (PYRIDIUM) 100 MG tablet Take 1 tablet (100 mg total) by mouth 3 (three) times daily. For bladder spasms Patient taking differently: Take 100 mg by mouth as needed for pain (bladder spasms). 04/22/18   Armandina Stammer I, NP  polyethylene glycol (MIRALAX / GLYCOLAX) 17 g packet Take 17 g by mouth daily as needed for mild constipation. 04/16/20   Antonieta Pert, MD  QUEtiapine Fumarate (SEROQUEL PO) Take 25 mg by mouth.    [provider]    Family History Family History   Problem Relation Age of Onset   Asthma Mother    Thyroid disease Mother    Healthy Sister     Social History Social History   Tobacco Use   Smoking status: Never   Smokeless tobacco: Never  Vaping Use   Vaping Use: Never used  Substance Use Topics   Alcohol use: Yes    Comment: socially   Drug use: Never     Allergies   Other and Tomato   Review of Systems Review of Systems  Constitutional:  Negative for activity change, appetite change, chills, diaphoresis, fatigue and fever.  Gastrointestinal:  Negative for abdominal pain and nausea.  Genitourinary:  Positive for dysuria and vaginal discharge. Negative for flank pain, frequency, genital sores, hematuria, pelvic pain, urgency, vaginal bleeding and vaginal pain.  All other systems reviewed and are negative.   Physical Exam Triage Vital Signs ED Triage Vitals [04/20/21 1402]  Enc Vitals Group     BP      Pulse      Resp      Temp      Temp src      SpO2      Weight      Height      Head Circumference      Peak Flow      Pain Score 9     Pain Loc      Pain Edu?      Excl. in GC?    No data found.  Updated Vital Signs LMP 03/23/2021 (Approximate)   Visual Acuity Right Eye Distance:   Left Eye Distance:   Bilateral Distance:    Right Eye Near:   Left Eye Near:    Bilateral Near:     Physical Exam Nursing notes reviewed. Appearance:  Patient appears stated age, and in no acute distress.    Eyes:  Pupils are equal, round, and reactive to light and accomodation.  Extraocular movement is intact.  Conjunctivae are not inflamed   Pharynx:  Normal; moist mucous membranes  Neck:  Supple.  No adenopathy Lungs:  Clear to auscultation.  Breath sounds are equal.  Moving air well. Heart:  Regular rate and rhythm without murmurs, rubs, or gallops.  Abdomen:  Nontender without masses or hepatosplenomegaly.  Bowel sounds are present.  No CVA or flank tenderness.  Extremities:  No edema.  Skin:  No rash  present.     UC Treatments / Results  Labs (all labs ordered are listed, but only abnormal results are displayed) Labs Reviewed  POCT URINALYSIS DIP (MANUAL ENTRY) - Abnormal; Notable for the following components:      Result Value   Blood, UA small (*)    Leukocytes, UA Trace (*)    All other components within normal limits  URINE CULTURE    EKG   Radiology No results  found.  Procedures Procedures (including critical care time)  Medications Ordered in UC Medications - No data to display  Initial Impression / Assessment and Plan / UC Course  I have reviewed the triage vital signs and the nursing notes.  Pertinent labs & imaging results that were available during my care of the patient were reviewed by me and considered in my medical decision making (see chart for details).    Rx for Diflucan (two tabs).  Will check for BV and candida. Followup with Family Doctor if not improved in one week.   Final Clinical Impressions(s) / UC Diagnoses   Final diagnoses:  Dysuria  Candida vaginitis     Discharge Instructions      May apply Desitin cream to external irritated areas.    ED Prescriptions     Medication Sig Dispense Auth. Provider   fluconazole (DIFLUCAN) 150 MG tablet Take 1 tablet (150 mg total) by mouth once for 1 dose. May repeat in 72 hours. 2 tablet Kandra Nicolas, MD         Kandra Nicolas, MD 04/23/21 [phone]

## 2021-04-20 NOTE — Discharge Instructions (Signed)
May apply Desitin cream to external irritated areas.

## 2021-04-20 NOTE — ED Triage Notes (Addendum)
Pt c/o sxs of yeast infection. Finished amoxicillin TID  on Monday. Also having some dysuria. Pain 9/10

## 2021-04-21 LAB — URINE CULTURE
MICRO NUMBER:: 12680490
Result:: NO GROWTH
SPECIMEN QUALITY:: ADEQUATE

## 2021-04-23 LAB — CERVICOVAGINAL ANCILLARY ONLY
Bacterial Vaginitis (gardnerella): NEGATIVE
Candida Glabrata: NEGATIVE
Candida Vaginitis: POSITIVE — AB
Comment: NEGATIVE
Comment: NEGATIVE
Comment: NEGATIVE

## 2021-07-23 ENCOUNTER — Ambulatory Visit: Payer: Medicaid Other | Admitting: Allergy
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
## 2021-11-24 ENCOUNTER — Ambulatory Visit
Admission: EM | Admit: 2021-11-24 | Discharge: 2021-11-24 | Disposition: A | Discharge status: Home / Self Care | Payer: Medicaid Other | Attending: Internal Medicine | Admitting: Internal Medicine

## 2021-11-24 DIAGNOSIS — R350 Frequency of micturition: Secondary | ICD-10-CM

## 2021-11-24 DIAGNOSIS — Z113 Encounter for screening for infections with a predominantly sexual mode of transmission: Secondary | ICD-10-CM | POA: Diagnosis not present

## 2021-11-24 DIAGNOSIS — N898 Other specified noninflammatory disorders of vagina: Secondary | ICD-10-CM | POA: Diagnosis not present

## 2021-11-24 LAB — POCT URINALYSIS DIP (MANUAL ENTRY)
Bilirubin, UA: NEGATIVE
Glucose, UA: NEGATIVE mg/dL
Ketones, POC UA: NEGATIVE mg/dL
Leukocytes, UA: NEGATIVE
Nitrite, UA: NEGATIVE
Protein Ur, POC: NEGATIVE mg/dL
Spec Grav, UA: 1.015 (ref 1.010–1.025)
Urobilinogen, UA: 0.2 E.U./dL
pH, UA: 8.5 — AB (ref 5.0–8.0)

## 2021-11-24 LAB — POCT URINE PREGNANCY: Preg Test, Ur: NEGATIVE

## 2021-11-24 NOTE — Discharge Instructions (Signed)
Your urine test did not show signs of urinary tract infection.  Urine pregnancy was negative.  Your vaginal swab is pending.  We will await results for treatment and treat as appropriate once results are complete.  Please refrain from sexual activity until test results and treatment are complete.

## 2021-11-24 NOTE — ED Triage Notes (Signed)
Pt presents with vaginal itching and abnormal discharge X 3 days.

## 2021-11-24 NOTE — ED Provider Notes (Signed)
[name]    CSN: [id number] Arrival date & time: 11/24/21  1504      History   Chief Complaint Chief Complaint  Patient presents with   Vaginal Itching    Yeast infection - Entered by patient    HPI Alexis [name] is a 21 y.o. female.   Patient presents with vaginal itching and vaginal discharge that is white in color that started about 3 days ago.  Has some lower abdominal cramping and urinary frequency as well but denies any urinary burning, urinary urgency, back pain, fever, abnormal vaginal bleeding.  Denies any known exposure to STD but is sexually active.   Vaginal Itching    Past Medical History:  Diagnosis Date   Anxiety    Asthma    Bipolar disorder ([facility])    per pt   Depression    GERD (gastroesophageal reflux disease)    Migraines    Pelvic floor dysfunction    UTI (urinary tract infection)     Patient Active Problem List   Diagnosis Date Noted   Other allergic rhinitis 04/16/2021   Other adverse food reactions, not elsewhere classified, subsequent encounter 04/16/2021   Asthma 04/16/2021   Acute encephalopathy    Gabapentin overdose    Chronic respiratory failure ([facility])    Overdose 04/09/2020   MDD (major depressive disorder), recurrent severe, without psychosis ([facility]) 04/19/2018   Suicide attempt by benzodiazepine overdose ([facility]) 04/19/2018    Past Surgical History:  Procedure Laterality Date   CYSTO WITH HYDRODISTENSION N/A 04/10/2018   Procedure: CYSTOSCOPY/HYDRODISTENSION;  Surgeon: Crist Fat, MD;  Location[facility];  Service: Urology;  Laterality: N/A;   UPPER GI ENDOSCOPY      OB History   No obstetric history on file.      Home Medications    Prior to Admission medications   Medication Sig Start Date End Date Taking? Authorizing Provider  albuterol (PROVENTIL HFA;VENTOLIN HFA) 108 (90 Base) MCG/ACT inhaler Inhale 1-2 puffs into the lungs every 6 (six) hours as needed for wheezing or  shortness of breath. 04/22/18   Armandina Stammer I, NP  cetirizine (ZYRTEC) 10 MG tablet Take 10 mg by mouth daily. 11/01/20   [provider]  diazepam (VALIUM) 5 MG tablet Take 1 tablet (5 mg total) by mouth every 8 (eight) hours. 04/16/20   Antonieta Pert, MD  fluticasone-salmeterol [facility] INHUB) 250-50 MCG/ACT AEPB Inhale 1 puff into the lungs in the morning and at bedtime. Rinse mouth after each use. 04/16/21   Ellamae Sia, DO  gabapentin (NEURONTIN) 100 MG capsule Take 1 capsule (100 mg total) by mouth as needed (anxiety). 04/16/20   Antonieta Pert, MD  hydrOXYzine (ATARAX/VISTARIL) 25 MG tablet Take 1 tablet (25 mg total) by mouth every 8 (eight) hours as needed for anxiety. 04/16/20   Antonieta Pert, MD  lamoTRIgine (LAMICTAL) 100 MG tablet Take 1 tablet (100 mg total) by mouth daily. 04/16/20   Antonieta Pert, MD  methocarbamol (ROBAXIN) 500 MG tablet Take 1 tablet (500 mg total) by mouth every 8 (eight) hours as needed for up to 6 doses for muscle spasms. 02/16/21   Gloris Manchester, MD  Norgestimate-Ethinyl Estradiol Triphasic (TRI-LO-MARZIA) 0.18/0.215/0.25 MG-25 MCG tab Take 1 tablet by mouth at bedtime. Birth control method 04/22/18   Armandina Stammer I, NP  PARoxetine (PAXIL) 10 MG tablet Take 10 mg by mouth daily.    [provider]  phenazopyridine (PYRIDIUM) 100 MG tablet Take  1 tablet (100 mg total) by mouth 3 (three) times daily. For bladder spasms Patient taking differently: Take 100 mg by mouth as needed for pain (bladder spasms). 04/22/18   Armandina Stammer I, NP  polyethylene glycol (MIRALAX / GLYCOLAX) 17 g packet Take 17 g by mouth daily as needed for mild constipation. 04/16/20   Antonieta Pert, MD  QUEtiapine Fumarate (SEROQUEL PO) Take 25 mg by mouth.    [provider]    Family History Family History  Problem Relation Age of Onset   Asthma Mother    Thyroid disease Mother    Healthy Sister     Social History Social History    Tobacco Use   Smoking status: Never   Smokeless tobacco: Never  Vaping Use   Vaping Use: Never used  Substance Use Topics   Alcohol use: Yes    Comment: socially   Drug use: Never     Allergies   Other and Tomato   Review of Systems Review of Systems Per HPI  Physical Exam Triage Vital Signs ED Triage Vitals  Enc Vitals Group     BP 11/24/21 1514 111/73     Pulse Rate 11/24/21 1514 99     Resp 11/24/21 1514 18     Temp 11/24/21 1514 98.2 F (36.8 C)     Temp Source 11/24/21 1514 Oral     SpO2 11/24/21 1514 97 %     Weight --      Height --      Head Circumference --      Peak Flow --      Pain Score 11/24/21 1517 0     Pain Loc --      Pain Edu? --      Excl. in GC? --    No data found.  Updated Vital Signs BP 111/73 (BP Location: Left Arm)   Pulse 99   Temp 98.2 F (36.8 C) (Oral)   Resp 18   LMP 11/13/2021   SpO2 97%   Visual Acuity Right Eye Distance:   Left Eye Distance:   Bilateral Distance:    Right Eye Near:   Left Eye Near:    Bilateral Near:     Physical Exam Constitutional:      General: She is not in acute distress.    Appearance: Normal appearance. She is not toxic-appearing or diaphoretic.  HENT:     Head: Normocephalic and atraumatic.  Eyes:     Extraocular Movements: Extraocular movements intact.     Conjunctiva/sclera: Conjunctivae normal.  Cardiovascular:     Rate and Rhythm: Normal rate and regular rhythm.     Pulses: Normal pulses.     Heart sounds: Normal heart sounds.  Pulmonary:     Effort: Pulmonary effort is normal. No respiratory distress.     Breath sounds: Normal breath sounds.  Abdominal:     General: Bowel sounds are normal. There is no distension.     Palpations: Abdomen is soft.     Tenderness: There is no abdominal tenderness.  Genitourinary:    Comments: Deferred with shared decision-making.  Self swab performed Neurological:     General: No focal deficit present.     Mental Status: She is alert  and oriented to person, place, and time. Mental status is at baseline.  Psychiatric:        Mood and Affect: Mood normal.        Behavior: Behavior normal.  Thought Content: Thought content normal.        Judgment: Judgment normal.      UC Treatments / Results  Labs (all labs ordered are listed, but only abnormal results are displayed) Labs Reviewed  POCT URINALYSIS DIP (MANUAL ENTRY) - Abnormal; Notable for the following components:      Result Value   Clarity, UA cloudy (*)    Blood, UA trace-intact (*)    pH, UA 8.5 (*)    All other components within normal limits  URINE CULTURE  POCT URINE PREGNANCY  CERVICOVAGINAL ANCILLARY ONLY    EKG   Radiology No results found.  Procedures Procedures (including critical care time)  Medications Ordered in UC Medications - No data to display  Initial Impression / Assessment and Plan / UC Course  I have reviewed the triage vital signs and the nursing notes.  Pertinent labs & imaging results that were available during my care of the patient were reviewed by me and considered in my medical decision making (see chart for details).     UA unremarkable.  Urine pregnancy negative.  Cervicovaginal swab pending.  Will await results for treatment.  Patient to refrain from sexual activity until test results and treatment are complete.  Patient verbalized understanding and was agreeable with plan. Final Clinical Impressions(s) / UC Diagnoses   Final diagnoses:  Vaginal itching  Vaginal discharge  Screening examination for venereal disease  Urinary frequency     Discharge Instructions      Your urine test did not show signs of urinary tract infection.  Urine pregnancy was negative.  Your vaginal swab is pending.  We will await results for treatment and treat as appropriate once results are complete.  Please refrain from sexual activity until test results and treatment are complete.     ED Prescriptions   None    PDMP  not reviewed this encounter.   Gustavus Bryant, [place] 11/24/21 1536

## 2021-11-25 LAB — URINE CULTURE: Culture: <10000 — AB

## 2021-11-26 LAB — CERVICOVAGINAL ANCILLARY ONLY
Bacterial Vaginitis (gardnerella): NEGATIVE
Candida Glabrata: NEGATIVE
Candida Vaginitis: NEGATIVE
Chlamydia: NEGATIVE
Comment: NEGATIVE
Comment: NEGATIVE
Comment: NEGATIVE
Comment: NEGATIVE
Comment: NEGATIVE
Comment: NORMAL
Neisseria Gonorrhea: NEGATIVE
Trichomonas: NEGATIVE

## 2022-08-28 IMAGING — DX DG CHEST 1V PORT
1 series · 1 of 1 positions shown · non-contrast
Comparison: None.

CLINICAL DATA: Drug overdose

EXAM:
PORTABLE CHEST 1 VIEW

[chest ap]
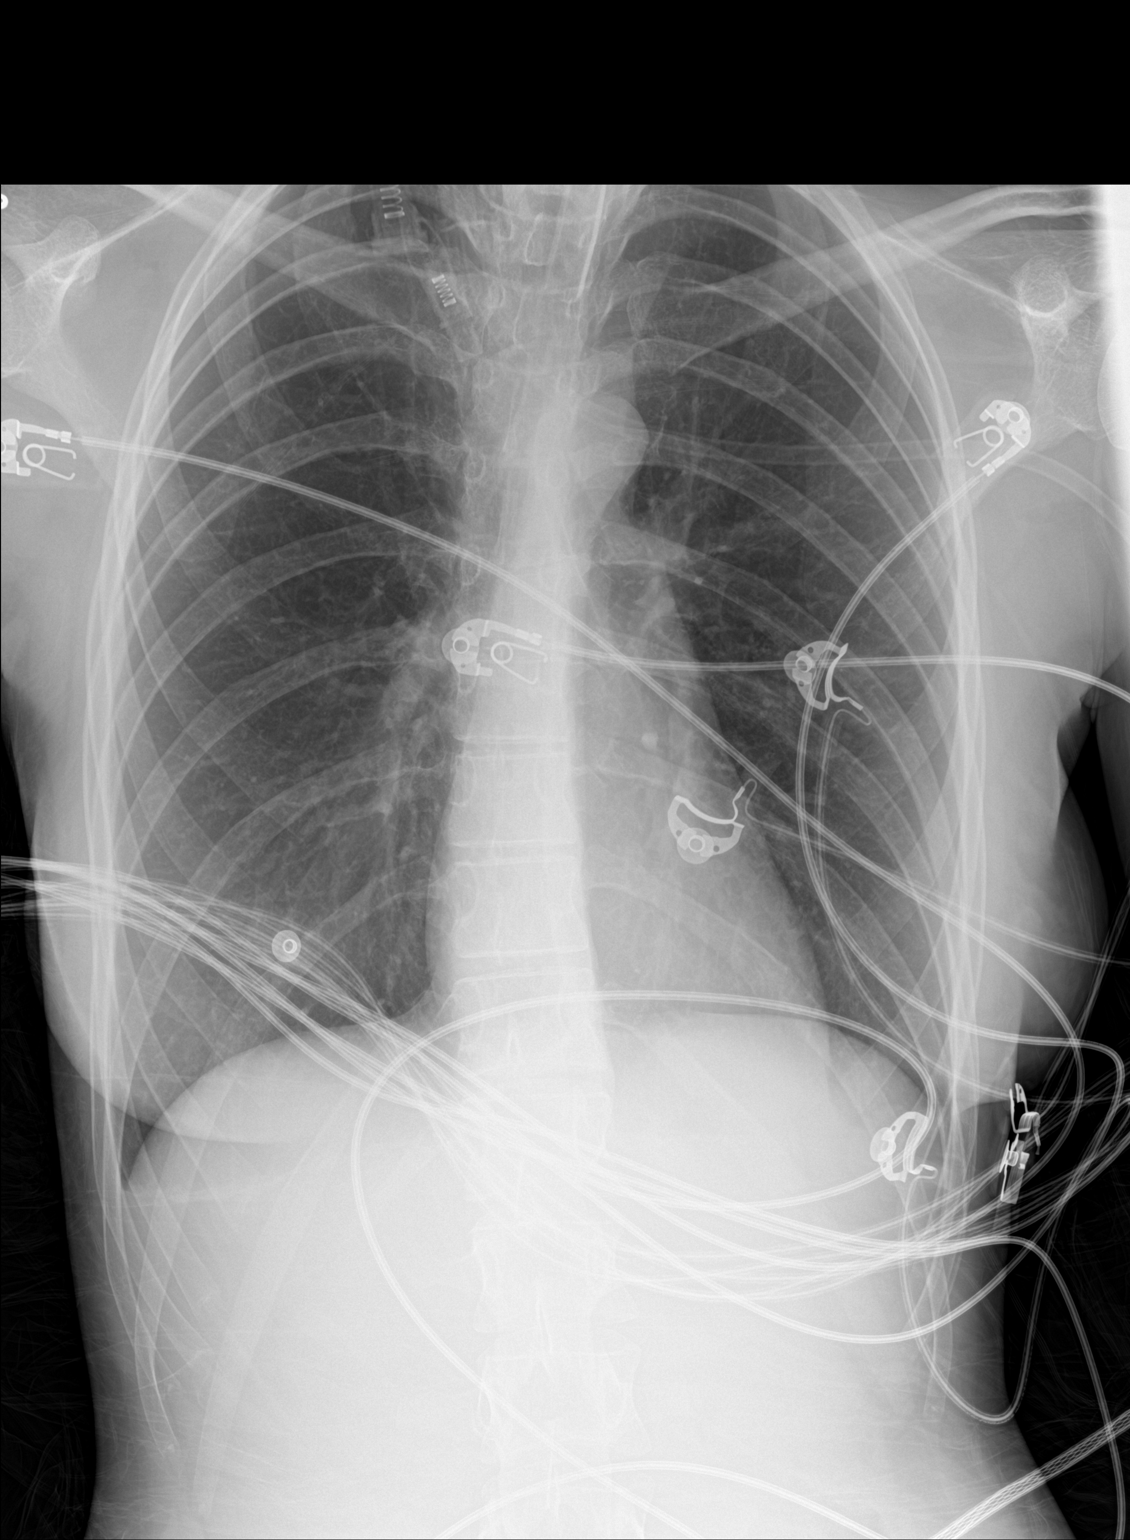

[1 of 1 positions shown; findings below may reference images not displayed]

FINDINGS: The heart size and mediastinal contours are within normal limits.
Both lungs are clear. The visualized skeletal structures are
unremarkable. Endotracheal tube tip at level of the clavicular
heads.
IMPRESSION: No active disease.
